# Patient Record
Sex: Male | Born: 1950 | Race: White | Hispanic: No | Marital: Married | Smoking: Current every day smoker
Health system: Southern US, Community
[De-identification: ages and names within clinical notes are randomized; demographics above are authoritative.]

## PROBLEM LIST (undated history)

## (undated) DIAGNOSIS — K449 Diaphragmatic hernia without obstruction or gangrene: Secondary | ICD-10-CM

## (undated) DIAGNOSIS — C4490 Unspecified malignant neoplasm of skin, unspecified: Secondary | ICD-10-CM

## (undated) DIAGNOSIS — K219 Gastro-esophageal reflux disease without esophagitis: Secondary | ICD-10-CM

## (undated) DIAGNOSIS — R911 Solitary pulmonary nodule: Secondary | ICD-10-CM

## (undated) DIAGNOSIS — E785 Hyperlipidemia, unspecified: Secondary | ICD-10-CM

## (undated) DIAGNOSIS — I714 Abdominal aortic aneurysm, without rupture, unspecified: Secondary | ICD-10-CM

## (undated) DIAGNOSIS — I10 Essential (primary) hypertension: Secondary | ICD-10-CM

## (undated) DIAGNOSIS — N182 Chronic kidney disease, stage 2 (mild): Secondary | ICD-10-CM

## (undated) DIAGNOSIS — K2270 Barrett's esophagus without dysplasia: Secondary | ICD-10-CM

## (undated) DIAGNOSIS — K5790 Diverticulosis of intestine, part unspecified, without perforation or abscess without bleeding: Secondary | ICD-10-CM

## (undated) DIAGNOSIS — IMO0002 Reserved for concepts with insufficient information to code with codable children: Secondary | ICD-10-CM

## (undated) DIAGNOSIS — M436 Torticollis: Secondary | ICD-10-CM

## (undated) DIAGNOSIS — M549 Dorsalgia, unspecified: Secondary | ICD-10-CM

## (undated) DIAGNOSIS — J439 Emphysema, unspecified: Secondary | ICD-10-CM

## (undated) DIAGNOSIS — Z720 Tobacco use: Secondary | ICD-10-CM

## (undated) HISTORY — DX: Barrett's esophagus without dysplasia: K22.70

## (undated) HISTORY — DX: Chronic kidney disease, stage 2 (mild): N18.2

## (undated) HISTORY — DX: Solitary pulmonary nodule: R91.1

## (undated) HISTORY — DX: Diaphragmatic hernia without obstruction or gangrene: K44.9

## (undated) HISTORY — DX: Abdominal aortic aneurysm, without rupture: I71.4

## (undated) HISTORY — DX: Unspecified malignant neoplasm of skin, unspecified: C44.90

## (undated) HISTORY — DX: Diverticulosis of intestine, part unspecified, without perforation or abscess without bleeding: K57.90

## (undated) HISTORY — PX: BACK SURGERY: SHX140

## (undated) HISTORY — DX: Dorsalgia, unspecified: M54.9

## (undated) HISTORY — DX: Abdominal aortic aneurysm, without rupture, unspecified: I71.40

## (undated) HISTORY — PX: APPENDECTOMY: SHX54

## (undated) HISTORY — DX: Tobacco use: Z72.0

## (undated) HISTORY — DX: Essential (primary) hypertension: I10

## (undated) HISTORY — PX: SPINAL FUSION: SHX223

## (undated) HISTORY — DX: Hyperlipidemia, unspecified: E78.5

## (undated) HISTORY — DX: Reserved for concepts with insufficient information to code with codable children: IMO0002

## (undated) HISTORY — DX: Emphysema, unspecified: J43.9

---

## 1998-08-15 ENCOUNTER — Encounter: Payer: Self-pay | Admitting: Emergency Medicine

## 1998-08-15 ENCOUNTER — Emergency Department (HOSPITAL_COMMUNITY): Admission: EM | Admit: 1998-08-15 | Discharge: 1998-08-15 | Payer: Self-pay | Admitting: Emergency Medicine

## 1999-10-04 ENCOUNTER — Emergency Department (HOSPITAL_COMMUNITY): Admission: EM | Admit: 1999-10-04 | Discharge: 1999-10-04 | Payer: Self-pay | Admitting: Emergency Medicine

## 1999-10-05 ENCOUNTER — Emergency Department (HOSPITAL_COMMUNITY): Admission: EM | Admit: 1999-10-05 | Discharge: 1999-10-05 | Payer: Self-pay | Admitting: Emergency Medicine

## 1999-10-10 ENCOUNTER — Emergency Department (HOSPITAL_COMMUNITY): Admission: EM | Admit: 1999-10-10 | Discharge: 1999-10-10 | Payer: Self-pay | Admitting: Emergency Medicine

## 1999-10-10 ENCOUNTER — Encounter: Payer: Self-pay | Admitting: Emergency Medicine

## 1999-12-21 ENCOUNTER — Observation Stay (HOSPITAL_COMMUNITY): Admission: EM | Admit: 1999-12-21 | Discharge: 1999-12-21 | Payer: Self-pay | Admitting: Emergency Medicine

## 1999-12-21 ENCOUNTER — Encounter: Payer: Self-pay | Admitting: *Deleted

## 1999-12-28 ENCOUNTER — Encounter: Admission: RE | Admit: 1999-12-28 | Discharge: 1999-12-28 | Payer: Self-pay | Admitting: Family Medicine

## 2000-01-03 ENCOUNTER — Encounter: Admission: RE | Admit: 2000-01-03 | Discharge: 2000-01-03 | Payer: Self-pay | Admitting: Family Medicine

## 2000-02-19 ENCOUNTER — Ambulatory Visit (HOSPITAL_COMMUNITY): Admission: RE | Admit: 2000-02-19 | Discharge: 2000-02-19 | Payer: Self-pay | Admitting: Sports Medicine

## 2000-03-15 ENCOUNTER — Encounter: Admission: RE | Admit: 2000-03-15 | Discharge: 2000-03-15 | Payer: Self-pay | Admitting: Family Medicine

## 2000-03-25 ENCOUNTER — Ambulatory Visit (HOSPITAL_COMMUNITY): Admission: RE | Admit: 2000-03-25 | Discharge: 2000-03-25 | Payer: Self-pay | Admitting: *Deleted

## 2000-04-18 ENCOUNTER — Encounter: Admission: RE | Admit: 2000-04-18 | Discharge: 2000-04-18 | Payer: Self-pay | Admitting: Family Medicine

## 2000-05-10 ENCOUNTER — Emergency Department (HOSPITAL_COMMUNITY): Admission: EM | Admit: 2000-05-10 | Discharge: 2000-05-10 | Payer: Self-pay | Admitting: Podiatry

## 2000-05-10 ENCOUNTER — Encounter: Payer: Self-pay | Admitting: *Deleted

## 2000-05-13 ENCOUNTER — Encounter: Admission: RE | Admit: 2000-05-13 | Discharge: 2000-05-13 | Payer: Self-pay | Admitting: Sports Medicine

## 2000-05-13 ENCOUNTER — Encounter: Admission: RE | Admit: 2000-05-13 | Discharge: 2000-05-13 | Payer: Self-pay | Admitting: Family Medicine

## 2000-05-13 ENCOUNTER — Encounter: Payer: Self-pay | Admitting: Sports Medicine

## 2001-03-04 ENCOUNTER — Encounter: Admission: RE | Admit: 2001-03-04 | Discharge: 2001-03-04 | Payer: Self-pay | Admitting: *Deleted

## 2001-06-04 ENCOUNTER — Encounter: Payer: Self-pay | Admitting: Neurosurgery

## 2001-06-04 ENCOUNTER — Encounter: Admission: RE | Admit: 2001-06-04 | Discharge: 2001-06-04 | Payer: Self-pay | Admitting: Neurosurgery

## 2001-06-18 ENCOUNTER — Encounter: Admission: RE | Admit: 2001-06-18 | Discharge: 2001-06-18 | Payer: Self-pay | Admitting: Neurosurgery

## 2001-06-18 ENCOUNTER — Encounter: Payer: Self-pay | Admitting: Neurosurgery

## 2001-07-07 ENCOUNTER — Encounter: Payer: Self-pay | Admitting: Emergency Medicine

## 2001-07-07 ENCOUNTER — Emergency Department (HOSPITAL_COMMUNITY): Admission: EM | Admit: 2001-07-07 | Discharge: 2001-07-08 | Payer: Self-pay | Admitting: Emergency Medicine

## 2001-09-24 ENCOUNTER — Ambulatory Visit (HOSPITAL_COMMUNITY): Admission: RE | Admit: 2001-09-24 | Discharge: 2001-09-24 | Payer: Self-pay | Admitting: Internal Medicine

## 2001-12-30 ENCOUNTER — Encounter: Payer: Self-pay | Admitting: Emergency Medicine

## 2001-12-30 ENCOUNTER — Emergency Department (HOSPITAL_COMMUNITY): Admission: EM | Admit: 2001-12-30 | Discharge: 2001-12-30 | Payer: Self-pay | Admitting: Emergency Medicine

## 2002-01-13 ENCOUNTER — Emergency Department (HOSPITAL_COMMUNITY): Admission: EM | Admit: 2002-01-13 | Discharge: 2002-01-13 | Payer: Self-pay | Admitting: Emergency Medicine

## 2002-04-06 ENCOUNTER — Ambulatory Visit (HOSPITAL_COMMUNITY): Admission: RE | Admit: 2002-04-06 | Discharge: 2002-04-06 | Payer: Self-pay | Admitting: Internal Medicine

## 2002-04-30 ENCOUNTER — Emergency Department (HOSPITAL_COMMUNITY): Admission: EM | Admit: 2002-04-30 | Discharge: 2002-04-30 | Payer: Self-pay

## 2002-04-30 ENCOUNTER — Encounter: Payer: Self-pay | Admitting: Emergency Medicine

## 2002-07-07 ENCOUNTER — Emergency Department (HOSPITAL_COMMUNITY): Admission: EM | Admit: 2002-07-07 | Discharge: 2002-07-08 | Payer: Self-pay | Admitting: Emergency Medicine

## 2002-07-08 ENCOUNTER — Encounter: Payer: Self-pay | Admitting: Emergency Medicine

## 2002-07-13 ENCOUNTER — Encounter: Admission: RE | Admit: 2002-07-13 | Discharge: 2002-07-13 | Payer: Self-pay | Admitting: Internal Medicine

## 2002-08-13 ENCOUNTER — Emergency Department (HOSPITAL_COMMUNITY): Admission: EM | Admit: 2002-08-13 | Discharge: 2002-08-13 | Payer: Self-pay | Admitting: Emergency Medicine

## 2002-08-13 ENCOUNTER — Encounter: Payer: Self-pay | Admitting: Emergency Medicine

## 2002-09-14 ENCOUNTER — Inpatient Hospital Stay (HOSPITAL_COMMUNITY): Admission: RE | Admit: 2002-09-14 | Discharge: 2002-09-20 | Payer: Self-pay | Admitting: Neurosurgery

## 2003-02-22 ENCOUNTER — Ambulatory Visit (HOSPITAL_COMMUNITY): Admission: RE | Admit: 2003-02-22 | Discharge: 2003-02-22 | Payer: Self-pay | Admitting: Neurosurgery

## 2003-08-27 ENCOUNTER — Emergency Department (HOSPITAL_COMMUNITY): Admission: EM | Admit: 2003-08-27 | Discharge: 2003-08-28 | Payer: Self-pay | Admitting: Emergency Medicine

## 2003-10-12 ENCOUNTER — Ambulatory Visit (HOSPITAL_COMMUNITY): Admission: RE | Admit: 2003-10-12 | Discharge: 2003-10-12 | Payer: Self-pay | Admitting: Neurosurgery

## 2003-11-10 ENCOUNTER — Encounter: Admission: RE | Admit: 2003-11-10 | Discharge: 2003-11-10 | Payer: Self-pay | Admitting: Neurosurgery

## 2003-12-15 ENCOUNTER — Ambulatory Visit: Payer: Self-pay | Admitting: Gastroenterology

## 2003-12-27 ENCOUNTER — Ambulatory Visit: Payer: Self-pay | Admitting: *Deleted

## 2003-12-27 ENCOUNTER — Ambulatory Visit: Payer: Self-pay | Admitting: Gastroenterology

## 2004-01-04 ENCOUNTER — Ambulatory Visit: Payer: Self-pay

## 2004-02-07 ENCOUNTER — Ambulatory Visit: Payer: Self-pay | Admitting: *Deleted

## 2004-12-13 ENCOUNTER — Ambulatory Visit (HOSPITAL_COMMUNITY): Admission: RE | Admit: 2004-12-13 | Discharge: 2004-12-13 | Payer: Self-pay | Admitting: Neurosurgery

## 2006-11-15 ENCOUNTER — Emergency Department (HOSPITAL_COMMUNITY): Admission: EM | Admit: 2006-11-15 | Discharge: 2006-11-15 | Payer: Self-pay | Admitting: Emergency Medicine

## 2006-12-16 ENCOUNTER — Emergency Department (HOSPITAL_COMMUNITY): Admission: EM | Admit: 2006-12-16 | Discharge: 2006-12-16 | Payer: Self-pay | Admitting: Emergency Medicine

## 2007-10-29 ENCOUNTER — Emergency Department (HOSPITAL_COMMUNITY): Admission: EM | Admit: 2007-10-29 | Discharge: 2007-10-29 | Payer: Self-pay | Admitting: Emergency Medicine

## 2008-09-02 ENCOUNTER — Inpatient Hospital Stay (HOSPITAL_COMMUNITY): Admission: RE | Admit: 2008-09-02 | Discharge: 2008-09-06 | Payer: Self-pay | Admitting: Neurosurgery

## 2009-01-20 ENCOUNTER — Encounter: Admission: RE | Admit: 2009-01-20 | Discharge: 2009-01-20 | Payer: Self-pay | Admitting: Neurosurgery

## 2009-12-12 ENCOUNTER — Emergency Department (HOSPITAL_COMMUNITY): Admission: EM | Admit: 2009-12-12 | Discharge: 2009-12-12 | Payer: Self-pay | Admitting: Emergency Medicine

## 2010-01-19 ENCOUNTER — Emergency Department (HOSPITAL_COMMUNITY)
Admission: EM | Admit: 2010-01-19 | Discharge: 2010-01-20 | Payer: Self-pay | Source: Home / Self Care | Admitting: Emergency Medicine

## 2010-02-11 ENCOUNTER — Encounter: Payer: Self-pay | Admitting: *Deleted

## 2010-04-29 LAB — DIFFERENTIAL
Lymphocytes Relative: 10 % — ABNORMAL LOW (ref 12–46)
Lymphs Abs: 1 10*3/uL (ref 0.7–4.0)
Monocytes Absolute: 0.9 10*3/uL (ref 0.1–1.0)
Monocytes Relative: 8 % (ref 3–12)
Neutro Abs: 8.2 10*3/uL — ABNORMAL HIGH (ref 1.7–7.7)

## 2010-04-29 LAB — CBC
HCT: 32.5 % — ABNORMAL LOW (ref 39.0–52.0)
HCT: 44.2 % (ref 39.0–52.0)
Hemoglobin: 15.3 g/dL (ref 13.0–17.0)
Hemoglobin: 9.4 g/dL — ABNORMAL LOW (ref 13.0–17.0)
MCHC: 34.5 g/dL (ref 30.0–36.0)
MCHC: 35.6 g/dL (ref 30.0–36.0)
MCV: 89.9 fL (ref 78.0–100.0)
MCV: 91.7 fL (ref 78.0–100.0)
Platelets: 196 10*3/uL (ref 150–400)
Platelets: 240 10*3/uL (ref 150–400)
RBC: 2.92 MIL/uL — ABNORMAL LOW (ref 4.22–5.81)
RBC: 4.82 MIL/uL (ref 4.22–5.81)
RDW: 12.8 % (ref 11.5–15.5)
RDW: 13.4 % (ref 11.5–15.5)
WBC: 10.3 10*3/uL (ref 4.0–10.5)
WBC: 9.9 10*3/uL (ref 4.0–10.5)

## 2010-04-29 LAB — TYPE AND SCREEN
ABO/RH(D): A NEG
Antibody Screen: NEGATIVE

## 2010-04-29 LAB — URINALYSIS, ROUTINE W REFLEX MICROSCOPIC
Bilirubin Urine: NEGATIVE
Ketones, ur: 40 mg/dL — AB
Nitrite: NEGATIVE
pH: 6 (ref 5.0–8.0)

## 2010-04-29 LAB — BASIC METABOLIC PANEL
BUN: 7 mg/dL (ref 6–23)
BUN: 9 mg/dL (ref 6–23)
CO2: 29 mEq/L (ref 19–32)
CO2: 30 mEq/L (ref 19–32)
Calcium: 9.4 mg/dL (ref 8.4–10.5)
Chloride: 100 mEq/L (ref 96–112)
Chloride: 105 mEq/L (ref 96–112)
Creatinine, Ser: 1.12 mg/dL (ref 0.4–1.5)
GFR calc Af Amer: >60 mL/min (ref >60)
GFR calc non Af Amer: >60 mL/min (ref >60)
Glucose, Bld: 113 mg/dL — ABNORMAL HIGH (ref 70–99)
Glucose, Bld: 126 mg/dL — ABNORMAL HIGH (ref 70–99)
Potassium: 3.8 mEq/L (ref 3.5–5.1)
Potassium: 4.5 mEq/L (ref 3.5–5.1)
Sodium: 141 mEq/L (ref 135–145)

## 2010-04-29 LAB — ABO/RH: ABO/RH(D): A NEG

## 2010-04-29 LAB — URINE MICROSCOPIC-ADD ON

## 2010-05-12 ENCOUNTER — Other Ambulatory Visit: Payer: Self-pay | Admitting: Anesthesiology

## 2010-05-12 DIAGNOSIS — M545 Low back pain: Secondary | ICD-10-CM

## 2010-05-16 ENCOUNTER — Ambulatory Visit
Admission: RE | Admit: 2010-05-16 | Discharge: 2010-05-16 | Disposition: A | Discharge status: Home / Self Care | Payer: Medicaid Other | Source: Ambulatory Visit | Attending: Anesthesiology | Admitting: Anesthesiology

## 2010-05-16 DIAGNOSIS — M545 Low back pain: Secondary | ICD-10-CM

## 2010-05-16 MED ORDER — GADOBENATE DIMEGLUMINE 529 MG/ML IV SOLN
16.0000 mL | Freq: Once | INTRAVENOUS | Status: AC | PRN
  Administered 2010-05-16: 16 mL via INTRAVENOUS

## 2010-05-25 ENCOUNTER — Encounter (INDEPENDENT_AMBULATORY_CARE_PROVIDER_SITE_OTHER): Payer: Medicaid Other | Admitting: Vascular Surgery

## 2010-05-25 ENCOUNTER — Encounter (INDEPENDENT_AMBULATORY_CARE_PROVIDER_SITE_OTHER): Payer: Medicaid Other

## 2010-05-25 ENCOUNTER — Other Ambulatory Visit: Payer: Self-pay | Admitting: Vascular Surgery

## 2010-05-25 DIAGNOSIS — I714 Abdominal aortic aneurysm, without rupture: Secondary | ICD-10-CM

## 2010-05-26 NOTE — Assessment & Plan Note (Signed)
OFFICE VISIT  BERN, FARE DOB:  [birth year]                                       05/25/2010 TKZSW#:[id number]  CHIEF COMPLAINT:  Abdominal aortic aneurysm.  PRESENT ILLNESS:  The patient is a 60 year old male with a history of chronic back pain.  He apparently underwent lumbar surgery 2 years ago but did not really have any improvement in his symptoms.  His back pain has been unchanged and has been severe enough that he has had some change in his gait as well.  He recently had an MRI scan performed for evaluation of his back pain and this showed an abdominal aortic aneurysm.  He has not had any abdominal pain.  He has no family history of abdominal aortic aneurysm.  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  These problems are currently controlled and followed by Dr. Concepcion Elk.  PAST SURGICAL HISTORY:  Appendectomy, spinal fusion of the neck and lower back.  SOCIAL HISTORY:  He is married.  He has 5 children.  He smokes 1/4 pack of cigarettes per day.  He does not consume alcohol regularly.  FAMILY HISTORY:  Mother had atrial fibrillation.  Father had a stroke.  REVIEW OF SYSTEMS:  He is 5 feet 8 inches, 170 pounds. CARDIAC:  He has shortness of breath at walking less than 2 flights of stairs.  He previously has been evaluated by [facility] for episodes of chest pain.  This was thought to be anxiety in the past. GI:  He has a history of hiatal hernia and constipation. MUSCULOSKELETAL:  He has multiple joint arthritis pain as mentioned above. PSYCHIATRIC:  He has mild anxiety and occasionally has panic attacks. Skin, urinary, hematologic, pulmonary, neurologic, ENT, vascular review of systems were otherwise all negative.  MEDICATIONS: 1. Albuterol p.r.n. 2. Aspirin 81 mg once a day. 3. Cozaar 50 mg once a day. 4. Fluoxetine 40 mg once a day. 5. Lidoderm patch  q.12 hours. 6. Neurontin 400 mg 4 times a day. 7. Nexium 40 mg once a day. 8. Omega Fish Oil twice a day. 9. Valium 5 mg b.i.d. p.r.n. anxiety. 10.MS Contin 30 mg extended release tab 1 tablet 3 times a day.  ALLERGIES:  Lotensin and Prevacid.  The allergic reaction was shortness of breath.  PHYSICAL EXAM:  Vital signs:  Blood pressure 130/87 in the left arm, heart rate 62 and regular, oxygen saturations 99% on room air, respirations 12.  HEENT:  Unremarkable.  Neck:  Has 2+ carotid pulses without bruit.  Chest:  Clear to auscultation.  Cardiac:  Regular rate and rhythm without murmur.  Abdomen:  Soft, nontender, nondistended.  No obvious palpable pulsatile mass.  His abdomen is fairly tense.  He has a well-healed right paramedian scar.  Extremities:  He has 2+ femoral, 3+ popliteal, 2+ dorsalis pedis and posterior tibial pulses bilaterally. Neurological:  Exam shows symmetric upper extremity and lower extremity motor strength which is 5/5 bilaterally.  Skin:  Has no open ulcers or rashes.  I reviewed his MRI scan which shows a 4.9 x 5.2 cm abdominal aortic aneurysm.  This was not a dedicated scan for looking at the abdominal aorta and only portions of this are included.  In summary, the patient has an asymptomatic 5.3 cm abdominal aortic aneurysm.  I believe that this is  large enough to warrant repair especially in light of his age.  In planning purposes for this we will have him seen by [facility] for cardiac risk stratification. Additionally, we will obtain a CT angiogram of the abdomen and pelvis to further identify the abdominal aortic aneurysm as well as the iliac artery for possible aneurysm.  We will also obtain with that CTA of the abdomen and pelvis runoff views of the lower extremities as his popliteal pulses were quite full and I believe he possibly also has bilateral popliteal aneurysms.  He will return for further followup after his CT angiogram and review by  [facility].  We will then consider at that point whether or not to repair his abdominal aortic aneurysm.  Pathophysiology of abdominal aortic aneurysms was discussed at length today with the patient and his wife.    Janetta Hora. Kaizen Ibsen, MD Electronically Signed  CEF/MEDQ  D:  05/25/2010  T:  05/26/2010  Job:  [id number]  cc:   Loraine Leriche L. Vear Clock, M.D. Fleet Contras, M.D. [place] Card Wynn Banker, MD

## 2010-05-30 NOTE — Procedures (Unsigned)
DUPLEX ULTRASOUND OF ABDOMINAL AORTA  INDICATION:  Abdominal aortic aneurysm.  HISTORY: Diabetes:  No. Cardiac: Hypertension:  Yes. Smoking:  Yes. Connective Tissue Disorder: Family History:  Unknown. Previous Surgery:  No.  DUPLEX EXAM:         AP (cm)                   TRANSVERSE (cm) Proximal             2.29 cm                   2.28 cm Mid                  4.74 cm                   5.73 cm Distal               3.08 cm                   3.63 cm Right Iliac          1.20 cm                   1.13 cm Left Iliac           1.11 cm                   1.20 cm  PREVIOUS:  Date: MRI done 04/2010  AP:  4.9  TRANSVERSE:  5.2  IMPRESSION: 1. Abdominal aortic aneurysm present without intramural thrombus,     measuring 4.74 cm X 5.73 cm. 2. Noted slightly larger in diameter than on recently performed MRI     approximately 2 weeks ago.  ___________________________________________ Janetta Hora. Fields, MD  SH/MEDQ  D:  05/25/2010  T:  05/25/2010  Job:  [id number]

## 2010-05-31 ENCOUNTER — Encounter: Payer: Self-pay | Admitting: Cardiovascular Disease

## 2010-05-31 ENCOUNTER — Encounter: Payer: Self-pay | Admitting: *Deleted

## 2010-06-01 ENCOUNTER — Ambulatory Visit (INDEPENDENT_AMBULATORY_CARE_PROVIDER_SITE_OTHER): Payer: Medicaid Other | Admitting: Cardiovascular Disease

## 2010-06-01 ENCOUNTER — Encounter: Payer: Self-pay | Admitting: Cardiovascular Disease

## 2010-06-01 VITALS — BP 122/84 | HR 51 | Resp 18 | Ht 67.0 in | Wt 164.0 lb

## 2010-06-01 DIAGNOSIS — F17210 Nicotine dependence, cigarettes, uncomplicated: Secondary | ICD-10-CM | POA: Insufficient documentation

## 2010-06-01 DIAGNOSIS — Z01810 Encounter for preprocedural cardiovascular examination: Secondary | ICD-10-CM | POA: Insufficient documentation

## 2010-06-01 DIAGNOSIS — I10 Essential (primary) hypertension: Secondary | ICD-10-CM | POA: Insufficient documentation

## 2010-06-01 DIAGNOSIS — F17200 Nicotine dependence, unspecified, uncomplicated: Secondary | ICD-10-CM

## 2010-06-01 DIAGNOSIS — I714 Abdominal aortic aneurysm, without rupture: Secondary | ICD-10-CM

## 2010-06-01 NOTE — Assessment & Plan Note (Signed)
Asymptomatic, normal ECG and normal cardiac exam.  Clear for surgery

## 2010-06-01 NOTE — Progress Notes (Signed)
60 yo referred by Dr Darrick Penna for preop clearance.  Has a 5.3 cm AAA.  Smokes less than a ppd.  Has longstanding HTN well Rx.  Has exertional angina with no SSCP, palpiations , syncope or edema.  Chronic back pain.  AAA initially seen on MRI done for back.  No history of CAD, CHF or valve disease.  No previous problems with anesthesia during cervical spine surgery.  Limited activity due to back pain but no SSCP.  He is not sure if surgery will be open or stent graft.  Counseled for less than 10 minutes on smoking cessation and risk of recurrent vascular disease and cancer.  With normal ECG, no symptoms and normal cardiac exam there is no indication for diagnostic testing to clear for surgery.  Will send copy of letter to Dr Darrick Penna indicating he is ok to proceed with either open or stent graft surgery  ROS: Denies fever, malais, weight loss, blurry vision, decreased visual acuity, cough, sputum, SOB, hemoptysis, pleuritic pain, palpitaitons, heartburn, abdominal pain, melena, lower extremity edema, claudication, or rash.   General: Affect appropriate Healthy:  appears stated age HEENT: normal Neck supple with no adenopathy JVP normal no bruits no thyromegaly Lungs clear with no wheezing and good diaphragmatic motion Heart:  S1/S2 no murmur,rub, gallop or click PMI normal Abdomen: benighn, BS positve, no tenderness, no AAA no bruit.  No HSM or HJR Distal pulses intact with no bruits No edema Neuro non-focal Skin warm and dry No muscular weakness  Medications Current Outpatient Prescriptions  Medication Sig Dispense Refill  . albuterol (PROVENTIL) (2.5 MG/3ML) 0.083% nebulizer solution Take 2.5 mg by nebulization every 6 (six) hours as needed.        Marland Kitchen aspirin 81 MG tablet Take 81 mg by mouth daily.        . diazepam (VALIUM) 5 MG tablet Take 5 mg by mouth every 6 (six) hours as needed.        Marland Kitchen esomeprazole (NEXIUM) 40 MG capsule Take 40 mg by mouth daily before breakfast.        .  gabapentin (NEURONTIN) 400 MG tablet Take 400 mg by mouth 4 (four) times daily.        . Lidocaine (LIDODERM EX) Apply topically. Patch every 12 hrs       . losartan (COZAAR) 50 MG tablet Take 50 mg by mouth daily.        Marland Kitchen morphine (MSIR) 30 MG tablet Take 30 mg by mouth 3 (three) times daily.        Marland Kitchen DISCONTD: FLUoxetine (PROZAC) 40 MG capsule Take 40 mg by mouth daily.          Allergies Lotensin and Protonix  Family History: Family History  Problem Relation Age of Onset  . Atrial fibrillation Mother   . Stroke Father     Social History: History   Social History  . Marital Status: Legally Separated    Spouse Name: N/A    Number of Children: N/A  . Years of Education: N/A   Occupational History  . Not on file.   Social History Main Topics  . Smoking status: Current Everyday Smoker  . Smokeless tobacco: Not on file  . Alcohol Use: Not on file  . Drug Use: Not on file  . Sexually Active: Not on file   Other Topics Concern  . Not on file   Social History Narrative  . No narrative on file    Electrocardiogram:  Sinus brady 51  normal ECG  Assessment and Plan

## 2010-06-01 NOTE — Assessment & Plan Note (Signed)
Counseled for less than 10 minutes.  Would be a reasonable candidate for Chantix post op

## 2010-06-01 NOTE — Assessment & Plan Note (Signed)
Well controlled.  Continue current medications and low sodium Dash type diet.    

## 2010-06-06 NOTE — Discharge Summary (Signed)
[name], [name]                 ACCOUNT NO.:  [id number]   MEDICAL RECORD NO.:  [id number]          PATIENT TYPE:  INP   LOCATION:  3025                         FACILITY:  [facility]   PHYSICIAN:  Cristi Loron, M.D.DATE OF BIRTH:  [birth year]   DATE OF ADMISSION:  09/02/2008  DATE OF DISCHARGE:  09/06/2008                               DISCHARGE SUMMARY   BRIEF HISTORY:  The patient is a 60 year old white male who suffered  from back and leg pain.  He has failed medical management and was worked  up with a lumbar MRI and lumbar diskogram, which demonstrated the  patient had disk degeneration with concordant pain and stenosis at L4-5  and L5-S1.  I discussed the various treatment option with the patient  including surgery.  He has weighed the risks, benefits, and alternatives  of surgery, and decided to proceed with lumbar decompression,  instrumentation, and fusion.   For further details of this admission. please refer to the typed history  and physical.   HOSPITAL COURSE:  I admitted the patient to [facility] on  September 02, 2008.  On day of admission, I performed L4-5 and L5-S1  decompression, instrumentation and fusion.  The surgery went well (for  full details of this operation, please refer to the typed operative  note).   POSTOPERATIVE COURSE:  The patient's postoperative course was remarkable  only for fevers, the patient to 103.  The patient was worked up with  chest x-ray, which demonstrated only atelectasis.  Urinalysis which was  okay and his white count was normal at 10.3.  We observed the patient.  PT and OT saw the patient.  By September 06, 2008, the patient was afebrile  with a T-max of 100.9.  His vital signs were stable and he was  requesting to discharge home.   The patient was discharged home on September 06, 2008.   DISCHARGE INSTRUCTIONS:  The patient was given written discharge  instructions, and instructed to take his temperature and call if his  temperature gets above 101.5 or any other signs infection (his wife is a  Engineer, civil (consulting) and is comfortable to taking him home).   DISCHARGE PRESCRIPTIONS:  1. Percocet 10/325 #100 one p.o. q.4 h. p.r.n. for pain.  2. Valium 5 mg #50 one p.o. q.8 h. p.r.n. for muscle spasms.   FINAL DIAGNOSIS:  L4-5 and L5-S1 disk degeneration, lumbago, lumbar  radiculopathy.   PROCEDURE PERFORMED:  Bilateral L4 and L5 laminotomies and  foraminotomies to decompress the bilateral L4, L5, and S1 nerve roots  (the work required to do this.  Decompression was in excess.  The work  required to the posterior lumbar interbody fusion because of the  patient's severe facet arthropathy, stenosis etc.); L4-5 and L5-S1  posterior lumbar interbody fusion with local morselized autograft bone  and Actifuse bone graft extender; insertion  of L4-5 and L5-S1 interbody prosthesis (Capstone PEEK interbody  prosthesis); L4-L5 and L5-S1 posterior segmental instrumentation with  Legacy titanium pedicle screws and rods; L4-5 and L5-S1 posterolateral  arthrodesis with local morselized autograft bone and Vitoss bone  graft  extenders; atelectasis.      Cristi Loron, M.D.  Electronically Signed     JDJ/MEDQ  D:  09/06/2008  T:  09/06/2008  Job:  [id number]

## 2010-06-06 NOTE — Op Note (Signed)
NAMETREMONT, GAVITT                 ACCOUNT NO.:  [id number]   MEDICAL RECORD NO.:  [id number]          PATIENT TYPE:  INP   LOCATION:  3025                         FACILITY:  [facility]   PHYSICIAN:  Cristi Loron, M.D.DATE OF BIRTH:  [birth year]   DATE OF PROCEDURE:  09/02/2008  DATE OF DISCHARGE:                               OPERATIVE REPORT   BRIEF HISTORY:  The patient is a 60 year old white male who has suffered  from back and leg pain.  He failed medical management and was worked up  with a lumbar MRI and lumbar diskogram, which demonstrated that the  patient had disk degeneration with concordant pain and stenosis at L4-5  and L5-S1.  I discussed various treatment options with the patient  including surgery.  He has weighed the risks, benefits, and alternative  surgery, decided to proceed with a lumbar decompression instrumentation  and fusion.   PREOPERATIVE DIAGNOSES:  L4-5 and L5-S1 disk degeneration, lumbago,  lumbar radiculopathy.   POSTOPERATIVE DIAGNOSES:  L4-5 and L5-S1 disk degeneration, lumbago,  lumbar radiculopathy.   PROCEDURES:  Bilateral L4 and L5 laminotomies and foraminotomies to  decompress the bilateral L4, 5, and S1 nerve roots, (the work required  to do this decompression was in addition to the work required for the  posterior lumbar interbody fusion because of the patient's severe facet  arthropathy as well as his foraminal stenosis, etc.); L4-5 and L5-S1  posterior lumbar interbody fusion with local morselized autograft bone  and Actifuse bone graft extender; insertion of L4-5 and L5-S1 interbody  prosthesis (Capstone PEEK interbody prosthesis); L4-S1 posterior  segmental instrumentation with Legacy titanium pedicle screws and rods;  L4-5 and L5-S1 posterolateral arthrodesis with local morselized  autograft bone and Vitoss bone graft extender.   SURGEON:  Cristi Loron, MD   ASSISTANT:  Hewitt Shorts, MD   ANESTHESIA:  General  endotracheal.   ESTIMATED BLOOD LOSS:  300 mL.   SPECIMENS:  None.   DRAINS:  None.   COMPLICATIONS:  None.   DESCRIPTION OF PROCEDURE:  The patient was brought to the operating room  by the anesthesia team.  General endotracheal anesthesia was induced.  The patient was turned to the prone position on the Wilson frame.  His  lumbosacral region was then shaved with clippers and prepared with  Betadine scrub and Betadine solution.  Sterile drapes were applied.  I  then injected the area to be incised with Marcaine with epinephrine  solution.  I used a scalpel to make a linear midline incision over the  L4-5 and L5-S1 interspaces.  I used electrocautery to perform a  bilateral subperiosteal dissection exposing spinous process and lamina  of the L3, 4, 5 in the upper sacrum.  We obtained intraoperative  radiograph to confirm our location.  We then inserted the Versa-Trac  retractor for exposure.   We began the decompression by performing bilateral L4 and L5  laminotomies with a high-speed drill.  We widened these laminotomies  with Kerrison punch, removing the L4-5 and L5-S1 ligamentum flavum.  We  removed the medial aspect of the  L4-5 and L5-S1 facets.  We performed  wide foraminotomies about the bilateral L4, L5, and S1 nerve roots  completing the decompression.   We now turned our attention to the posterior lumbar interbody fusion.  We incised the L4-5 and L5-S1 intervertebral disks bilaterally and  performed a partial intervertebral diskectomy with the pituitary  forceps.  We then prepared the vertebral endplates for the fusion by  using curettes clearing soft tissues.  We used a trial spacers and  determined to use a 12 x 26 mm Capstone PEEK interbody prosthesis at L4-  5 and 10 x 26 bilaterally at L5-S1.  We prefilled these prostheses with  combination of local morselized autograft bone.  We obtained  decompression as well as Actifuse bone graft extenders.  We then  inserted  these prostheses into the L4-5 and L5-S1 interspaces, of course  after retracting the neural structures out of harm's way.  There was a  good snug fit of the prosthesis.  We filled the remainder of the disk  space with local morselized autograft bone and Actifuse bone graft  extender completing the posterior lumbar interbody fusion.   We now turned our attention to instrumentation.  Under fluoroscopic  guidance, we cannulated the bilateral L4, L5, and S1 pedicles with the  bone probe.  We tapped the pedicles with a 6.5-mm tap, we then removed  the tap, and then probed inside the tapped pedicle to rule out cortical  breeches.  We then inserted 7.5 x 55 mm pedicles bilaterally at L4-L5  and 7.5 x 50 bilaterally at S1.  We got good bony purchase.  We then  palpated along the medial aspect of the pedicles and noted there was no  cortical breeches.  We then connected the unilateral pedicle screws with  lordotic rod, we compressed to construct, and then secured the rod in  place with the caps, which we tightened appropriately.  This completed  the instrumentation.   We now turned our attention to the posterolateral arthrodesis.  We used  a high-speed drill to decorticate the remainder of the L4 and L5  transverse processes as well as remainder of L4-L5 and L5-S1 facets at  the upper sacrum, etc.  We then laid a combination of local morselized  autograft bone and Vitoss bone graft extenders over these decorticated  posterolateral structures completing the posterolateral arthrodesis.  We  then obtained hemostasis using bipolar cautery.  We irrigated the wound  out with bacitracin solution.  We then inspected thecal sac and the  bilateral L4, 5, S1 nerve roots for any compression.  We then removed  the retractors and then reapproximated the patient's thoracolumbar  fascia with interrupted #1 Vicryl suture, subcutaneous tissue with  interrupted 2-0 Vicryl suture, and the skin with Steri-Strips  and  Benzoin.  The wound was then coated with bacitracin ointment and sterile  dressing applied.  The drapes were removed.  The patient was  subsequently returned to supine position where he was extubated by the  anesthesia team and transported to the post anesthesia care unit in  stable condition.  All sponge, instrument, and needle counts were  correct at the end of this case.      Cristi Loron, M.D.  Electronically Signed     JDJ/MEDQ  D:  09/02/2008  T:  09/03/2008  Job:  [id number]

## 2010-06-08 ENCOUNTER — Ambulatory Visit (INDEPENDENT_AMBULATORY_CARE_PROVIDER_SITE_OTHER): Payer: Medicaid Other | Admitting: Vascular Surgery

## 2010-06-08 ENCOUNTER — Ambulatory Visit
Admission: RE | Admit: 2010-06-08 | Discharge: 2010-06-08 | Disposition: A | Discharge status: Home / Self Care | Payer: Medicaid Other | Source: Ambulatory Visit | Attending: Vascular Surgery | Admitting: Vascular Surgery

## 2010-06-08 ENCOUNTER — Other Ambulatory Visit (INDEPENDENT_AMBULATORY_CARE_PROVIDER_SITE_OTHER): Payer: Medicaid Other

## 2010-06-08 DIAGNOSIS — I714 Abdominal aortic aneurysm, without rupture: Secondary | ICD-10-CM

## 2010-06-08 DIAGNOSIS — I739 Peripheral vascular disease, unspecified: Secondary | ICD-10-CM

## 2010-06-08 MED ORDER — IOHEXOL 350 MG/ML SOLN
150.0000 mL | Freq: Once | INTRAVENOUS | Status: AC | PRN
  Administered 2010-06-08: 150 mL via INTRAVENOUS

## 2010-06-09 NOTE — Discharge Summary (Signed)
   [name], [name]                           ACCOUNT NO.:  [id number]   MEDICAL RECORD NO.:  [id number]                   PATIENT TYPE:  INP   LOCATION:  3014                                 FACILITY:  [facility]   PHYSICIAN:  Payton Doughty, M.D.                   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  2. Cervical myelopathy.  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  4. __________ evagination.  5. Cervical spondylosis.   PROCEDURES:  1. Occiput to C1, C2-3, C4-5, C4, and C5 posterior cervical fusion.  2. Posterior segmental instrumentation off occiput to C5 reverse hex screws     and rods.   COMPLICATIONS:  None.   DISCHARGE STATUS:  Alive and well.   HISTORY OF PRESENT ILLNESS:  This is a 60 year old gentleman whose history  and physical is recounted in the chart.  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  He was  admitted for fusion.   PHYSICAL EXAMINATION:  His general exam was intact.  The neurologic exam was  remarkable for cervical myelopathy.   HOSPITAL COURSE:  He was admitted after ascertainment of normal laboratory  values and underwent a posterior cervical fusion from the occiput to C5.  Postoperatively he has done relatively well.  He spent a couple of nights in  the ICU and has been up and about with rehabilitation.  He had one episode  of desaturation which was not associated with leg pain or chest pain.  He  had some crackles in his chest and it resolved spontaneously with the use of  incentive spirometer.  On exam, his upper extremity strength is good.  His  incision is healing well.  He is going to be discharged.   FOLLOWUP:  Followup will be in the [facility] office  in about two weeks with Dr. Lovell Barnes.   DISCHARGE MEDICATIONS:  He is  being discharged on Percocet and Valium for  pain.                                                Payton Doughty, M.D.   MWR/MEDQ  D:  09/20/2002  T:  09/21/2002  Job:  [id number]

## 2010-06-09 NOTE — Op Note (Signed)
NAME:  [name], [name]                           ACCOUNT NO.:  [id number]   MEDICAL RECORD NO.:  [id number]                   PATIENT TYPE:  INP   LOCATION:  3106                                 FACILITY:  [facility]   PHYSICIAN:  Cristi Loron, M.D.            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  2. Cervical myelopathy.  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  4. Basilar invagination.  5. Cervical spondylosis and stenosis.   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  2. Cervical myelopathy.  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  4. Basilar invagination.  5. Cervical spondylosis and stenosis.   PROCEDURES:  1. Occipital-cervical fusion, occiput to C1.  2. C2-3, C3-4, C4-5 posterior cervical fusion.  3. Harvesting of iliac crest autograft bone.  4. Posterior segmental instrumentation, occiput to C5, with Vertex screws     and rods.   SURGEON:  Cristi Loron, M.D.   ASSISTANT:  Danae Orleans. Venetia Maxon, M.D.   ANESTHESIA:  General endotracheal.   ESTIMATED BLOOD LOSS:  300 mL.   SPECIMENS:  None.   DRAINS:  None.   COMPLICATIONS:  None.   BRIEF HISTORY:  The patient is a 60 year old white male who has suffered  from neck pain as well as bilateral arm pain, numbness, and tingling.  He  was worked up with cervical x-rays, which demonstrated instability at C1-2.  He subsequently was worked up with a cervical MRI, which demonstrated a C2-3  Klippel-Feil deformity with basilar invagination and compression of the  spinal cord at the cervical-medullary junction.  I recommended that the  patient undergo an occipital-cervical fusion.  The patient weighed the  risks, benefits, and alternatives of surgery and decided to proceed with the  operation.   DESCRIPTION OF PROCEDURE:  The patient was brought to the operating room by  the anesthesia  team.  General endotracheal anesthesia was induced.  The  Mayfield three-point head rest was then applied to the patient's calvarium.  He was carefully turned to the prone position with relatively neutral  alignment.  His head was secured in the three-point head rest.  An  intraoperative radiograph was obtained and demonstrated good alignment.  The  patient's suboccipital region was then shaved and that region into the  posterior cervical region, upper thorax was then prepared with Betadine  scrub and Betadine solution.  Sterile drapes were applied.  I then injected  the area to be incised with Marcaine with epinephrine solution and used a  scalpel to make a linear midline incision from the occiput to the  midcervical spine.  I used electrocautery to dissect down to the cervical  fascia and divided the fascia bilaterally, performing a bilateral  subperiosteal dissection, stripping the paraspinous musculature from the  occiput and the spinous processes and  laminae down to C5.  I inserted the  cerebellar retractor for exposure.  Of note, the C2 and C3 looked abnormal.  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  Then we exposed the lateral aspect of the facet down to C5.  We  then inserted lateral mass screws bilaterally at C4 and C5 using the awl and  the drill and tapped in the standard cephalad and lateral trajectory.  The  screws were 14 mm.  Because of the dysmorphic features at C2 and C3 and the  fact that the vertebral artery on the CT scan appeared to be in a somewhat  precarious position, I did not attempt to place any screws in the lateral  masses at C2 or C3.  We then carefully passed sublaminar wires beneath the  arch of C1.  We placed two single Songer cable wires.  We then exposed the  superior nuchal line and we then bent the rod appropriately for an occipital  cervical construct and we then secured the rod into place by placing  three  cortical screws into the occiput at the superior nuchal line on each side.  The rod was contoured appropriately and then secured in place by placing  locking caps.  We then placed a cross-connector at approximately the arch of  C1 and then we secured the Songer cable to the cross-connector bilaterally  and sequentially tightened it and then crimped the connector and cut the  excess wire.  At this point we had a good construct down from the occiput  down to C5.   We then turned our attention to the fusion.  The patient's hip had  previously been prepped with Duraprep and then we injected the area to be  incised and then used a scalpel to make an incision over the iliac crest  posteriorly.  We exposed it with electrocautery and used a sterile bar  retractor for exposure and then used the gouges and curettes to obtain  adequate amounts of cancellous bone graft.  We then obtained hemostasis  using Gelfoam and then reapproximated the patient's fascia at the hip graft  site with interrupted 0 Vicryl suture, the subcutaneous tissue with  interrupted 2-0 Vicryl suture, and the skin with Steri-Strips and Benzoin.  We then used the high-speed drill to decorticate the suboccipital region as  well as the arch of C1, the lateral masses/facet at C2, C3, C4, and C5.  We  then laid the cancellous autograft bone over these decorticated surfaces and  then placed a BMP-soaked collagen sponge over the posterolateral structures,  completing the posterior lateral arthrodesis from the occiput down to C5.  We then achieved stringent hemostasis using bipolar electrocautery.  We then  removed the retractors and then reapproximated the patient's cervical fascia  with interrupted 0 Vicryl suture, the subcutaneous tissue with interrupted 2-  0 Vicryl suture, and the skin with a running 3-0 nylon suture.  The wound was then coated with bacitracin ointment, as was the iliac crest donor site,  and sterile  dressings were applied, the drapes were removed, and the patient  was carefully returned to the supine position, where the Mayfield three-  point head rest was removed from his calvarium and he was subsequently  extubated and transported to the postanesthesia care unit in stable  condition.  All sponge, needle, and instrument counts were correct at the  end of this case.  Cristi Loron, M.D.    JDJ/MEDQ  D:  09/14/2002  T:  09/15/2002  Job:  [id number]

## 2010-06-09 NOTE — Assessment & Plan Note (Signed)
OFFICE VISIT  [name], [name] DOB:  Oct 19, 1950                                       06/08/2010 ZOXWR#:[id number]  The patient returns for followup today.  We have been evaluating him for possible treatment of a 5 cm abdominal aortic aneurysm.  He returns today for further followup.  He had a CT angiogram of the abdomen and pelvis performed today.  I reviewed these images today which shows a 5.3 cm abdominal aortic aneurysm with a 3 cm neck, bilateral single renal arteries.  The neck is 19 mm in diameter.  There is some tortuosity of the left iliac system.  There is no iliac aneurysm.  REVIEW OF SYSTEMS:  Today the patient denies any abdominal pain.  He has chronic musculoskeletal pain.  He has had no changes and no new back pain.  PHYSICAL EXAM:  Vital signs:  Blood pressure is 112/74 in the left arm, heart rate 76 and regular, oxygen saturation 98% on room air.  Chest: Clear to auscultation.  Cardiac:  Regular rate and rhythm without murmur.  He has 2+ femoral pulses bilaterally.  Abdomen:  Soft, nontender, nondistended.  He had a carotid duplex exam today which shows no significant carotid stenosis.  In review of his films I believe he would be a candidate for placement of endovascular stent graft.  I will speak with the [employer] about placing an excluder device in him.  We tentatively have him scheduled for 06/14/2010 pending approval from the [employer].  Risks, benefits, possible complications and procedure details were explained to the patient and his wife today.  They understand the necessity for imaging followup postoperatively as well as small risk of infection, bleeding and other possible complications.  They understand and agree to proceed.    Janetta Hora. Fields, MD Electronically Signed  CEF/MEDQ  D:  06/09/2010  T:  06/09/2010  Job:  [id number]  cc:   Fleet Contras, M.D. Kathrin Penner. Vear Clock, M.D. Noralyn Pick. Eden Emms, MD, [facility] Wynn Banker, MD

## 2010-06-15 ENCOUNTER — Other Ambulatory Visit: Payer: Self-pay | Admitting: Vascular Surgery

## 2010-06-15 ENCOUNTER — Ambulatory Visit (HOSPITAL_COMMUNITY)
Admission: RE | Admit: 2010-06-15 | Discharge: 2010-06-15 | Disposition: A | Discharge status: Home / Self Care | Payer: Medicaid Other | Source: Ambulatory Visit | Attending: Vascular Surgery | Admitting: Vascular Surgery

## 2010-06-15 ENCOUNTER — Encounter (HOSPITAL_COMMUNITY)
Admission: RE | Admit: 2010-06-15 | Discharge: 2010-06-15 | Disposition: A | Discharge status: Home / Self Care | Payer: Medicaid Other | Source: Ambulatory Visit | Attending: Vascular Surgery | Admitting: Vascular Surgery

## 2010-06-15 DIAGNOSIS — Z01811 Encounter for preprocedural respiratory examination: Secondary | ICD-10-CM

## 2010-06-15 DIAGNOSIS — Z01812 Encounter for preprocedural laboratory examination: Secondary | ICD-10-CM | POA: Insufficient documentation

## 2010-06-15 DIAGNOSIS — Z01818 Encounter for other preprocedural examination: Secondary | ICD-10-CM | POA: Insufficient documentation

## 2010-06-15 LAB — COMPREHENSIVE METABOLIC PANEL
AST: 17 U/L (ref 0–37)
BUN: 9 mg/dL (ref 6–23)
CO2: 25 mEq/L (ref 19–32)
Calcium: 9.5 mg/dL (ref 8.4–10.5)
Creatinine, Ser: 0.96 mg/dL (ref 0.4–1.5)
GFR calc Af Amer: >60 mL/min (ref >60)
GFR calc non Af Amer: >60 mL/min (ref >60)
Total Bilirubin: 0.4 mg/dL (ref 0.3–1.2)

## 2010-06-15 LAB — URINALYSIS, ROUTINE W REFLEX MICROSCOPIC
Bilirubin Urine: NEGATIVE
Glucose, UA: NEGATIVE mg/dL
Ketones, ur: NEGATIVE mg/dL
Specific Gravity, Urine: 1.011 (ref 1.005–1.030)
pH: 5.5 (ref 5.0–8.0)

## 2010-06-15 LAB — CBC
Hemoglobin: 13.9 g/dL (ref 13.0–17.0)
MCH: 30.2 pg (ref 26.0–34.0)
MCV: 83 fL (ref 78.0–100.0)
RBC: 4.6 MIL/uL (ref 4.22–5.81)

## 2010-06-15 LAB — TYPE AND SCREEN: Antibody Screen: NEGATIVE

## 2010-06-15 LAB — BLOOD GAS, ARTERIAL
Acid-Base Excess: 1.5 mmol/L (ref 0.0–2.0)
Bicarbonate: 25.5 mEq/L — ABNORMAL HIGH (ref 20.0–24.0)
TCO2: 26.7 mmol/L (ref 0–100)
pCO2 arterial: 39.3 mmHg (ref 35.0–45.0)
pO2, Arterial: 74.8 mmHg — ABNORMAL LOW (ref 80.0–100.0)

## 2010-06-15 LAB — PROTIME-INR: Prothrombin Time: 13.8 seconds (ref 11.6–15.2)

## 2010-06-15 LAB — APTT: aPTT: 26 seconds (ref 24–37)

## 2010-06-15 LAB — SURGICAL PCR SCREEN: MRSA, PCR: NEGATIVE

## 2010-06-17 NOTE — Procedures (Unsigned)
CAROTID DUPLEX EXAM  INDICATION:  Preop.  HISTORY: Diabetes:  No. Cardiac:  No. Hypertension:  Yes. Smoking:  Yes. Previous Surgery:  No. CV History:  Patient is currently asymptomatic. Amaurosis Fugax No, Paresthesias No, Hemiparesis No.                                      RIGHT             LEFT Brachial systolic pressure:         128               126 Brachial Doppler waveforms:         WNL               WNL Vertebral direction of flow:        Antegrade         Antegrade DUPLEX VELOCITIES (cm/sec) CCA peak systolic                   86                90 ECA peak systolic                   77                79 ICA peak systolic                   75                65 ICA end diastolic                   33                25 PLAQUE MORPHOLOGY:                  Heterogenous PLAQUE AMOUNT:                      Minimal PLAQUE LOCATION:                    Bulb  IMPRESSION:  Bilaterally no hemodynamically significant stenosis. Bilateral bulbs are prominent in size, the right measuring 1.12 cm while the left measures 1.33 cm.  ___________________________________________ Janetta Hora. Fields, MD  OD/MEDQ  D:  06/08/2010  T:  06/08/2010  Job:  [id number]

## 2010-06-20 ENCOUNTER — Inpatient Hospital Stay (HOSPITAL_COMMUNITY): Payer: Medicaid Other

## 2010-06-20 ENCOUNTER — Inpatient Hospital Stay (HOSPITAL_COMMUNITY)
Admission: RE | Admit: 2010-06-20 | Discharge: 2010-06-22 | DRG: 238 | Disposition: A | Discharge status: Home / Self Care | Payer: Medicaid Other | Source: Ambulatory Visit | Attending: Vascular Surgery | Admitting: Vascular Surgery

## 2010-06-20 DIAGNOSIS — I714 Abdominal aortic aneurysm, without rupture, unspecified: Principal | ICD-10-CM | POA: Diagnosis present

## 2010-06-20 DIAGNOSIS — J4489 Other specified chronic obstructive pulmonary disease: Secondary | ICD-10-CM | POA: Diagnosis present

## 2010-06-20 DIAGNOSIS — Z01812 Encounter for preprocedural laboratory examination: Secondary | ICD-10-CM

## 2010-06-20 DIAGNOSIS — J449 Chronic obstructive pulmonary disease, unspecified: Secondary | ICD-10-CM | POA: Diagnosis present

## 2010-06-20 DIAGNOSIS — Z981 Arthrodesis status: Secondary | ICD-10-CM

## 2010-06-20 DIAGNOSIS — E785 Hyperlipidemia, unspecified: Secondary | ICD-10-CM | POA: Diagnosis present

## 2010-06-20 DIAGNOSIS — K219 Gastro-esophageal reflux disease without esophagitis: Secondary | ICD-10-CM | POA: Diagnosis present

## 2010-06-20 DIAGNOSIS — Z79899 Other long term (current) drug therapy: Secondary | ICD-10-CM

## 2010-06-20 DIAGNOSIS — F17200 Nicotine dependence, unspecified, uncomplicated: Secondary | ICD-10-CM | POA: Diagnosis present

## 2010-06-20 DIAGNOSIS — I10 Essential (primary) hypertension: Secondary | ICD-10-CM | POA: Diagnosis present

## 2010-06-20 DIAGNOSIS — Z7982 Long term (current) use of aspirin: Secondary | ICD-10-CM

## 2010-06-20 HISTORY — PX: OTHER SURGICAL HISTORY: SHX169

## 2010-06-20 LAB — CBC
HCT: 35.7 % — ABNORMAL LOW (ref 39.0–52.0)
MCHC: 35.6 g/dL (ref 30.0–36.0)
MCV: 82.6 fL (ref 78.0–100.0)
Platelets: 191 10*3/uL (ref 150–400)
RDW: 12.4 % (ref 11.5–15.5)
WBC: 8.6 10*3/uL (ref 4.0–10.5)

## 2010-06-20 LAB — BASIC METABOLIC PANEL
BUN: 11 mg/dL (ref 6–23)
Chloride: 106 mEq/L (ref 96–112)
Creatinine, Ser: 0.92 mg/dL (ref 0.4–1.5)
Glucose, Bld: 94 mg/dL (ref 70–99)
Potassium: 4.1 mEq/L (ref 3.5–5.1)

## 2010-06-20 LAB — APTT: aPTT: 30 seconds (ref 24–37)

## 2010-06-20 LAB — MAGNESIUM: Magnesium: 1.8 mg/dL (ref 1.5–2.5)

## 2010-06-21 LAB — CBC
HCT: 34.3 % — ABNORMAL LOW (ref 39.0–52.0)
Hemoglobin: 12.1 g/dL — ABNORMAL LOW (ref 13.0–17.0)
MCH: 29.6 pg (ref 26.0–34.0)
MCHC: 35.3 g/dL (ref 30.0–36.0)
MCV: 83.9 fL (ref 78.0–100.0)
RDW: 12.6 % (ref 11.5–15.5)

## 2010-06-21 LAB — BASIC METABOLIC PANEL
BUN: 9 mg/dL (ref 6–23)
CO2: 28 mEq/L (ref 19–32)
Calcium: 8.1 mg/dL — ABNORMAL LOW (ref 8.4–10.5)
Creatinine, Ser: 0.99 mg/dL (ref 0.4–1.5)
GFR calc non Af Amer: >60 mL/min (ref >60)
Glucose, Bld: 118 mg/dL — ABNORMAL HIGH (ref 70–99)

## 2010-06-21 NOTE — Op Note (Signed)
NAMETIRRELL, BUCHBERGER                 ACCOUNT NO.:  [id number]  MEDICAL RECORD NO.:  [id number]           PATIENT TYPE:  I  LOCATION:  3302                         FACILITY:  [facility]  PHYSICIAN:  Di Kindle. Edilia Bo, M.D.DATE OF BIRTH:  Feb 14, 1950  DATE OF PROCEDURE:  06/20/2010 DATE OF DISCHARGE:                              OPERATIVE REPORT   PREOPERATIVE DIAGNOSIS:  A 5.4-cm infrarenal abdominal aortic aneurysm.  POSTOPERATIVE DIAGNOSIS:  A 5.4-cm infrarenal abdominal aortic aneurysm.  PROCEDURE:  Ultrasound-guided access bilateral common femoral arteries with percutaneous endovascular aneurysm repair with two components.  SURGEON:  Di Kindle. Edilia Bo, MD  CO-SURGEONArlys John L. Imogene Burn, MD  ANESTHESIA:  General.  INDICATIONS:  This is a 60 year old gentleman who was found to have an infrarenal abdominal aortic aneurysm.  This had enlarged to 5.4 cm.  An elective repair was recommended by Dr. Darrick Penna.  Dr. Darrick Penna had been ill and the patient already has lines placed and he was agreeable to having me proceed with surgery.  TECHNIQUE:  Ultrasound-guided access to the left common femoral artery and introduction of the left limb of the graft are dictated separately by Dr. Imogene Burn.  The patient had been taken to the operating room, received a general anesthetic, and the abdomen and both groins were prepped and draped in the usual sterile fashion.  Under ultrasound guidance, the right common femoral artery was cannulated and guidewire introduced into the infrarenal aorta under fluoroscopic control.  The track was dilated with an 8-French dilator and then the first Perclose device was introduced over the wire and then advanced after the wire was removed and rotated 30 degrees medially.  The foot was then deployed, the catheter retracted, and the suture deployed and then retrieved.  This was then clamped off medially.  The catheter was then retracted, guidewire reintroduced and  then exchanged for a new Perclose device which was advanced over the wire and then rotated 30 degrees lateral.  Again, the sutures were deployed and retained and then retracted.  An 8-French sheath was then introduced on the right side.  On the right side, the Bentson wire was exchanged for an Amplatz wire using the Omni flush catheter.  The position distally was confirmed to be sure that this does not advance too far.  The 18-French sheath was then introduced over the wire and positioned to the proximal L2 vertebral body.  The patient was then heparinized with 7000 units of IV heparin.  On the left side, Dr. Imogene Burn placed the pigtail catheter, and after careful positioning and under magnification the aortogram was obtained to mark the level of the renal arteries.  The 23- x 14- x 16-cm Gore Excluder device was then advanced through the right femoral sheath to just below the renal artery and then the sheath was retracted.  Next, the proximal aspect of the device was deployed extending down below the contralateral gate.  It appeared that the graft was slightly low and for this reason we then used a C3 device and I was able to retract the proximal part of the graft and allow it to be advanced  approximately 2 mm and then it was redeployed and was in excellent position.  Next, as dictated by Dr. Imogene Burn the contralateral gate was cannulated and then this position was confirmed with the catheter within the graft and then the Amplatz wire was placed through the contralateral gate and the left limb of the graft which was at 12-mm x 12-cm limb was advanced overlapping 3 cm into the contralateral gate and then deployed.  Prior to doing this, we did shoot a retrograde iliac arteriogram at a RAO projection to allow identification of level of hypogastric artery and the graft was clearly above the level of the hypogastric artery.  On the left side prior to deploying the ipsilateral limb completely,  a retrograde iliac arteriogram was obtained in a LAO projection again to demonstrate the level of the hypogastric artery and then the left ipsilateral limb was completely deployed and was in excellent position. We then ballooned the proximal graft by aortic bifurcation and distal graft.  Completion arteriogram was obtained.  There was a slight blush on delayed film suggesting possibly a type 2 endoleak from a small branch.  There appeared to be a good seal proximally and distally. First, the attention was turned to the left sheath.  The sheath was removed and pressure held for hemostasis over the wire.  The medial Perclose device was secured and then the lateral Perclose device secured.  There appeared to be reasonable hemostasis.  The wire was then removed, and again the sutures were tightened until there was good hemostasis and then cut and pressure held for hemostasis.  On the right side, likewise the catheter was removed and pressure held for hemostasis.  The medial suture was tied and secured and then the lateral suture tied and secured.  There appeared to be reasonable hemostasis and the wire was then removed and then again the sutures were tied and secured again.  There appeared to be good hemostasis.  The sutures were cut.  Pressure was held for hemostasis.  No immediate complications were noted.  There were palpable pedal pulses postoperatively.  All needle and sponge counts were correct.     Di Kindle. Edilia Bo, M.D.     CSD/MEDQ  D:  06/20/2010  T:  06/21/2010  Job:  [id number]  cc:   Janetta Hora. Darrick Penna, MD Fleet Contras, MD Kathrin Penner. Vear Clock, M.D. Noralyn Pick. Eden Emms, MD, [facility]  Electronically Signed by Waverly Ferrari M.D. on 06/21/2010 09:54:44 AM

## 2010-06-22 NOTE — Discharge Summary (Addendum)
  NAMETRE, SANKER                 ACCOUNT NO.:  [id number]  MEDICAL RECORD NO.:  [id number]  LOCATION:                                 FACILITY:  PHYSICIAN:  Di Kindle. Edilia Bo, M.D.DATE OF BIRTH:  1950/05/02  DATE OF ADMISSION:  06/20/2010 DATE OF DISCHARGE:  06/21/2010                              DISCHARGE SUMMARY   CHIEF COMPLAINT:  Abdominal aortic aneurysm.  Emma is a 60 year old gentleman with a known infrarenal abdominal aortic aneurysm of now 5 cm.  He had a CT angio which showed a 5.3-cm abdominal aortic aneurysm with 3 cm neck in bilateral single renal arteries.  The neck was 19-mm in diameter with some tortuosity of the left iliac system, but there is no iliac aneurysm.  He was admitted for endovascular pair of abdominal aortic aneurysm.  He denied any symptoms of abdominal pain.  PAST MEDICAL HISTORY: 1. Hypertension, chronic back pain due to lumbar disk degeneration. 2. Hyperlipidemia. 3. Spinal fusion of the neck in the lower back.  He also smokes quarter pack of cigarettes per day.  HOSPITAL COURSE:  The patient was taken to the operating room by Dr. Edilia Bo for percutaneous endovascular repair, abdominal aortic aneurysm with a Gore excluder graft at the right limb of 23 x 14 x 16.  The left was 12 x 12.  Postoperatively, the patient did well.  Vital signs were stable.  He had a low-grade temperature of 99-100 without cough or symptoms.  He had no abdominal pain.  Abdomen was soft and nontender. He had normoactive bowel sounds.  Groin sites were soft.  He had palpable pedal pulses.  His hemoglobin and hematocrit were 12 and 34 and his white count 11.3.  Low-grade temp is probably mostly atelectasis, is using his incentive spirometer.  He will go home today to tolerate his diet and ambulate.  DISCHARGE MEDICATIONS: 1. Albuterol inhaler 1 puff every 4 hours as needed. 2. Aspirin 81 mg daily. 3. Butrans patch 20 mg weekly on Sundays. 4. Cozaar 50 mg  daily. 5. Diazepam 1 mg 3 times a day as needed. 6. Neurontin 400 mg 4 times a day, 1 tablet at bedtime. 7. Nexium 40 mg daily. 8. Oxycodone 1 tablet 3 times a day as needed for pain.  He was given     a prescription for 20 tablets.  FINAL DIAGNOSIS:  Infrarenal abdominal aortic aneurysm status post percutaneous endovascular repair.  His other medical conditions were stable and controlled with his medications while in-house.  DISPOSITION:  He was discharged to home and then follow up with Dr. Edilia Bo in 3-4 weeks with a CTA of the abdomen and pelvis to check the graft.     Della Goo, PA-C   ______________________________ Di Kindle. Edilia Bo, M.D.    RR/MEDQ  D:  06/21/2010  T:  06/21/2010  Job:  [id number]  Electronically Signed by Della Goo PA on 07/03/2010 03:45:11 PM Electronically Signed by Waverly Ferrari M.D. on 07/04/2010 06:58:05 PM

## 2010-06-25 NOTE — Op Note (Signed)
[name], [name]                 ACCOUNT NO.:  [id number]  MEDICAL RECORD NO.:  [id number]           PATIENT TYPE:  I  LOCATION:  3302                         FACILITY:  [facility]  PHYSICIAN:  [name], MD       DATE OF BIRTH:  1950/05/27  DATE OF PROCEDURE:  06/20/2010 DATE OF DISCHARGE:                              OPERATIVE REPORT   PROCEDURES:  Endovascular aortic repair, bilateral common femral artery cannulation under ultrasound guidance, repair of both arteriotomies with ProGlide x 3, aortogram, and placement of iliac extension on the left.  PREOPERATIVE DIAGNOSIS:  Abdominal aortic aneurysm.  POSTOPERATIVE DIAGNOSIS:  Abdominal aortic aneurysm.  SURGEON:  Di Kindle. Edilia Bo, MD  CO-SURGEON:  [name], MD  ANESTHESIA:  General.  FINDINGS: small type 2 endoleak, otherwise exclusion of the aortic aneurysm.  SPECIMENS:  None.  ESTIMATED BLOOD LOSS:  Fluids and urine output can be found in Dr. Adele Dan dictation.  INDICATIONS:  Indications can be found in Dr. Adele Dan dictation.  DESCRIPTION OF PROCEDURE:  The bulk of this procedure will be dictated by Dr. Edilia Bo.  I will be dictating my portion of this case.  After obtaining full informed written consent from the patient, he was brought back to the operating room, and placed supine upon the operating table.  Prior to induction, he had received IV antibiotics.  After obtaining adequate anesthesia, he was then prepped and draped in standard fashion for an endovascular aortic aneurysm repair.  First, Dr. Edilia Bo obtained access in the right groin and placed two ProGlides in a Preclose technique. The 8-French sheath was placed on this side and then the Bentson wire was exchanged for an Amplatz wire placed into the descending thoracic aorta.  Then, we turned our attention to the left groin.  Under ultrasound guidance, I cannulated the left common femoral artery under ultrasound guidance, and passed the  Bentson wire up into the aorta.  The arteriotomy was dilated with an 8-French dilator.  At this point, two ProGlides were placed in the Preclose technique with one rotated about 30 degrees medial and then other rotated 30 degrees laterally.  The sutures were then stayed and then a long 8-French sheath was placed over the wire on this side, then the dilator was remove.  Using a Kumpe catheter over the wire,  I was able to navigate up into the suprarenal aorta and advanced the catheter to this level.  The wire was exchanged out for an Amplatz wire.  The catheter was then exchanged out for a pigtail catheter, which was loaded to the suprarenal aortic position.  I then pulled out the wire and then allowing the pigtail to reform.  The pigtail catheter was connected to the power injector circuit after a declotting and deairing maneuver.  At this point, Dr. Edilia Bo  exchanged out on the right side, the sheath for the 18-French sheath which was placed under a fluoroscopic guidance up to the level of the renal arteries and then the main body was placed on this side, details could be found at Dr. Adele Dan note.  At this point, a  power injector aortogram was completed, this demonstrated the left renal artery was the most inferior and based on this position, the top part of the main body was deployed, details could be found in Dr. Adele Dan note.  At this point, it was felt that the device was a little low, so it was repositioned after reconstraining the top portion of this endograft and advanced slightly, so it deployed exactly as desired at the level just below the left renal artery, so there was absolutely no more neck in this patient's aorta.  At this point, then we disconnected the power injector circuit and I placed a Glidewire through the pigtail catheter, pulled down the wire and the pigtail down below the level of the graft and then exchanged out the catheter at this point for a Kumpe  catheter.  Using this combination of Glidewire and Kumpe catheter, I was able to cannulate the contralateral gate and sent the wire up through the endograft and advanced the catheter also to the level of the suprarenal aorta.  The catheter was exchanged out for a pigtail catheter.  This was placed in the endograft and rotated to demonstrate position within the graft.  At this point, the wire was exchanged out for the Amplatz wire, and advanced into the descending thoracic aorta.  I pulled down the pigtail to the flow divider and then I at this point did an injection after pulling back the left sheath to the appropriate location.  This retrograde injection was completed and demonstrated the level of the left internal iliac artery.  Based on the measurements using the pigtail catheter, we felt a 12 mm x 12 cm iliac limb was going to be needed, so at this point, the left sheath was exchanged out for 12-French sheath, which was placed under fluoroscopic guidance up to the level flow divider.  The dilator was removed.  The left iliac limb was then placed under fluoroscopic guidance up to the level of the flow divider.  I then pulled back the sheath to the appropriate level and then confirmed the position of the left iliac extension limb under fluoroscopic guidance and deployed it, so it mated with the flow divider with appropriate overlap.  At this point, I pulled out the stent delivery device without any problems.  At this point, Dr. Edilia Bo did a retrograde ejection on the right side to demonstrate that the right iliac limb would avoid covering the right internal iliac artery. So, at this point, the rest of the main body was fully deployed as was the right iliac limb.  The endograft was released from the delivery device and then the whole delivery device was removed at this point without any difficulties.  At this point, we then ballooned the graft and its junction point and at the top of the  graft while on the first injection, there appeared to be a relatively rapidly filling Endoleak. Thus, the top portion of this graft was re-angioplastied with a Q50 balloon.  On repeat injection, the endoleak that was felt to be a type 2 with no evidence of a type 1 or type 3 endoleak.  So, it was felt that with reversal of heparinization, this would be self-limiting and would resolve.  So, at this point, we turned our attention to repairing each arteriotomy on the left side.  We pulled out the 12-French sheath and then cinched down each set of the ProGlide sutures sequentially twice and then pulled out the wire and then this allowed [place]  to sequentially further tighten up the sutures.  Eventually by the third sets of cinching, there was no more active bleeding from the left artery.  Then all four sutures were placed under tension using a hemostat.  Then, at this point, Dr. Edilia Bo repeated this process on the right side in a similar fashion.  There was no more active bleeding and was placed under tension and then I looked at the left groin, there was no bleeding from the deep tissue.  There was a small bleed on the skin edge, which we controlled  with pressure over a few minutes.  It resolved completely, and I transected all four sutures with the ProGlide knot cutter.  At this point, I then placed a U-stitch of 4-0 Vicryl in this left groin.  There was no more active bleeding.  In the similar fashion, the right groin incision was repaired with U-stitch.  Each groin was no longer bleeding and Dermabond was applied to each incision to further reinforce the skin incision.  The patient was allowed to awaken without any difficulties.  Both feet were pink and had palpable posterior tibial pulses.  Complications none.  Condition was stable.     [name], MD     BLC/MEDQ  D:  06/20/2010  T:  06/21/2010  Job:  [id number]  Electronically Signed by Leonides Sake MD on 06/25/2010 01:38:44 PM

## 2010-06-26 ENCOUNTER — Encounter: Payer: Self-pay | Admitting: Vascular Surgery

## 2010-06-26 ENCOUNTER — Other Ambulatory Visit: Payer: Self-pay | Admitting: Vascular Surgery

## 2010-06-26 DIAGNOSIS — I714 Abdominal aortic aneurysm, without rupture: Secondary | ICD-10-CM

## 2010-07-19 NOTE — Progress Notes (Signed)
[facility] [place]  Postop EVAR  History of Present Illness  [name] is a 60 y.o. male who presents for routine follow up s/p perc. EVAR (Date: 06/20/10).  The patient has had no back or abdominal pain.  He had his repeat CTA yesterday.  Physical Examination  Filed Vitals:   07/21/10 1115  BP: 133/87  Pulse: 71  Temp: 98.3 F (36.8 C)    Vascular: Vessel Right Left  Radial Palpable Palpable  [place] Palpable Palpable  Brachial Palpable Palpable  Carotid Palpable, without bruit Palpable, without bruit  Aorta Non-palpable N/A  Femoral Palpable Palpable  Popliteal Non-palpable Non-palpable  PT Palpable Palpable  DP Palpable Palpable   Gastrointestinal: soft, NTND, -G/R, - HSM, - masses, - CVAT B, no midline pulsatile mass, Bilateral groin punctures without hematoma  Non-Invasive Vascular Imaging  CTA ABD/PELVIS (Date: 07/20/10)  AAA sac size: 5.2 cm x 5.4 cm  No endoleak detected   Medical Decision Making  [name] is a 60 y.o. male who presents s/p EVAR.  Pt is asymptomatic with unchanged sac size, not surprising he's only one month postop.  I discussed with the patient the importance of surveillance of the endograft.  The next endograft duplex will be scheduled for 3 months.  The patient will follow up with [place] in 3 months with this study.  The patient has given up cigarette smoking and is using an electronic cigarette now.  Thank you for allowing [place] to participate in this patient's care.  Leonides Sake, MD [facility] [place] Office: [phone] Pager: [phone]

## 2010-07-20 ENCOUNTER — Ambulatory Visit
Admission: RE | Admit: 2010-07-20 | Discharge: 2010-07-20 | Disposition: A | Discharge status: Home / Self Care | Payer: Medicaid Other | Source: Ambulatory Visit | Attending: Vascular Surgery | Admitting: Vascular Surgery

## 2010-07-20 ENCOUNTER — Ambulatory Visit: Payer: Medicaid Other | Admitting: Vascular Surgery

## 2010-07-20 DIAGNOSIS — I714 Abdominal aortic aneurysm, without rupture: Secondary | ICD-10-CM

## 2010-07-20 MED ORDER — IOHEXOL 300 MG/ML  SOLN: 100.0000 mL | Freq: Once | INTRAMUSCULAR | Status: AC | PRN

## 2010-07-20 MED ORDER — IOHEXOL 300 MG/ML  SOLN
100.0000 mL | Freq: Once | INTRAMUSCULAR | Status: AC | PRN
  Administered 2010-07-20: 100 mL via INTRAVENOUS

## 2010-07-21 ENCOUNTER — Ambulatory Visit (INDEPENDENT_AMBULATORY_CARE_PROVIDER_SITE_OTHER): Payer: Medicaid Other | Admitting: Vascular Surgery

## 2010-07-21 ENCOUNTER — Encounter: Payer: Self-pay | Admitting: Vascular Surgery

## 2010-07-21 VITALS — BP 133/87 | HR 71 | Temp 98.3°F

## 2010-07-21 DIAGNOSIS — I714 Abdominal aortic aneurysm, without rupture: Secondary | ICD-10-CM

## 2010-07-21 NOTE — Progress Notes (Signed)
Postop EVAR on 06-20-10, doing well

## 2010-08-02 ENCOUNTER — Ambulatory Visit: Payer: Medicaid Other | Admitting: Vascular Surgery

## 2010-10-19 ENCOUNTER — Encounter: Payer: Self-pay | Admitting: Vascular Surgery

## 2010-10-20 ENCOUNTER — Ambulatory Visit (INDEPENDENT_AMBULATORY_CARE_PROVIDER_SITE_OTHER): Payer: Medicaid Other | Admitting: Vascular Surgery

## 2010-10-20 ENCOUNTER — Encounter: Payer: Self-pay | Admitting: Vascular Surgery

## 2010-10-20 VITALS — BP 153/97 | HR 60 | Resp 16 | Ht 68.0 in | Wt 170.6 lb

## 2010-10-20 DIAGNOSIS — Z48812 Encounter for surgical aftercare following surgery on the circulatory system: Secondary | ICD-10-CM

## 2010-10-20 DIAGNOSIS — I714 Abdominal aortic aneurysm, without rupture, unspecified: Secondary | ICD-10-CM | POA: Insufficient documentation

## 2010-10-20 NOTE — Progress Notes (Signed)
[facility]   Established EVAR  History of Present Illness  RAMONDO DIETZE is a 60 y.o. male who presents for routine follow up s/p EVAR (Date: 06/20/10).  Most recent EVAR duplex (Date: 10/20/10) demonstrates: no endoleak and decreased sac size. There is no recent CT.  The patient has had back or abdominal pain.  Past Medical History, Past Surgical History, Social History, Family History, Medications, Allergies, and Review of Systems are unchanged from previous evaluation on 17 MAY 12.  Physical Examination  Filed Vitals:   10/20/10 0934  BP: 153/97  Pulse: 60  Resp: 16  Height: 5\' 8"  (1.727 m)  Weight: 170 lb 9.6 oz (77.384 kg)    General: A&O x 3, WDWN  Pulmonary: Sym exp, good air movt, CTAB, no rales, rhonchi, & wheezing  Cardiac: RRR, Nl S1, S2, no Murmurs, rubs or gallops  Vascular: Vessel Right Left  Radial Palpable Palpable  Brachial Palpable Palpable  Carotid Palpable, without bruit Palpable, without bruit  Aorta Non-palpable N/A  Femoral Palpable Palpable  Popliteal Non-palpable Non-palpable  PT Palpable Palpable  DP Palpable Palpable   Gastrointestinal: soft, NTND, -G/R, - HSM, - masses, - CVAT B, no pulsatile mass  Musculoskeletal: M/S 5/5 throughout , Extremities without ischemic changes   Neurologic: Pain and light touch intact in extremities , Motor exam as listed above  Non-Invasive Vascular Imaging  EVAR Duplex (Date: 10/20/10)  AAA sac size: 3.8 cm x 4.0 cam (decreased)  No endoleak detected  Medical Decision Making  DANNIE HATTABAUGH is a 60 y.o. male who presents s/p EVAR.  Pt is asymptomatic with decreasing sac size.  I discussed with the patient the importance of surveillance of the endograft.  The next endograft duplex will be scheduled for 6 months.  The next CT will be scheduled for 12 months. (6 months after endograft duplex)  The patient will follow up with [place] in 6 months with these studies.  Thank you  for allowing [place] to participate in this patient's care.  Leonides Sake, MD [facility] [place] Office: [phone] Pager: [phone]

## 2010-10-20 NOTE — Progress Notes (Signed)
EVAR AAA scan performed 10/20/2010

## 2010-10-24 NOTE — Procedures (Unsigned)
VASCULAR LAB EXAM  INDICATION:  Follow up AAA endograft placed 06/20/2010.  HISTORY: Diabetes:  No. Cardiac:  No. Hypertension:  Yes. Smoking:  Currently.  EXAM:  AAA sac size:  3.8 cm  AP,  4.0 cm TRV. Previous sac on 07/20/2010:  5.2 cm AP,  5.4 cm TRV.  IMPRESSION: 1. The aorta and endograft appear patent. 2. No significant change in the size of the aneurysmal sac surrounding     the endograft. 3. No evidence of endoleak was detected.  ___________________________________________ William Hertz, MD  SH/MEDQ  D:  10/20/2010  T:  10/20/2010  Job:  [id number]

## 2010-10-31 LAB — I-STAT 8, (EC8 V) (CONVERTED LAB)
Glucose, Bld: 132 — ABNORMAL HIGH
Potassium: 3.5
TCO2: 29
pCO2, Ven: 47
pH, Ven: 7.37 — ABNORMAL HIGH

## 2010-10-31 LAB — POCT CARDIAC MARKERS
CKMB, poc: <1 — ABNORMAL LOW
CKMB, poc: <1 — ABNORMAL LOW
Myoglobin, poc: 97.5
Operator id: 198171
Operator id: 198171
Troponin i, poc: <0.05

## 2010-10-31 LAB — CBC
Hemoglobin: 14.9
MCHC: 34.6
Platelets: 284
RDW: 13

## 2010-10-31 LAB — DIFFERENTIAL
Basophils Absolute: 0.1
Basophils Relative: 1
Lymphocytes Relative: 42
Monocytes Absolute: 0.6
Neutro Abs: 3.6
Neutrophils Relative %: 45

## 2010-10-31 LAB — POCT I-STAT CREATININE: Operator id: 198171

## 2010-11-01 LAB — I-STAT 8, (EC8 V) (CONVERTED LAB)
Chloride: 105
HCT: 46
Hemoglobin: 15.6
Operator id: 284251
Potassium: 3.8
Sodium: 141
TCO2: 29
pH, Ven: 7.344 — ABNORMAL HIGH

## 2010-11-01 LAB — CBC
Platelets: 301
RBC: 4.74
WBC: 11.7 — ABNORMAL HIGH

## 2010-11-01 LAB — DIFFERENTIAL
Eosinophils Absolute: 0.6
Lymphocytes Relative: 40
Lymphs Abs: 4.6 — ABNORMAL HIGH
Monocytes Relative: 6
Neutro Abs: 5.7
Neutrophils Relative %: 49

## 2010-11-01 LAB — POCT I-STAT CREATININE
Creatinine, Ser: 1.2
Operator id: 284251

## 2010-11-01 LAB — AMYLASE: Amylase: 123

## 2011-01-31 ENCOUNTER — Encounter: Payer: Self-pay | Admitting: Gastroenterology

## 2011-04-24 ENCOUNTER — Ambulatory Visit
Admission: RE | Admit: 2011-04-24 | Discharge: 2011-04-24 | Disposition: A | Discharge status: Home / Self Care | Payer: Medicaid Other | Source: Ambulatory Visit | Attending: Anesthesiology | Admitting: Anesthesiology

## 2011-04-24 ENCOUNTER — Other Ambulatory Visit: Payer: Self-pay | Admitting: Anesthesiology

## 2011-04-24 DIAGNOSIS — R52 Pain, unspecified: Secondary | ICD-10-CM

## 2011-04-26 ENCOUNTER — Encounter: Payer: Self-pay | Admitting: Neurosurgery

## 2011-04-27 ENCOUNTER — Other Ambulatory Visit (INDEPENDENT_AMBULATORY_CARE_PROVIDER_SITE_OTHER): Payer: Medicaid Other | Admitting: *Deleted

## 2011-04-27 ENCOUNTER — Ambulatory Visit (INDEPENDENT_AMBULATORY_CARE_PROVIDER_SITE_OTHER): Payer: Medicaid Other | Admitting: Neurosurgery

## 2011-04-27 ENCOUNTER — Encounter: Payer: Self-pay | Admitting: Neurosurgery

## 2011-04-27 VITALS — BP 144/92 | HR 56 | Resp 16 | Ht 68.0 in | Wt 170.3 lb

## 2011-04-27 DIAGNOSIS — I714 Abdominal aortic aneurysm, without rupture: Secondary | ICD-10-CM

## 2011-04-27 DIAGNOSIS — Z48812 Encounter for surgical aftercare following surgery on the circulatory system: Secondary | ICD-10-CM

## 2011-04-27 NOTE — Progress Notes (Signed)
Addended by: Sharee Pimple on: 04/27/2011 10:55 AM   Modules accepted: Orders

## 2011-04-27 NOTE — Progress Notes (Signed)
Subjective:     Patient ID: [name], male   DOB: Oct 30, 1950, 61 y.o.   MRN: [id number]  HPI: This is a 61 year old male patient of Dr. Nicky Pugh followed now almost 11 months status post EVAR, the patient reports no back or abdominal pain, he is here today for his six-month AAA duplex area   Review of Systems: 12 point review of systems is notable for a history of surgical metal implants according to the patient which causes chronic pain he also has low back pain otherwise notable for the above.     Objective:   Physical Exam: Patient is currently not smoking. He has 2+ radial pulses 2+ carotid pulses no popliteal pulses palpable lower extremity pulses DP and PT are 2+ bilaterally  Past Medical History  Diagnosis Date  . Back pain   . HTN (hypertension)   . Degenerative disk disease   . Hyperlipidemia   . Hypercholesterolemia   . AAA (abdominal aortic aneurysm)    Past Surgical History  Procedure Date  . Appendectomy   . Spinal fusion     of neck and lower back  . Evar 06/20/10    for AAA repair     Social History History   Social History  . Marital Status: Legally Separated    Spouse Name: N/A    Number of Children: N/A  . Years of Education: N/A   Occupational History  . Not on file.   Social History Main Topics  . Smoking status: Smoker, Current Status Unknown -- 0.2 packs/day for 25 years  . Smokeless tobacco: Not on file   Comment: Using electronic cigarettes to add in quiting   . Alcohol Use: No  . Drug Use: No  . Sexually Active: Not on file   Other Topics Concern  . Not on file   Social History Narrative  . No narrative on file    Family History Family History  Problem Relation Age of Onset  . Atrial fibrillation Mother   . Heart disease Mother   . Stroke Father   . Heart disease Sister   . Diabetes Sister   . Heart disease Sister     Allergies  Allergen Reactions  . Lotensin   . Protonix     Current Outpatient Prescriptions    Medication Sig Dispense Refill  . albuterol (PROVENTIL) (2.5 MG/3ML) 0.083% nebulizer solution Take 2.5 mg by nebulization every 6 (six) hours as needed.        Marland Kitchen aspirin 81 MG tablet Take 81 mg by mouth daily.        . diazepam (VALIUM) 5 MG tablet Take 5 mg by mouth every 6 (six) hours as needed.        Marland Kitchen esomeprazole (NEXIUM) 40 MG capsule Take 40 mg by mouth daily before breakfast.        . fish oil-omega-3 fatty acids 1000 MG capsule Take 2 g by mouth daily.        Marland Kitchen gabapentin (NEURONTIN) 400 MG tablet Take 400 mg by mouth 4 (four) times daily.       . Lidocaine (LIDODERM EX) Apply topically. Patch every 12 hrs       . oxycodone (OXY-IR) 5 MG capsule Take 5 mg by mouth every 6 (six) hours as needed. Take 2 tablets every 6 hrs as needed for pain      . valsartan (DIOVAN) 80 MG tablet Take 80 mg by mouth daily.  Filed Vitals:   04/27/11 0954  Height: 5\' 8"  (1.727 m)  Weight: 170 lb 4.8 oz (77.248 kg)    Body mass index is 25.89 kg/(m^2).    Assessment:    61y/o pt of Dr. Imogene Burn 11 months s/p AAA Endograft, no complaints, doing well.    Plan:     Per Dr. Imogene Burn the pt will f/u in 6 months after his next CT of abdomen/pelvis, questions were encouraged and answered.  Lauree Chandler ANP     Clinic MD: Imogene Burn

## 2011-05-01 NOTE — Procedures (Unsigned)
VASCULAR LAB EXAM  INDICATION:  Followup abdominal aortic aneurysm endograft placed 06/20/2010.  HISTORY: Diabetes:  no Cardiac:  no Hypertension:  yes  EXAM:  Previous AAA sac size 10/20/2010:  3.8 cm AP, 4.0 cm transverse.  Today, 04/27/2011, 3.85 cm AP, 4 cm transverse.  IMPRESSION:  No change since previous exam of 10/20/2010.  ___________________________________________ [name], MD  SS/MEDQ  D:  04/27/2011  T:  04/27/2011  Job:  [id number]

## 2011-08-10 ENCOUNTER — Encounter (HOSPITAL_COMMUNITY): Payer: Self-pay | Admitting: Emergency Medicine

## 2011-08-10 ENCOUNTER — Emergency Department (HOSPITAL_COMMUNITY)
Admission: EM | Admit: 2011-08-10 | Discharge: 2011-08-10 | Disposition: A | Discharge status: Home / Self Care | Payer: Medicaid Other | Source: Home / Self Care | Attending: Emergency Medicine | Admitting: Emergency Medicine

## 2011-08-10 DIAGNOSIS — L0292 Furuncle, unspecified: Secondary | ICD-10-CM

## 2011-08-10 DIAGNOSIS — L247 Irritant contact dermatitis due to plants, except food: Secondary | ICD-10-CM

## 2011-08-10 DIAGNOSIS — L255 Unspecified contact dermatitis due to plants, except food: Secondary | ICD-10-CM

## 2011-08-10 MED ORDER — DOXYCYCLINE HYCLATE 100 MG PO CAPS: 100.0000 mg | ORAL_CAPSULE | Freq: Two times a day (BID) | ORAL | Status: DC

## 2011-08-10 MED ORDER — CLOBETASOL PROPIONATE 0.05 % EX CREA: TOPICAL_CREAM | Freq: Two times a day (BID) | CUTANEOUS | Status: DC

## 2011-08-10 NOTE — ED Notes (Signed)
Pt stated that the rash occurred over a month ago.

## 2011-08-10 NOTE — ED Provider Notes (Signed)
History     CSN: [id number]  Arrival date & time 08/10/11  1703   First MD Initiated Contact with Patient 08/10/11 1710      Chief Complaint  Patient presents with  . Poison Oak    (Consider location/radiation/quality/duration/timing/severity/associated sxs/prior treatment) HPI  Past Medical History  Diagnosis Date  . Back pain   . HTN (hypertension)   . Degenerative disk disease   . Hyperlipidemia   . Hypercholesterolemia   . AAA (abdominal aortic aneurysm)     Past Surgical History  Procedure Date  . Appendectomy   . Spinal fusion     of neck and lower back  . Evar 06/20/10    for AAA repair   . Back surgery     Family History  Problem Relation Age of Onset  . Atrial fibrillation Mother   . Heart disease Mother   . Stroke Father   . Heart disease Sister   . Diabetes Sister   . Heart disease Sister     History  Substance Use Topics  . Smoking status: Smoker, Current Status Unknown -- 1.0 packs/day for 25 years    Types: Cigarettes  . Smokeless tobacco: Not on file   Comment: Using electronic cigarettes to add in quiting   . Alcohol Use: No      Review of Systems  Allergies  Benazepril hcl and Pantoprazole sodium  Home Medications   Current Outpatient Rx  Name Route Sig Dispense Refill  . ALBUTEROL SULFATE (2.5 MG/3ML) 0.083% IN NEBU Nebulization Take 2.5 mg by nebulization every 6 (six) hours as needed.      . ASPIRIN 81 MG PO TABS Oral Take 81 mg by mouth daily.      Marland Kitchen CLOBETASOL PROPIONATE 0.05 % EX CREA Topical Apply topically 2 (two) times daily. Apply twice a day for 2 weeks 30 g 0  . DIAZEPAM 5 MG PO TABS Oral Take 5 mg by mouth every 6 (six) hours as needed.      Marland Kitchen ESOMEPRAZOLE MAGNESIUM 40 MG PO CPDR Oral Take 40 mg by mouth daily before breakfast.      . OMEGA-3 FATTY ACIDS 1000 MG PO CAPS Oral Take 2 g by mouth daily.      Marland Kitchen GABAPENTIN 400 MG PO TABS Oral Take 400 mg by mouth 4 (four) times daily.     Marland Kitchen LIDODERM EX Apply externally  Apply topically. Patch every 12 hrs     . OXYCODONE HCL 5 MG PO CAPS Oral Take 5 mg by mouth every 6 (six) hours as needed. Take 2 tablets every 6 hrs as needed for pain    . VALSARTAN 80 MG PO TABS Oral Take 80 mg by mouth daily.        BP 146/89  Pulse 62  Temp 98.3 F (36.8 C) (Oral)  Resp 12  SpO2 98%  Physical Exam  ED Course  Procedures (including critical care time)  Labs Reviewed - No data to display No results found.   1. Contact dermatitis and eczema due to plant       MDM  Patient presents with a brief amount of scaly pruritic rash on both of his forearms and hands. Have advised patient to use both a clobetasol cream and a oral antibiotics cycle for 10 days. Patient has a contact dermatitis with secondary cellulitis and multiple furuncular lesions        Jimmie Molly, MD 08/10/11 1825

## 2011-08-10 NOTE — ED Notes (Signed)
Pt stated that he thinks he has poison oak on bilateral arms that started on right upper chest. Pt stated that his left elbow is swollen. C/o itching, left elbow pain and yellow discharge from multiple open areas on bilateral arms from scratching and a odor from one spot on arm.

## 2011-08-16 ENCOUNTER — Emergency Department (HOSPITAL_COMMUNITY)
Admission: EM | Admit: 2011-08-16 | Discharge: 2011-08-16 | Disposition: A | Discharge status: Home / Self Care | Payer: Medicaid Other | Attending: Emergency Medicine | Admitting: Emergency Medicine

## 2011-08-16 ENCOUNTER — Encounter (HOSPITAL_COMMUNITY): Payer: Self-pay | Admitting: *Deleted

## 2011-08-16 DIAGNOSIS — Z9089 Acquired absence of other organs: Secondary | ICD-10-CM | POA: Insufficient documentation

## 2011-08-16 DIAGNOSIS — M7020 Olecranon bursitis, unspecified elbow: Secondary | ICD-10-CM | POA: Insufficient documentation

## 2011-08-16 DIAGNOSIS — Z79899 Other long term (current) drug therapy: Secondary | ICD-10-CM | POA: Insufficient documentation

## 2011-08-16 DIAGNOSIS — F17200 Nicotine dependence, unspecified, uncomplicated: Secondary | ICD-10-CM | POA: Insufficient documentation

## 2011-08-16 DIAGNOSIS — I10 Essential (primary) hypertension: Secondary | ICD-10-CM | POA: Insufficient documentation

## 2011-08-16 DIAGNOSIS — E785 Hyperlipidemia, unspecified: Secondary | ICD-10-CM | POA: Insufficient documentation

## 2011-08-16 NOTE — ED Provider Notes (Signed)
History     CSN: [id number]  Arrival date & time 08/16/11  2012   None     Chief Complaint  Patient presents with  . Joint Swelling    (Consider location/radiation/quality/duration/timing/severity/associated sxs/prior treatment) HPI Comments: Patient noticed on Saturday, he had swelling of his left elbow.  Denies any trauma denies decrease in range of motion.  He does take quite a bit of medicine for chronic pain, so was not comfortable with this.  He does not notice any numbness or tingling in his hand or wrist  The history is provided by the patient.    Past Medical History  Diagnosis Date  . Back pain   . HTN (hypertension)   . Degenerative disk disease   . Hyperlipidemia   . Hypercholesterolemia   . AAA (abdominal aortic aneurysm)     Past Surgical History  Procedure Date  . Appendectomy   . Spinal fusion     of neck and lower back  . Evar 06/20/10    for AAA repair   . Back surgery     Family History  Problem Relation Age of Onset  . Atrial fibrillation Mother   . Heart disease Mother   . Stroke Father   . Heart disease Sister   . Diabetes Sister   . Heart disease Sister     History  Substance Use Topics  . Smoking status: Smoker, Current Status Unknown -- 1.0 packs/day for 25 years    Types: Cigarettes  . Smokeless tobacco: Not on file   Comment: Using electronic cigarettes to add in quiting   . Alcohol Use: No      Review of Systems  Constitutional: Negative for fever and chills.  Musculoskeletal: Positive for joint swelling.  Neurological: Negative for weakness and numbness.    Allergies  Benazepril hcl and Pantoprazole sodium  Home Medications   Current Outpatient Rx  Name Route Sig Dispense Refill  . ALBUTEROL SULFATE (2.5 MG/3ML) 0.083% IN NEBU Nebulization Take 2.5 mg by nebulization every 6 (six) hours as needed. Shortness of breath    . ASPIRIN 81 MG PO TABS Oral Take 81 mg by mouth daily.     Marland Kitchen BENAZEPRIL HCL 40 MG PO TABS  Oral Take 40 mg by mouth daily.    Marland Kitchen CLOBETASOL PROPIONATE 0.05 % EX CREA Topical Apply 1 application topically 2 (two) times daily. Apply twice a day for 2 weeks    . DIAZEPAM 5 MG PO TABS Oral Take 5 mg by mouth every 6 (six) hours as needed. For anxiety    . DOXYCYCLINE HYCLATE 100 MG PO CAPS Oral Take 100 mg by mouth 2 (two) times daily.    Marland Kitchen ESOMEPRAZOLE MAGNESIUM 40 MG PO CPDR Oral Take 40 mg by mouth daily before breakfast.     . OMEGA-3 FATTY ACIDS 1000 MG PO CAPS Oral Take 2 g by mouth daily.     Marland Kitchen GABAPENTIN 400 MG PO TABS Oral Take 400 mg by mouth 4 (four) times daily.     . OXYCODONE HCL 5 MG PO CAPS Oral Take 5-10 mg by mouth every 6 (six) hours as needed. Take 2 tablets every 6 hrs as needed for pain      BP 135/91  Pulse 74  Temp 98.6 F (37 C) (Oral)  Resp 16  SpO2 98%  Physical Exam  Constitutional: He appears well-developed and well-nourished.  HENT:  Head: Normocephalic.  Eyes: Pupils are equal, round, and reactive to light.  Neck:  Normal range of motion.  Cardiovascular: Normal rate.   Pulmonary/Chest: Effort normal.  Musculoskeletal: Normal range of motion. He exhibits no edema and no tenderness.       Arms:   ED Course  Procedures (including critical care time)  Labs Reviewed - No data to display No results found.   1. Olecranon bursitis       MDM  This patient has an atraumatci Olecranon Bursitis.  He started taking Neurontin, and oxycodone for pain, however, followup with his primary care physician for monitoring        Arman Filter, NP 08/16/11 2321  Arman Filter, NP 08/16/11 2321

## 2011-08-16 NOTE — ED Notes (Signed)
Pt temp low due to drinking a cool soda.

## 2011-08-16 NOTE — ED Notes (Signed)
Provider at bedside

## 2011-08-16 NOTE — ED Notes (Signed)
Patient here with c/o left elbow swelling since Saturday.  Elbow is swollen and intact.

## 2011-08-17 NOTE — ED Provider Notes (Signed)
Medical screening examination/treatment/procedure(s) were performed by non-physician practitioner and as supervising physician I was immediately available for consultation/collaboration.  Youlanda Tomassetti R. Sequan Auxier, MD 08/17/11 0723 

## 2011-10-10 ENCOUNTER — Ambulatory Visit
Admission: RE | Admit: 2011-10-10 | Discharge: 2011-10-10 | Disposition: A | Discharge status: Home / Self Care | Payer: Medicaid Other | Source: Ambulatory Visit | Attending: Physician Assistant | Admitting: Physician Assistant

## 2011-10-10 ENCOUNTER — Other Ambulatory Visit: Payer: Self-pay | Admitting: Physician Assistant

## 2011-10-10 DIAGNOSIS — R1030 Lower abdominal pain, unspecified: Secondary | ICD-10-CM

## 2011-10-25 ENCOUNTER — Encounter: Payer: Self-pay | Admitting: Vascular Surgery

## 2011-10-26 ENCOUNTER — Ambulatory Visit (INDEPENDENT_AMBULATORY_CARE_PROVIDER_SITE_OTHER): Payer: Medicaid Other | Admitting: Vascular Surgery

## 2011-10-26 ENCOUNTER — Encounter: Payer: Self-pay | Admitting: Vascular Surgery

## 2011-10-26 ENCOUNTER — Ambulatory Visit
Admission: RE | Admit: 2011-10-26 | Discharge: 2011-10-26 | Disposition: A | Discharge status: Home / Self Care | Payer: Medicaid Other | Source: Ambulatory Visit | Attending: Vascular Surgery | Admitting: Vascular Surgery

## 2011-10-26 VITALS — BP 136/85 | HR 70 | Resp 16 | Ht 70.0 in | Wt 171.4 lb

## 2011-10-26 DIAGNOSIS — I714 Abdominal aortic aneurysm, without rupture: Secondary | ICD-10-CM

## 2011-10-26 DIAGNOSIS — Z48812 Encounter for surgical aftercare following surgery on the circulatory system: Secondary | ICD-10-CM

## 2011-10-26 MED ORDER — IOHEXOL 350 MG/ML SOLN
100.0000 mL | Freq: Once | INTRAVENOUS | Status: AC | PRN
  Administered 2011-10-26: 100 mL via INTRAVENOUS

## 2011-10-26 NOTE — Progress Notes (Signed)
[facility] [place]  Established EVAR  History of Present Illness  [name] is a 61 y.o. (1950-03-18) male who presents for routine follow up s/p EVAR (Date: 06/21/10).  Most recent EVAR duplex (Date: 04/27/11) demonstrates: no endoleak and stable sac size. Most recent CTA (Date: 10/26/11) demonstrates: no obvious endoleak and minimal sac size.  The patient has not had back or abdominal pain.  Past Medical History, Past Surgical History, Social History, Family History, Medications, Allergies, and Review of Systems are unchanged from previous evaluation on 04/27/11.  Physical Examination  Filed Vitals:   10/26/11 1046  BP: 136/85  Pulse: 70  Resp: 16  Height: 5\' 10"  (1.778 m)  Weight: 171 lb 6.4 oz (77.747 kg)  SpO2: 100%   Body mass index is 24.59 kg/(m^2).  General: A&O x 3, WDWN  Eyes: PERRLA, EOMI  Pulmonary: Sym exp, good air movt, CTAB, no rales, rhonchi, & wheezing  Cardiac: RRR, Nl S1, S2, no Murmurs, rubs or gallops  Vascular: palpable femorals without pseudoaneurysm  Gastrointestinal: soft, NTND, -G/R, - HSM, - masses, - CVAT B, no pulsatile midline mass  Musculoskeletal: M/S 5/5 throughout , Extremities without ischemic changes   Neurologic:  Pain and light touch intact in extremities , Motor exam as listed above  Non-Invasive Vascular Imaging  CTA Abd/Pelvis (Date: 10/26/11)  Based on my evaluation, this patient's EVAR is in stable infrarenal position without proximal or distal Type I endoleak, no obvious Type II endoleak and no evidence of Type III endoleak, the sac is fairly minimal with good shrinkage from preop  Medical Decision Making  [name] is a 61 y.o. male who presents s/p EVAR.  Pt is asymptomatic with decreased sac size.  I discussed with the patient the importance of surveillance of the endograft.  The next endograft duplex will be scheduled for 6 months.  The next CT will be scheduled for 12 months.  The  patient will follow up with [place] in 6 months with these studies.  If the studies are stable after the next CT, he can proceed with annual duplex surveillance.  Thank you for allowing [place] to participate in this patient's care.  Leonides Sake, MD [facility] [place] Office: [phone] Pager: [phone]  10/26/2011, 11:14 AM

## 2011-10-26 NOTE — Addendum Note (Signed)
Addended by: Sharee Pimple on: 10/26/2011 01:53 PM   Modules accepted: Orders

## 2012-01-23 HISTORY — PX: ABDOMINAL AORTIC ENDOVASCULAR STENT GRAFT: SHX5707

## 2012-03-12 ENCOUNTER — Other Ambulatory Visit: Payer: Self-pay | Admitting: Physician Assistant

## 2012-03-12 DIAGNOSIS — M545 Low back pain: Secondary | ICD-10-CM

## 2012-03-14 ENCOUNTER — Emergency Department (HOSPITAL_COMMUNITY)
Admission: EM | Admit: 2012-03-14 | Discharge: 2012-03-15 | Disposition: A | Discharge status: Home / Self Care | Payer: Medicaid Other | Attending: Emergency Medicine | Admitting: Emergency Medicine

## 2012-03-14 ENCOUNTER — Emergency Department (HOSPITAL_COMMUNITY): Payer: Medicaid Other

## 2012-03-14 ENCOUNTER — Encounter (HOSPITAL_COMMUNITY): Payer: Self-pay | Admitting: Emergency Medicine

## 2012-03-14 DIAGNOSIS — Z8739 Personal history of other diseases of the musculoskeletal system and connective tissue: Secondary | ICD-10-CM | POA: Insufficient documentation

## 2012-03-14 DIAGNOSIS — Z8639 Personal history of other endocrine, nutritional and metabolic disease: Secondary | ICD-10-CM | POA: Insufficient documentation

## 2012-03-14 DIAGNOSIS — Z862 Personal history of diseases of the blood and blood-forming organs and certain disorders involving the immune mechanism: Secondary | ICD-10-CM | POA: Insufficient documentation

## 2012-03-14 DIAGNOSIS — F17200 Nicotine dependence, unspecified, uncomplicated: Secondary | ICD-10-CM | POA: Insufficient documentation

## 2012-03-14 DIAGNOSIS — Z79899 Other long term (current) drug therapy: Secondary | ICD-10-CM | POA: Insufficient documentation

## 2012-03-14 DIAGNOSIS — Z8679 Personal history of other diseases of the circulatory system: Secondary | ICD-10-CM | POA: Insufficient documentation

## 2012-03-14 DIAGNOSIS — R079 Chest pain, unspecified: Secondary | ICD-10-CM | POA: Insufficient documentation

## 2012-03-14 DIAGNOSIS — I10 Essential (primary) hypertension: Secondary | ICD-10-CM | POA: Insufficient documentation

## 2012-03-14 LAB — CBC WITH DIFFERENTIAL/PLATELET
Basophils Absolute: 0 10*3/uL (ref 0.0–0.1)
Eosinophils Absolute: 0.6 10*3/uL (ref 0.0–0.7)
Eosinophils Relative: 6 % — ABNORMAL HIGH (ref 0–5)
Lymphocytes Relative: 36 % (ref 12–46)
MCV: 87 fL (ref 78.0–100.0)
Neutrophils Relative %: 50 % (ref 43–77)
Platelets: 273 10*3/uL (ref 150–400)
RDW: 12.7 % (ref 11.5–15.5)
WBC: 9.7 10*3/uL (ref 4.0–10.5)

## 2012-03-14 LAB — COMPREHENSIVE METABOLIC PANEL
ALT: 17 U/L (ref 0–53)
AST: 20 U/L (ref 0–37)
Calcium: 9 mg/dL (ref 8.4–10.5)
Potassium: 4.2 mEq/L (ref 3.5–5.1)
Sodium: 142 mEq/L (ref 135–145)
Total Protein: 7.8 g/dL (ref 6.0–8.3)

## 2012-03-14 NOTE — ED Notes (Signed)
Pt took one of his 20 mg oxycodone pills.  Spoke with Onalee Hua, NP who stated this was ok for pt to take.  I witnessed pt take the medication.

## 2012-03-14 NOTE — ED Notes (Signed)
Pt is in xray, will come back to B14.

## 2012-03-14 NOTE — ED Provider Notes (Signed)
History     CSN: [id number]  Arrival date & time 03/14/12  1950   First MD Initiated Contact with Patient 03/14/12 2025      Chief Complaint  Patient presents with  . Chest Pain    (Consider location/radiation/quality/duration/timing/severity/associated sxs/prior treatment) Patient is a 62 y.o. male presenting with chest pain. The history is provided by the patient. No language interpreter was used.  Chest Pain Pain location:  L chest Pain quality: shooting and throbbing   Pain radiates to:  Does not radiate Pain radiates to the back: no   Pain severity:  Mild Onset quality:  Sudden Duration:  2 hours Progression:  Waxing and waning Chronicity:  New Context: at rest   Relieved by:  Nothing Worsened by:  Nothing tried Associated symptoms: no abdominal pain, no nausea, no shortness of breath and no syncope     Past Medical History  Diagnosis Date  . Back pain   . HTN (hypertension)   . Degenerative disk disease   . Hyperlipidemia   . Hypercholesterolemia   . AAA (abdominal aortic aneurysm)     Past Surgical History  Procedure Laterality Date  . Appendectomy    . Spinal fusion      of neck and lower back  . Evar  06/20/10    for AAA repair   . Back surgery      Family History  Problem Relation Age of Onset  . Atrial fibrillation Mother   . Heart disease Mother   . Stroke Father   . Heart disease Sister   . Diabetes Sister   . Heart disease Sister     History  Substance Use Topics  . Smoking status: Smoker, Current Status Unknown -- 1.00 packs/day for 25 years    Types: Cigarettes  . Smokeless tobacco: Not on file     Comment: Using electronic cigarettes to add in quiting   . Alcohol Use: No      Review of Systems  Respiratory: Negative for shortness of breath.   Cardiovascular: Positive for chest pain. Negative for syncope.  Gastrointestinal: Negative for nausea and abdominal pain.  All other systems reviewed and are negative.    Allergies   Benazepril hcl and Pantoprazole sodium  Home Medications   Current Outpatient Rx  Name  Route  Sig  Dispense  Refill  . albuterol (PROVENTIL) (2.5 MG/3ML) 0.083% nebulizer solution   Nebulization   Take 2.5 mg by nebulization every 6 (six) hours as needed. Shortness of breath         . aspirin 81 MG tablet   Oral   Take 81 mg by mouth daily.          Alphonsus Sias Pollen 550 MG CAPS   Oral   Take 1 capsule by mouth daily.         . diazepam (VALIUM) 5 MG tablet   Oral   Take 5 mg by mouth every 6 (six) hours as needed. For anxiety         . esomeprazole (NEXIUM) 40 MG capsule   Oral   Take 40 mg by mouth daily before breakfast.          . fish oil-omega-3 fatty acids 1000 MG capsule   Oral   Take 1 g by mouth daily.          Marland Kitchen gabapentin (NEURONTIN) 400 MG tablet   Oral   Take 400 mg by mouth 2 (two) times daily.          Marland Kitchen  losartan (COZAAR) 100 MG tablet   Oral   Take 100 mg by mouth daily.         Marland Kitchen losartan-hydrochlorothiazide (HYZAAR) 100-12.5 MG per tablet   Oral   Take 0.5 tablets by mouth daily.         Marland Kitchen oxycodone (OXY-IR) 5 MG capsule   Oral   Take 20 mg by mouth every 6 (six) hours as needed for pain.            BP 115/78  Pulse 62  Temp(Src) 98.2 F (36.8 C) (Oral)  Resp 15  SpO2 95%  Physical Exam  Nursing note and vitals reviewed. Constitutional: He is oriented to person, place, and time. He appears well-developed and well-nourished.  HENT:  Head: Normocephalic and atraumatic.  Eyes: Pupils are equal, round, and reactive to light.  Neck: Normal range of motion.  Cardiovascular: Normal rate and regular rhythm.   Pulmonary/Chest: Effort normal and breath sounds normal.  Abdominal: Soft. Bowel sounds are normal.  Musculoskeletal: Normal range of motion.  Lymphadenopathy:    He has no cervical adenopathy.  Neurological: He is alert and oriented to person, place, and time.  Skin: Skin is warm and dry.  Psychiatric: He has a  normal mood and affect. His behavior is normal. Judgment and thought content normal.    ED Course  Procedures (including critical care time)  Labs Reviewed  CBC WITH DIFFERENTIAL - Abnormal; Notable for the following:    MCHC 36.3 (*)    Eosinophils Relative 6 (*)    All other components within normal limits  COMPREHENSIVE METABOLIC PANEL - Abnormal; Notable for the following:    Glucose, Bld 121 (*)    Total Bilirubin 0.2 (*)    GFR calc non Af Amer 64 (*)    GFR calc Af Amer 74 (*)    All other components within normal limits  POCT I-STAT TROPONIN I   Dg Chest 2 View  03/14/2012  *RADIOLOGY REPORT*  Clinical Data: Chest pain.  CHEST - 2 VIEW  Comparison: Chest x-ray 06/20/2010.  Findings: Lung volumes are normal.  No consolidative airspace disease.  No pleural effusions.  No pneumothorax.  No pulmonary nodule or mass noted.  Pulmonary vasculature and the cardiomediastinal silhouette are within normal limits. Atherosclerosis in the thoracic aorta.  IMPRESSION: 1. No radiographic evidence of acute cardiopulmonary disease. 2.  Atherosclerosis.   Original Report Authenticated By: Trudie Reed, M.D.      No diagnosis found.   Date: 03/14/2012  Rate:62  Rhythm: normal sinus rhythm  QRS Axis: left  Intervals: normal  ST/T Wave abnormalities: normal  Conduction Disutrbances:none  Narrative Interpretation: NSR  Old EKG Reviewed: unchanged  Labs and ECG reviewed, discussed with Dr. Ignacia Palma, results shared with patient.  MDM          Jimmye Norman, NP 03/15/12 [phone]

## 2012-03-14 NOTE — ED Notes (Signed)
Patient complaining of left sided chest pain that began an hour ago; patient reports palpitations and shortness of breath.  Denies nausea, vomiting, and diarrhea.  Patient also complaining of back pain that started several days ago.  History of HTN, hyperlipidemia, and AAA.

## 2012-03-15 ENCOUNTER — Other Ambulatory Visit: Payer: Medicaid Other

## 2012-03-15 NOTE — ED Provider Notes (Signed)
Medical screening examination/treatment/procedure(s) were performed by non-physician practitioner and as supervising physician I was immediately available for consultation/collaboration.   Carleene Cooper III, MD 03/15/12 1257

## 2012-03-22 ENCOUNTER — Other Ambulatory Visit: Payer: Medicaid Other

## 2012-03-26 ENCOUNTER — Ambulatory Visit
Admission: RE | Admit: 2012-03-26 | Discharge: 2012-03-26 | Disposition: A | Discharge status: Home / Self Care | Payer: Medicaid Other | Source: Ambulatory Visit | Attending: Physician Assistant | Admitting: Physician Assistant

## 2012-03-26 DIAGNOSIS — M545 Low back pain: Secondary | ICD-10-CM

## 2012-04-25 ENCOUNTER — Ambulatory Visit: Payer: Medicaid Other | Admitting: Neurosurgery

## 2012-04-30 ENCOUNTER — Other Ambulatory Visit (HOSPITAL_COMMUNITY): Payer: Self-pay | Admitting: Internal Medicine

## 2012-04-30 DIAGNOSIS — N508 Other specified disorders of male genital organs: Secondary | ICD-10-CM

## 2012-05-02 ENCOUNTER — Ambulatory Visit (HOSPITAL_COMMUNITY)
Admission: RE | Admit: 2012-05-02 | Discharge: 2012-05-02 | Disposition: A | Discharge status: Home / Self Care | Payer: Medicaid Other | Source: Ambulatory Visit | Attending: Internal Medicine | Admitting: Internal Medicine

## 2012-05-02 ENCOUNTER — Other Ambulatory Visit (HOSPITAL_COMMUNITY): Payer: Self-pay | Admitting: Internal Medicine

## 2012-05-02 DIAGNOSIS — N508 Other specified disorders of male genital organs: Secondary | ICD-10-CM

## 2012-05-02 DIAGNOSIS — N433 Hydrocele, unspecified: Secondary | ICD-10-CM | POA: Insufficient documentation

## 2012-05-29 ENCOUNTER — Encounter: Payer: Self-pay | Admitting: Vascular Surgery

## 2012-05-30 ENCOUNTER — Encounter: Payer: Self-pay | Admitting: Vascular Surgery

## 2012-05-30 ENCOUNTER — Encounter (INDEPENDENT_AMBULATORY_CARE_PROVIDER_SITE_OTHER): Payer: Medicaid Other | Admitting: *Deleted

## 2012-05-30 ENCOUNTER — Ambulatory Visit (INDEPENDENT_AMBULATORY_CARE_PROVIDER_SITE_OTHER): Payer: Medicaid Other | Admitting: Vascular Surgery

## 2012-05-30 VITALS — BP 117/77 | HR 65 | Ht 70.0 in | Wt 178.5 lb

## 2012-05-30 DIAGNOSIS — I714 Abdominal aortic aneurysm, without rupture: Secondary | ICD-10-CM

## 2012-05-30 DIAGNOSIS — Z48812 Encounter for surgical aftercare following surgery on the circulatory system: Secondary | ICD-10-CM

## 2012-05-30 NOTE — Progress Notes (Signed)
[facility] [place]  Established EVAR  History of Present Illness  [name] is a 62 y.o. (1950-08-11) male who presents for routine follow up s/p EVAR (Date: 06/21/10).  Most recent EVAR duplex (Date: 05/07/11) demonstrates: no endoleak and decreasing sac size. Most recent CT (Date: 04/27/11) demonstrates: no endoleak and decreasing sac size.  The patient has had chronic back pain and intermittent LLQ abdominal pain.  The pt has some evidence of diverticulosis on CT without an episodes of diverticulitis.  Past Medical History, Past Surgical History, Social History, Family History, Medications, Allergies, and Review of Systems are unchanged from previous evaluation on 04/27/11.  Physical Examination  Filed Vitals:   05/30/12 0929  BP: 117/77  Pulse: 65  Height: 5\' 10"  (1.778 m)  Weight: 178 lb 8 oz (80.967 kg)  SpO2: 100%   Body mass index is 25.61 kg/(m^2).  General: A&O x 3, WDWN  Eyes: PERRLA, EOMI  Pulmonary: Sym exp, good air movt, CTAB, no rales, rhonchi, & wheezing  Cardiac: RRR, Nl S1, S2, no Murmurs, rubs or gallops  Vascular: Vessel Right Left  Radial Palpable Palpable  Ulnar Palpable Palpable  Brachial Palpable Palpable  Carotid Palpable, without bruit Palpable, without bruit  Aorta Not palpable N/A  Femoral Palpable Palpable  Popliteal Not palpable Not palpable  PT Palpable Palpable  DP Palpable Palpable   Gastrointestinal: soft, NTND, -G/R, - HSM, - masses, - CVAT B  Musculoskeletal: M/S 5/5 throughout , Extremities without ischemic changes   Neurologic: Pain and light touch intact in extremities , Motor exam as listed above  Non-Invasive Vascular Imaging  EVAR Duplex (Date: 05/30/12)  AAA sac size: 3.71 cm x 3.82 cm (3.85 cm x 4.0 cm previous 04/27/11)  Unable to visualize endoleak or iliac limbs due to bowel gas  Medical Decision Making  [name] is a 62 y.o. male who presents s/p EVAR.  Pt is asymptomatic with decreasing  sac size.  The patient's AAA repair remains intact at this point.    I discussed with the patient the importance of surveillance of the endograft.  The next CT will be scheduled for 6 months.  This CTA will be the last CT for surveillance if the sac continues to be stable in size.    We will continue with EVAR duplex for surveillance after that CTA.  Thank you for allowing [place] to participate in this patient's care.  Leonides Sake, MD [facility] [place] Office: [phone] Pager: [phone]  05/30/2012, 9:50 AM

## 2012-05-30 NOTE — Addendum Note (Signed)
Addended by: Adria Dill L on: 05/30/2012 02:00 PM   Modules accepted: Orders

## 2012-09-05 ENCOUNTER — Observation Stay (HOSPITAL_COMMUNITY)
Admission: EM | Admit: 2012-09-05 | Discharge: 2012-09-07 | Disposition: A | Discharge status: Home / Self Care | Payer: Medicaid Other | Attending: Internal Medicine | Admitting: Internal Medicine

## 2012-09-05 ENCOUNTER — Encounter (HOSPITAL_COMMUNITY): Payer: Self-pay | Admitting: *Deleted

## 2012-09-05 ENCOUNTER — Emergency Department (HOSPITAL_COMMUNITY): Payer: Medicaid Other

## 2012-09-05 DIAGNOSIS — I10 Essential (primary) hypertension: Secondary | ICD-10-CM | POA: Diagnosis present

## 2012-09-05 DIAGNOSIS — F17210 Nicotine dependence, cigarettes, uncomplicated: Secondary | ICD-10-CM | POA: Diagnosis present

## 2012-09-05 DIAGNOSIS — E785 Hyperlipidemia, unspecified: Secondary | ICD-10-CM | POA: Insufficient documentation

## 2012-09-05 DIAGNOSIS — Z01810 Encounter for preprocedural cardiovascular examination: Secondary | ICD-10-CM

## 2012-09-05 DIAGNOSIS — I714 Abdominal aortic aneurysm, without rupture, unspecified: Secondary | ICD-10-CM | POA: Diagnosis present

## 2012-09-05 DIAGNOSIS — R079 Chest pain, unspecified: Principal | ICD-10-CM | POA: Diagnosis present

## 2012-09-05 DIAGNOSIS — I700 Atherosclerosis of aorta: Secondary | ICD-10-CM | POA: Insufficient documentation

## 2012-09-05 DIAGNOSIS — F17200 Nicotine dependence, unspecified, uncomplicated: Secondary | ICD-10-CM | POA: Insufficient documentation

## 2012-09-05 LAB — BASIC METABOLIC PANEL
BUN: 13 mg/dL (ref 6–23)
Calcium: 8.9 mg/dL (ref 8.4–10.5)
GFR calc Af Amer: 66 mL/min — ABNORMAL LOW (ref >90)
GFR calc non Af Amer: 57 mL/min — ABNORMAL LOW (ref >90)
Glucose, Bld: 112 mg/dL — ABNORMAL HIGH (ref 70–99)
Potassium: 3.7 mEq/L (ref 3.5–5.1)

## 2012-09-05 LAB — CBC
HCT: 39.5 % (ref 39.0–52.0)
Hemoglobin: 14 g/dL (ref 13.0–17.0)
MCH: 30.3 pg (ref 26.0–34.0)
MCHC: 35.4 g/dL (ref 30.0–36.0)
RDW: 13.1 % (ref 11.5–15.5)

## 2012-09-05 MED ORDER — ASPIRIN 81 MG PO CHEW
324.0000 mg | CHEWABLE_TABLET | Freq: Once | ORAL | Status: AC
  Administered 2012-09-05: 324 mg via ORAL
  Filled 2012-09-05: qty 4

## 2012-09-05 MED ORDER — MORPHINE SULFATE 4 MG/ML IJ SOLN
4.0000 mg | Freq: Once | INTRAMUSCULAR | Status: AC
  Administered 2012-09-06: 4 mg via INTRAVENOUS
  Filled 2012-09-05: qty 1

## 2012-09-05 NOTE — ED Provider Notes (Signed)
CSN: [id number]     Arrival date & time 09/05/12  2128 History     First MD Initiated Contact with Patient 09/05/12 2227     Chief Complaint  Patient presents with  . Chest Pain   (Consider location/radiation/quality/duration/timing/severity/associated sxs/prior Treatment) HPI History provided by pt.   Pt has had a constant, dull, non-pleuritic pain at left lower ant ribs for the past 4 days.  Onset diffuse, aching pain in LUE that radiated to L neck/jaw/chest the same day.  Arm pain returned today but was not as severe.  Has not had fever, cough, SOB, abd pain, nausea, diaphoresis.  Denies recent trauma to neck/chest/LUE.  Smokes 1PPD and has h/o HTN, high cholesterol, AAA and chronic neck/back pain.   Past Medical History  Diagnosis Date  . Back pain   . HTN (hypertension)   . Degenerative disk disease   . Hyperlipidemia   . Hypercholesterolemia   . AAA (abdominal aortic aneurysm)    Past Surgical History  Procedure Laterality Date  . Appendectomy    . Spinal fusion      of neck and lower back  . Evar  06/20/10    for AAA repair   . Back surgery     Family History  Problem Relation Age of Onset  . Atrial fibrillation Mother   . Heart disease Mother   . Stroke Father   . Heart disease Sister   . Diabetes Sister   . Heart disease Sister    History  Substance Use Topics  . Smoking status: Current Every Day Smoker -- 1.00 packs/day for 25 years    Types: Cigarettes  . Smokeless tobacco: Not on file  . Alcohol Use: No    Review of Systems  All other systems reviewed and are negative.    Allergies  Benazepril hcl and Pantoprazole sodium  Home Medications   Current Outpatient Rx  Name  Route  Sig  Dispense  Refill  . albuterol (PROVENTIL) (2.5 MG/3ML) 0.083% nebulizer solution   Nebulization   Take 2.5 mg by nebulization every 6 (six) hours as needed. Shortness of breath         . aspirin 81 MG tablet   Oral   Take 81 mg by mouth daily.          .  diazepam (VALIUM) 5 MG tablet   Oral   Take 5 mg by mouth every 6 (six) hours as needed. For anxiety         . esomeprazole (NEXIUM) 40 MG capsule   Oral   Take 40 mg by mouth daily before breakfast.          . losartan-hydrochlorothiazide (HYZAAR) 100-12.5 MG per tablet   Oral   Take 0.5 tablets by mouth daily.         Marland Kitchen oxycodone (OXY-IR) 5 MG capsule   Oral   Take 20 mg by mouth every 4 (four) hours as needed for pain.           BP 124/70  Pulse 57  Temp(Src) 98.4 F (36.9 C) (Oral)  Resp 16  Wt 175 lb (79.379 kg)  BMI 25.11 kg/m2  SpO2 97% Physical Exam  Nursing note and vitals reviewed. Constitutional: He is oriented to person, place, and time. He appears well-developed and well-nourished. No distress.  HENT:  Head: Normocephalic and atraumatic.  Eyes:  Normal appearance  Neck: Normal range of motion.  Cardiovascular: Normal rate, regular rhythm and intact distal pulses.  Pulmonary/Chest: Effort normal and breath sounds normal. No respiratory distress. He exhibits no tenderness.  No pleuritic pain reported  Abdominal: Soft. Bowel sounds are normal. He exhibits no distension. There is no tenderness. There is no guarding.  Musculoskeletal: Normal range of motion.  No peripheral edema or calf tenderness.  No tenderness of LUE and no pain w/ passive ROM.  Cervical spine non-tender.  Mild tenderness lumbar spine.  Neurological: He is alert and oriented to person, place, and time.  Skin: Skin is warm and dry. No rash noted.  Psychiatric: He has a normal mood and affect. His behavior is normal.    ED Course   Procedures (including critical care time)  Labs Reviewed  CBC - Abnormal; Notable for the following:    WBC 10.7 (*)    All other components within normal limits  BASIC METABOLIC PANEL - Abnormal; Notable for the following:    Glucose, Bld 112 (*)    GFR calc non Af Amer 57 (*)    GFR calc Af Amer 66 (*)    All other components within normal limits   POCT I-STAT TROPONIN I  POCT I-STAT TROPONIN I   Dg Chest 2 View  09/05/2012   *RADIOLOGY REPORT*  Clinical Data: Chest pain  CHEST - 2 VIEW  Comparison: Prior chest x-ray 03/14/2012  Findings: The lungs are well-aerated and free from pulmonary edema, focal airspace consolidation or pulmonary nodule.  Cardiac and mediastinal contours are within normal limits.  Trace atherosclerotic vascular calcifications noted along the transverse aorta.  No pneumothorax, or pleural effusion. No acute osseous findings.  IMPRESSION:  No acute cardiopulmonary disease.  Aortic atherosclerosis.   Original Report Authenticated By: Malachy Moan, M.D.   1. Chest pain   2. HTN (hypertension)     MDM  62yo M w/ HTN, high cholesterol, h/o AAA repair and chronic/neck pain, presents w/ intermittent, non-traumatic LUE pain and constant pain left lower ant chest x 4d.  No RF for or exam findings concerning for PE.  VS w/in nml range, no respiratory distress, pain not reproducible, no LE edema/tenderness.  EKG non-ischemic, CXR negative, troponin neg and labs otherwise unremarkable.  Discussed case w/ Dr. Manus Gunning and he agrees that characteristics of pain are concerning and patient has enough risk factors for ACS that warrant admission.   Pt received aspirin and IV morphine.  He is currently CP free. [facility] consulted for admission.    Otilio Miu, PA-C 09/07/12 [phone]

## 2012-09-05 NOTE — ED Notes (Signed)
Pt in c/o chest pain x2-3 days, states pain has been intermittent, radiates into left arm and into back, also at times into jaw, pt admits to some shortness of breath, states pain has worsened today

## 2012-09-06 ENCOUNTER — Encounter (HOSPITAL_COMMUNITY): Payer: Self-pay | Admitting: Internal Medicine

## 2012-09-06 DIAGNOSIS — R079 Chest pain, unspecified: Secondary | ICD-10-CM | POA: Diagnosis present

## 2012-09-06 DIAGNOSIS — I10 Essential (primary) hypertension: Secondary | ICD-10-CM

## 2012-09-06 LAB — POCT I-STAT TROPONIN I: Troponin i, poc: 0 ng/mL (ref 0.00–0.08)

## 2012-09-06 LAB — COMPREHENSIVE METABOLIC PANEL
ALT: 11 U/L (ref 0–53)
AST: 15 U/L (ref 0–37)
CO2: 26 mEq/L (ref 19–32)
Calcium: 8.8 mg/dL (ref 8.4–10.5)
Chloride: 107 mEq/L (ref 96–112)
Creatinine, Ser: 1.27 mg/dL (ref 0.50–1.35)
GFR calc Af Amer: 70 mL/min — ABNORMAL LOW (ref >90)
GFR calc non Af Amer: 60 mL/min — ABNORMAL LOW (ref >90)
Glucose, Bld: 95 mg/dL (ref 70–99)
Sodium: 142 mEq/L (ref 135–145)
Total Bilirubin: 0.3 mg/dL (ref 0.3–1.2)

## 2012-09-06 LAB — CBC WITH DIFFERENTIAL/PLATELET
Basophils Absolute: 0.1 10*3/uL (ref 0.0–0.1)
Basophils Relative: 1 % (ref 0–1)
Eosinophils Absolute: 1.5 10*3/uL — ABNORMAL HIGH (ref 0.0–0.7)
Hemoglobin: 13.9 g/dL (ref 13.0–17.0)
MCH: 30.8 pg (ref 26.0–34.0)
MCHC: 36.3 g/dL — ABNORMAL HIGH (ref 30.0–36.0)
Neutro Abs: 3.8 10*3/uL (ref 1.7–7.7)
Neutrophils Relative %: 36 % — ABNORMAL LOW (ref 43–77)
Platelets: 206 10*3/uL (ref 150–400)

## 2012-09-06 LAB — LIPID PANEL
Cholesterol: 168 mg/dL (ref 0–200)
Triglycerides: 172 mg/dL — ABNORMAL HIGH (ref <150)

## 2012-09-06 LAB — TROPONIN I: Troponin I: <0.3 ng/mL (ref <0.30)

## 2012-09-06 LAB — TSH: TSH: 2.199 u[IU]/mL (ref 0.350–4.500)

## 2012-09-06 MED ORDER — ACETAMINOPHEN 650 MG RE SUPP: 650.0000 mg | Freq: Four times a day (QID) | RECTAL | Status: DC | PRN

## 2012-09-06 MED ORDER — ASPIRIN EC 325 MG PO TBEC
325.0000 mg | DELAYED_RELEASE_TABLET | Freq: Every day | ORAL | Status: DC
  Administered 2012-09-06 – 2012-09-07 (×2): 325 mg via ORAL
  Filled 2012-09-06 (×2): qty 1

## 2012-09-06 MED ORDER — ONDANSETRON HCL 4 MG/2ML IJ SOLN: 4.0000 mg | Freq: Four times a day (QID) | INTRAMUSCULAR | Status: DC | PRN

## 2012-09-06 MED ORDER — NITROGLYCERIN 0.4 MG SL SUBL
0.4000 mg | SUBLINGUAL_TABLET | SUBLINGUAL | Status: DC | PRN
  Administered 2012-09-06 (×2): 0.4 mg via SUBLINGUAL

## 2012-09-06 MED ORDER — SODIUM CHLORIDE 0.9 % IJ SOLN
3.0000 mL | Freq: Two times a day (BID) | INTRAMUSCULAR | Status: DC
  Administered 2012-09-06: 3 mL via INTRAVENOUS

## 2012-09-06 MED ORDER — ACETAMINOPHEN 325 MG PO TABS: 650.0000 mg | ORAL_TABLET | Freq: Four times a day (QID) | ORAL | Status: DC | PRN

## 2012-09-06 MED ORDER — ONDANSETRON HCL 4 MG PO TABS: 4.0000 mg | ORAL_TABLET | Freq: Four times a day (QID) | ORAL | Status: DC | PRN

## 2012-09-06 MED ORDER — LOSARTAN POTASSIUM 50 MG PO TABS
100.0000 mg | ORAL_TABLET | Freq: Every day | ORAL | Status: DC
  Administered 2012-09-06 – 2012-09-07 (×2): 100 mg via ORAL
  Filled 2012-09-06 (×2): qty 2

## 2012-09-06 MED ORDER — OXYCODONE HCL 5 MG PO TABS
20.0000 mg | ORAL_TABLET | ORAL | Status: DC | PRN
  Administered 2012-09-06 – 2012-09-07 (×8): 20 mg via ORAL
  Filled 2012-09-06 (×8): qty 4

## 2012-09-06 MED ORDER — REGADENOSON 0.4 MG/5ML IV SOLN
0.4000 mg | Freq: Once | INTRAVENOUS | Status: DC
  Filled 2012-09-06: qty 5

## 2012-09-06 MED ORDER — MORPHINE SULFATE 2 MG/ML IJ SOLN: 1.0000 mg | INTRAMUSCULAR | Status: DC | PRN

## 2012-09-06 MED ORDER — ALBUTEROL SULFATE (5 MG/ML) 0.5% IN NEBU: 2.5000 mg | INHALATION_SOLUTION | RESPIRATORY_TRACT | Status: DC | PRN

## 2012-09-06 MED ORDER — DIAZEPAM 5 MG PO TABS
5.0000 mg | ORAL_TABLET | Freq: Four times a day (QID) | ORAL | Status: DC | PRN
  Administered 2012-09-07: 5 mg via ORAL
  Filled 2012-09-06: qty 1

## 2012-09-06 MED ORDER — ENOXAPARIN SODIUM 40 MG/0.4ML ~~LOC~~ SOLN
40.0000 mg | SUBCUTANEOUS | Status: DC
  Administered 2012-09-06 – 2012-09-07 (×2): 40 mg via SUBCUTANEOUS
  Filled 2012-09-06 (×2): qty 0.4

## 2012-09-06 MED ORDER — HYDROCHLOROTHIAZIDE 12.5 MG PO CAPS
12.5000 mg | ORAL_CAPSULE | Freq: Every day | ORAL | Status: DC
  Administered 2012-09-06 – 2012-09-07 (×2): 12.5 mg via ORAL
  Filled 2012-09-06 (×2): qty 1

## 2012-09-06 MED ORDER — LOSARTAN POTASSIUM-HCTZ 100-12.5 MG PO TABS: 0.5000 | ORAL_TABLET | Freq: Every day | ORAL | Status: DC

## 2012-09-06 MED ORDER — OXYCODONE HCL 5 MG PO CAPS: 20.0000 mg | ORAL_CAPSULE | ORAL | Status: DC | PRN

## 2012-09-06 NOTE — Progress Notes (Addendum)
[facility] PROGRESS NOTE  [name] WUJ:[id number] DOB: 1950/03/07 DOA: 09/05/2012 PCP: [name], PA-C  Assessment/Plan: Principal Problem:   Chest pain Active Problems:   Smoking   AAA (abdominal aortic aneurysm)   HTN (hypertension)    1. Chest pain: Patient presented with recurrent left-sided chest pain, since 09/03/12. CXR was devoid of active disease, 12-Lead EKG showed no acute ischemic changes, and cardiac enzymes are unelevated x 2. Following Morphine  administration in the ED, he has had no recurrence. Patient has multiple CV risk factors, so have consulted cardiology for stratification. On low dose ASA.  2. Abdominal aortic aneurysm status post repair: Stable/Asymptomatic. 3. Hypertension: Controlled on pre-admission antihypertensives. 4. Tobacco abuse: Counseled.  5. Dyslipidemia: Check Lipid profiel/TSH.    Code Status: Full Code.  Family Communication:  Disposition Plan: To be determined.    Brief narrative: 62 y.o. male with history of tobacco abuse, Dyslipidemia, DJD/Chronic back pain, s/p spinal fusion, hypertension and abdominal aortic aneurysm status post repair, presenting with complaints of chest pain, since 09/05/12. The chest pain started off in the left upper quadrant of the abdomen, then went into the left side of the chest, and radiated to the left hand. Pain is present at rest and has no relation to exertion. Denies any associated shortness of breath, productive cough, fever or chills. Pain lasts only for less than a minute each time, but recurred multiple times. In the ER EKG cardiac markers and chest x-ray were unremarkable. Patient's chest pain improved with Morphine. Patient denies nausea, vomiting, abdominal pain, fever or chills. Admitted for further evaluation and management.    Consultants:  N/A.   Procedures:  CXR.   Antibiotics:  N/A.   HPI/Subjective: Asymptomatic.  Objective: Vital signs in last 24 hours: Temp:   [98 F (36.7 C)-98.9 F (37.2 C)] 98 F (36.7 C) (08/16 0355) Pulse Rate:  [47-66] 51 (08/16 0355) Resp:  [14-20] 18 (08/16 0355) BP: (104-146)/(58-83) 130/79 mmHg (08/16 0355) SpO2:  [97 %-100 %] 98 % (08/16 0355) Weight:  [79.379 kg (175 lb)-81.874 kg (180 lb 8 oz)] 81.874 kg (180 lb 8 oz) (08/16 0355) Weight change:     Intake/Output from previous day:       Physical Exam: General: Comfortable, alert, communicative, fully oriented, not short of breath at rest.  HEENT:  No clinical pallor, no jaundice, no conjunctival injection or discharge. Hydration is fair.  NECK:  Supple, JVP not seen, no carotid bruits, no palpable lymphadenopathy, no palpable goiter. CHEST:  Clinically clear to auscultation, no wheezes, no crackles. HEART:  Sounds 1 and 2 heard, normal, regular, no murmurs. ABDOMEN:  Full, soft, non-tender, no palpable organomegaly, no palpable masses, normal bowel sounds. GENITALIA:  Not examined. LOWER EXTREMITIES:  No pitting edema, palpable peripheral pulses. MUSCULOSKELETAL SYSTEM:  Unremarkable. CENTRAL NERVOUS SYSTEM:  No focal neurologic deficit on gross examination.  Lab Results:  Recent Labs  09/05/12 2139  WBC 10.7*  HGB 14.0  HCT 39.5  PLT 216    Recent Labs  09/05/12 2139  NA 142  K 3.7  CL 106  CO2 27  GLUCOSE 112*  BUN 13  CREATININE 1.33  CALCIUM 8.9   No results found for this or any previous visit (from the past 240 hour(s)).   Studies/Results: Dg Chest 2 View  09/05/2012   *RADIOLOGY REPORT*  Clinical Data: Chest pain  CHEST - 2 VIEW  Comparison: Prior chest x-ray 03/14/2012  Findings: The lungs are well-aerated and free from pulmonary  edema, focal airspace consolidation or pulmonary nodule.  Cardiac and mediastinal contours are within normal limits.  Trace atherosclerotic vascular calcifications noted along the transverse aorta.  No pneumothorax, or pleural effusion. No acute osseous findings.  IMPRESSION:  No acute cardiopulmonary  disease.  Aortic atherosclerosis.   Original Report Authenticated By: Malachy Moan, M.D.    Medications: Scheduled Meds: . aspirin EC  325 mg Oral Daily  . enoxaparin (LOVENOX) injection  40 mg Subcutaneous Q24H  . hydrochlorothiazide  12.5 mg Oral Daily  . losartan  100 mg Oral Daily  . sodium chloride  3 mL Intravenous Q12H   Continuous Infusions:  PRN Meds:.acetaminophen, acetaminophen, albuterol, diazepam, morphine injection, nitroGLYCERIN, ondansetron (ZOFRAN) IV, ondansetron, oxyCODONE    LOS: 1 day   [name],[name]  [facility] Pager [phone]. If 8PM-8AM, please contact night-coverage at [email or web address], password [facility] 09/06/2012, 7:47 AM  LOS: 1 day

## 2012-09-06 NOTE — Progress Notes (Signed)
Pt is on Nexium. Kindly reorder for pt if  appropriate. Thanks .

## 2012-09-06 NOTE — Progress Notes (Signed)
Pt had c/o of on/off Left arm pain. EKG done . Ntg SL 1 tab given . Will continue to monitor.

## 2012-09-06 NOTE — H&P (Signed)
[facility] History and Physical  [name] ZOX:[id number] DOB: May 27, 1950 DOA: 09/05/2012  Referring physician: ER physician. PCP: William Nurse, PA-C   Chief Complaint: Chest pain.  HPI: [name] is a 62 y.o. male with history of hypertension and abdominal aortic aneurysm status post repair presents with complaints of chest pain. Patient has been having chest pain since yesterday. The chest pain started off in the left upper quadrant of the abdomen initially. The pain is left-sided and radiating to the left hand. Pain is present the headrest and has no relation to exertion. Denies any associated shortness of breath productive cough fever chills. Pain lasts only for less than a minute each time but recurs multiple times. In the ER EKG cardiac markers and chest x-ray were unremarkable. Patient's chest pain improved with morphine. At this time patient is chest pain-free and has been admitted for further management. Patient denies any nausea vomiting abdominal pain fever chills.  Review of Systems: As presented in the history of presenting illness, rest negative.  Past Medical History  Diagnosis Date  . Back pain   . HTN (hypertension)   . Degenerative disk disease   . Hyperlipidemia   . Hypercholesterolemia   . AAA (abdominal aortic aneurysm)    Past Surgical History  Procedure Laterality Date  . Appendectomy    . Spinal fusion      of neck and lower back  . Evar  06/20/10    for AAA repair   . Back surgery     Social History:  reports that he has been smoking Cigarettes.  He has a 25 pack-year smoking history. He does not have any smokeless tobacco history on file. He reports that he does not drink alcohol or use illicit drugs. Home. where does patient live-- Can do ADLs. Can patient participate in ADLs?  Allergies  Allergen Reactions  . Benazepril Hcl Other (See Comments)    Can't breath  . Pantoprazole Sodium Other (See Comments)    Can't breath     Family History  Problem Relation Age of Onset  . Atrial fibrillation Mother   . Heart disease Mother   . Stroke Father   . Heart disease Sister   . Diabetes Sister   . Heart disease Sister       Prior to Admission medications   Medication Sig Start Date End Date Taking? Authorizing Provider  albuterol (PROVENTIL) (2.5 MG/3ML) 0.083% nebulizer solution Take 2.5 mg by nebulization every 6 (six) hours as needed. Shortness of breath   Yes Historical Provider, MD  aspirin 81 MG tablet Take 81 mg by mouth daily.    Yes Historical Provider, MD  diazepam (VALIUM) 5 MG tablet Take 5 mg by mouth every 6 (six) hours as needed. For anxiety   Yes Historical Provider, MD  esomeprazole (NEXIUM) 40 MG capsule Take 40 mg by mouth daily before breakfast.    Yes Historical Provider, MD  losartan-hydrochlorothiazide (HYZAAR) 100-12.5 MG per tablet Take 0.5 tablets by mouth daily.   Yes Historical Provider, MD  oxycodone (OXY-IR) 5 MG capsule Take 20 mg by mouth every 4 (four) hours as needed for pain.    Yes Historical Provider, MD   Physical Exam: Filed Vitals:   09/06/12 0000 09/06/12 0031 09/06/12 0100 09/06/12 0200  BP: 135/83 123/75 115/63 104/58  Pulse: 66 53 49 47  Temp:      TempSrc:      Resp: 16 15 16 16   Weight:  SpO2: 100% 97% 97% 97%     General:  Well-developed well-nourished.  Eyes: Anicteric no pallor.  ENT: No discharge from ears eyes nose mouth.  Neck: No mass felt.  Cardiovascular: S1-S2 heard.  Respiratory: No rhonchi or crepitations.  Abdomen: Soft nontender bowel sounds present.  Skin: No rash.  Musculoskeletal: No edema.  Psychiatric: Appears normal.  Neurologic: Alert awake oriented to time place and person. Moves all extremities.  Labs on Admission:  Basic Metabolic Panel:  Recent Labs Lab 09/05/12 2139  NA 142  K 3.7  CL 106  CO2 27  GLUCOSE 112*  BUN 13  CREATININE 1.33  CALCIUM 8.9   Liver Function Tests: No results found for  this basename: AST, ALT, ALKPHOS, BILITOT, PROT, ALBUMIN,  in the last 168 hours No results found for this basename: LIPASE, AMYLASE,  in the last 168 hours No results found for this basename: AMMONIA,  in the last 168 hours CBC:  Recent Labs Lab 09/05/12 2139  WBC 10.7*  HGB 14.0  HCT 39.5  MCV 85.5  PLT 216   Cardiac Enzymes: No results found for this basename: CKTOTAL, CKMB, CKMBINDEX, TROPONINI,  in the last 168 hours  BNP (last 3 results) No results found for this basename: PROBNP,  in the last 8760 hours CBG: No results found for this basename: GLUCAP,  in the last 168 hours  Radiological Exams on Admission: Dg Chest 2 View  09/05/2012   *RADIOLOGY REPORT*  Clinical Data: Chest pain  CHEST - 2 VIEW  Comparison: Prior chest x-ray 03/14/2012  Findings: The lungs are well-aerated and free from pulmonary edema, focal airspace consolidation or pulmonary nodule.  Cardiac and mediastinal contours are within normal limits.  Trace atherosclerotic vascular calcifications noted along the transverse aorta.  No pneumothorax, or pleural effusion. No acute osseous findings.  IMPRESSION:  No acute cardiopulmonary disease.  Aortic atherosclerosis.   Original Report Authenticated By: [name], M.D.    EKG: Independently reviewed. Normal sinus rhythm with sinus bradycardia.  Assessment/Plan Principal Problem:   Chest pain Active Problems:   Smoking   AAA (abdominal aortic aneurysm)   HTN (hypertension)   1. Chest pain - given the risk factors including tobacco abuse and hypertension at this time we will rule out ACS. Cycle cardiac markers. Aspirin. Nitroglycerin sublingual when necessary. 2. Abdominal aortic aneurysm status post repair - denies any abdominal pain. 3. Hypertension - continue present medications. 4. Tobacco abuse - strongly advised to quit smoking.    Code Status: Full code.  Family Communication: Patient's wife at the bedside.  Disposition Plan: Admit for  observation.    William N. [facility] Pager [phone].  If 7PM-7AM, please contact night-coverage [email or web address] Password [email or web address] 09/06/2012, 2:30 AM

## 2012-09-06 NOTE — ED Notes (Signed)
Phlebotomy notified of POC lab to be drawn.

## 2012-09-06 NOTE — Consult Note (Signed)
Primary cardiologist: Dr. [name]  Clinical Summary [name] is a 62 y.o.male history of hypertension, hyperlipidemia, AAA status post EVAR in May 2012, no previously documented CAD or myocardial infarction. He was seen by Dr. Eden Emms prior to AAA intervention, no stress testing undertaken at that time as patient was clinically stable. He tells me that he did have a treadmill test perhaps 7 or 8 years ago.  He is now admitted with recent intermittent chest pain symptoms, left-sided, sometimes brief although also more prolonged with exertion occasionally. Has had some left arm discomfort, also up into his left-sided neck region.  ECG shows no acute ST segment changes, initial cardiac markers are normal.   Allergies  Allergen Reactions  . Benazepril Hcl Other (See Comments)    Can't breath  . Pantoprazole Sodium Other (See Comments)    Can't breath    Medications Scheduled Medications: . aspirin EC  325 mg Oral Daily  . enoxaparin (LOVENOX) injection  40 mg Subcutaneous Q24H  . hydrochlorothiazide  12.5 mg Oral Daily  . losartan  100 mg Oral Daily  . sodium chloride  3 mL Intravenous Q12H     PRN Medications:  acetaminophen, acetaminophen, albuterol, diazepam, morphine injection, nitroGLYCERIN, ondansetron (ZOFRAN) IV, ondansetron, oxyCODONE  Past Medical History  Diagnosis Date  . Back pain   . Essential hypertension, benign   . Degenerative disk disease   . Hyperlipidemia   . Hypercholesterolemia   . AAA (abdominal aortic aneurysm)     Past Surgical History  Procedure Laterality Date  . Appendectomy    . Spinal fusion      of neck and lower back  . Evar  06/20/10    for AAA repair   . Back surgery      Family History  Problem Relation Age of Onset  . Atrial fibrillation Mother   . Heart disease Mother   . Stroke Father   . Heart disease Sister   . Diabetes Sister   . Heart disease Sister     Social History Mr. Sigal reports that he has been  smoking Cigarettes.  He has a 25 pack-year smoking history. He does not have any smokeless tobacco history on file. Mr. Espin reports that he does not drink alcohol.  Review of Systems No cough, fevers or chills, palpitations, dizziness or syncope. Stable appetite. Otherwise negative.  Physical Examination Blood pressure 130/79, pulse 51, temperature 98 F (36.7 C), temperature source Oral, resp. rate 18, height 5\' 8"  (1.727 m), weight 180 lb 8 oz (81.874 kg), SpO2 98.00%.  Intake/Output Summary (Last 24 hours) at 09/06/12 1123 Last data filed at 09/06/12 0838  Gross per 24 hour  Intake    120 ml  Output      0 ml  Net    120 ml   Appears comfortable at rest, seated in bedside chair. HEENT: Conjunctiva and lids normal, oropharynx clear. Bearded. Neck: Supple, no elevated JVP or carotid bruits, no thyromegaly. Lungs: Clear to auscultation, nonlabored breathing at rest. Cardiac: Regular rate and rhythm, no S3 or significant systolic murmur, no pericardial rub. Abdomen: Soft, nontender, bowel sounds present, no guarding or rebound. Extremities: No pitting edema, distal pulses 2+. Skin: Warm and dry. Musculoskeletal: No kyphosis. Neuropsychiatric: Alert and oriented x3, affect grossly appropriate.   Lab Results Basic Metabolic Panel:  Recent Labs Lab 09/05/12 2139 09/06/12 0919  NA 142 142  K 3.7 3.4*  CL 106 107  CO2 27 26  GLUCOSE 112* 95  BUN 13 14  CREATININE 1.33 1.27  CALCIUM 8.9 8.8   Liver Function Tests:  Recent Labs Lab 09/06/12 0919  AST 15  ALT 11  ALKPHOS 39  BILITOT 0.3  PROT 6.2  ALBUMIN 3.3*   CBC:  Recent Labs Lab 09/05/12 2139 09/06/12 0919  WBC 10.7* 10.6*  NEUTROABS  --  3.8  HGB 14.0 13.9  HCT 39.5 38.3*  MCV 85.5 84.9  PLT 216 206   Cardiac Enzymes:  Recent Labs Lab 09/06/12 0914  TROPONINI <0.30    ECG Normal sinus rhythm with leftward axis.  Imaging CHEST - 2 VIEW  Comparison: Prior chest x-ray  03/14/2012  Findings: The lungs are well-aerated and free from pulmonary edema, focal airspace consolidation or pulmonary nodule. Cardiac and mediastinal contours are within normal limits. Trace atherosclerotic vascular calcifications noted along the transverse aorta. No pneumothorax, or pleural effusion. No acute osseous findings.  IMPRESSION:  No acute cardiopulmonary disease.  Aortic atherosclerosis.  Impression  1. Chest pain syndrome of recent onset, rule out accelerating angina, although ECG shows no dynamic ST segment changes initial cardiac markers are normal.  2. History of AAA status post EVAR in May 2012.  3. Hypertension.  4. Hyperlipidemia, LDL 107.  5. Tobacco abuse.  Recommendations  Will schedule for exercise Myoview presuming remaining cardiac markers remain normal and the patient maintains clinical stability.  Jonelle Sidle, M.D., F.A.C.C.

## 2012-09-07 ENCOUNTER — Other Ambulatory Visit (HOSPITAL_COMMUNITY): Payer: Medicaid Other

## 2012-09-07 ENCOUNTER — Observation Stay (HOSPITAL_COMMUNITY): Payer: Medicaid Other

## 2012-09-07 DIAGNOSIS — I714 Abdominal aortic aneurysm, without rupture: Secondary | ICD-10-CM

## 2012-09-07 DIAGNOSIS — F17200 Nicotine dependence, unspecified, uncomplicated: Secondary | ICD-10-CM

## 2012-09-07 DIAGNOSIS — R079 Chest pain, unspecified: Secondary | ICD-10-CM

## 2012-09-07 LAB — BASIC METABOLIC PANEL
BUN: 11 mg/dL (ref 6–23)
CO2: 25 mEq/L (ref 19–32)
Chloride: 106 mEq/L (ref 96–112)
Creatinine, Ser: 1.25 mg/dL (ref 0.50–1.35)
GFR calc Af Amer: 71 mL/min — ABNORMAL LOW (ref >90)
Glucose, Bld: 102 mg/dL — ABNORMAL HIGH (ref 70–99)
Potassium: 3.7 mEq/L (ref 3.5–5.1)

## 2012-09-07 MED ORDER — TECHNETIUM TC 99M SESTAMIBI - CARDIOLITE
10.0000 | Freq: Once | INTRAVENOUS | Status: AC | PRN
  Administered 2012-09-07: 10 via INTRAVENOUS

## 2012-09-07 MED ORDER — TECHNETIUM TC 99M SESTAMIBI - CARDIOLITE
30.0000 | Freq: Once | INTRAVENOUS | Status: AC | PRN
  Administered 2012-09-07: 10:00:00 30 via INTRAVENOUS

## 2012-09-07 NOTE — Discharge Summary (Signed)
Physician Discharge Summary  [name]:[id number] DOB: 10-24-1950 DOA: 09/05/2012  PCP: [name], PA-C  Admit date: 09/05/2012 Discharge date: 09/07/2012  Time spent: 40 minutes  Recommendations for Outpatient Follow-up:  1. Follow up with primary MD. 2. Follow up with Dr [name], primary cardiologist.   Discharge Diagnoses:  Principal Problem:   Chest pain Active Problems:   Smoking   AAA (abdominal aortic aneurysm)   HTN (hypertension)   Discharge Condition: Satisfactory.  Diet recommendation: Heart-Healthy.   Filed Weights   09/05/12 2136 09/06/12 0355  Weight: 79.379 kg (175 lb) 81.874 kg (180 lb 8 oz)    History of present illness:  62 y.o. male with history of tobacco abuse, Dyslipidemia, DJD/Chronic back pain, s/p spinal fusion, hypertension and abdominal aortic aneurysm status post repair, presenting with complaints of chest pain, since 09/05/12. The chest pain started off in the left upper quadrant of the abdomen, then went into the left side of the chest, and radiated to the left hand. Pain is present at rest and has no relation to exertion. Denies any associated shortness of breath, productive cough, fever or chills. Pain lasts only for less than a minute each time, but recurred multiple times. In the ER EKG cardiac markers and chest x-ray were unremarkable. Patient's chest pain improved with Morphine. Patient denies nausea, vomiting, abdominal pain, fever or chills. Admitted for further evaluation and management.    Hospital Course:  1. Chest pain: Patient presented with recurrent left-sided chest pain, since 09/03/12. CXR was devoid of active disease, 12-Lead EKG showed no acute ischemic changes, and cardiac enzymes remained unelevated. Following Morphine administration in the ED, he has had no recurrence, during the rest of his hospitalization. Placed on low dose ASA. Dr [name] provided cardiology consultation, and patient underwent stress  myoview on 09/07/12, which showed no evidence for stress induced ischemia, calculated ejection fraction of 70%, with normal wall motion. Decreased uptake along the inferolateral wall is a fixed defect and may be artifactual. Patient was cleared by cardiology for discharge, and has been reassured accordingly.  2. Abdominal aortic aneurysm status post repair: Stable/Asymptomatic.  3. Hypertension: Controlled on pre-admission antihypertensives.  4. Tobacco abuse: Counseled.  5. Dyslipidemia: Lipid profile showed TC 168, TC 172, HDL 27, LDL 107. TSH was 2.199.    Procedures:  See Below.  Stress Myoview.  Consultations:  Dr [name], cardiologist.   Discharge Exam: Filed Vitals:   09/07/12 1322  BP: 124/76  Pulse: 58  Temp: 97.7 F (36.5 C)  Resp: 20    General: Comfortable, alert, communicative, fully oriented, not short of breath at rest.  HEENT: No clinical pallor, no jaundice, no conjunctival injection or discharge. Hydration is fair.  NECK: Supple, JVP not seen, no carotid bruits, no palpable lymphadenopathy, no palpable goiter.  CHEST: Clinically clear to auscultation, no wheezes, no crackles.  HEART: Sounds 1 and 2 heard, normal, regular, no murmurs.  ABDOMEN: Full, soft, non-tender, no palpable organomegaly, no palpable masses, normal bowel sounds.  GENITALIA: Not examined.  LOWER EXTREMITIES: No pitting edema, palpable peripheral pulses.  MUSCULOSKELETAL SYSTEM: Unremarkable.  CENTRAL NERVOUS SYSTEM: No focal neurologic deficit on gross examination.  Discharge Instructions      Discharge Orders   Future Appointments Provider Department Dept Phone   12/05/2012 11:00 AM [name] IMAGING AT [facility] [phone]   12/05/2012 12:30 PM [name], MD [facility] [phone]   Future Orders Complete By Expires  Diet - low sodium heart healthy  As directed    Increase activity slowly  As directed         Medication List         albuterol (2.5 MG/3ML) 0.083% nebulizer solution  Commonly known as:  PROVENTIL  Take 2.5 mg by nebulization every 6 (six) hours as needed. Shortness of breath     aspirin 81 MG tablet  Take 81 mg by mouth daily.     esomeprazole 40 MG capsule  Commonly known as:  NEXIUM  Take 40 mg by mouth daily before breakfast.     losartan-hydrochlorothiazide 100-12.5 MG per tablet  Commonly known as:  HYZAAR  Take 0.5 tablets by mouth daily.     oxycodone 5 MG capsule  Commonly known as:  OXY-IR  Take 20 mg by mouth every 4 (four) hours as needed for pain.     VALIUM 5 MG tablet  Generic drug:  diazepam  Take 5 mg by mouth every 6 (six) hours as needed. For anxiety       Allergies  Allergen Reactions  . Benazepril Hcl Other (See Comments)    Can't breath  . Pantoprazole Sodium Other (See Comments)    Can't breath   Follow-up Information   Follow up with SUAREZ,J [name], PA-C.   Specialty:  Internal Medicine   Contact information:   [address] [name] [address] [phone]       Follow up with [name], MD. (Crdiology office will arrange appointment and contact you. )    Specialty:  Cardiology   Contact information:   [address] [place]lyn [place] [address] [phone]        The results of significant diagnostics from this hospitalization (including imaging, microbiology, ancillary and laboratory) are listed below for reference.    Significant Diagnostic Studies: Dg Chest 2 View  09/05/2012   *RADIOLOGY REPORT*  Clinical Data: Chest pain  CHEST - 2 VIEW  Comparison: Prior chest x-ray 03/14/2012  Findings: The lungs are well-aerated and free from pulmonary edema, focal airspace consolidation or pulmonary nodule.  Cardiac and mediastinal contours are within normal limits.  Trace atherosclerotic vascular calcifications noted along the transverse aorta.  No pneumothorax, or pleural  effusion. No acute osseous findings.  IMPRESSION:  No acute cardiopulmonary disease.  Aortic atherosclerosis.   Original Report Authenticated By: Malachy Moan, M.D.   Nm Myocar Multi W/spect W/wall Motion / Ef  09/07/2012   *RADIOLOGY REPORT*  Clinical Data:  Chest pain.  MYOCARDIAL IMAGING WITH SPECT (REST AND EXERCISE) GATED LEFT VENTRICULAR WALL MOTION STUDY LEFT VENTRICULAR EJECTION FRACTION  Technique:  Standard myocardial SPECT imaging was performed after resting intravenous injection of  10 mCi Tc-28m sestamibi. Subsequently, exercise tolerance test was performed by the patient under the supervision of the Cardiology staff.  At peak-stress, 30 mCi Tc-66m sestamibiwas injected intravenously and standard myocardial SPECT imaging was performed.  Quantitative gated imaging was also performed to evaluate left ventricular wall motion, and estimate left ventricular ejection fraction.  Comparison:  None.  Findings: There is decreased perfusion along the inferolateral wall in the mid and basilar segments.  This is present on both the rest and stress images.  There is no suspicious reversible defect.  The wall motion in the left ventricle is normal, including the lateral and inferior walls.  The calculated ejection fraction is 70%.  End- diastolic volume is 86 ml and end-systolic volume is 26 ml.  IMPRESSION: No evidence for stress induced ischemia.  Calculated ejection fraction is 70% with normal wall motion.  Decreased uptake along the inferolateral wall is a fixed defect and may be artifactual.   Original Report Authenticated By: Richarda Overlie, M.D.    Microbiology: No results found for this or any previous visit (from the past 240 hour(s)).   Labs: Basic Metabolic Panel:  Recent Labs Lab 09/05/12 2139 09/06/12 0919 09/07/12 0500  NA 142 142 141  K 3.7 3.4* 3.7  CL 106 107 106  CO2 27 26 25   GLUCOSE 112* 95 102*  BUN 13 14 11   CREATININE 1.33 1.27 1.25  CALCIUM 8.9 8.8 8.7   Liver Function  Tests:  Recent Labs Lab 09/06/12 0919  AST 15  ALT 11  ALKPHOS 39  BILITOT 0.3  PROT 6.2  ALBUMIN 3.3*   No results found for this basename: LIPASE, AMYLASE,  in the last 168 hours No results found for this basename: AMMONIA,  in the last 168 hours CBC:  Recent Labs Lab 09/05/12 2139 09/06/12 0919  WBC 10.7* 10.6*  NEUTROABS  --  3.8  HGB 14.0 13.9  HCT 39.5 38.3*  MCV 85.5 84.9  PLT 216 206   Cardiac Enzymes:  Recent Labs Lab 09/06/12 0914 09/06/12 1552  TROPONINI <0.30 <0.30   BNP: BNP (last 3 results) No results found for this basename: PROBNP,  in the last 8760 hours CBG: No results found for this basename: GLUCAP,  in the last 168 hours     Signed:  Jeziel Hoffmann,[name]  [facility] 09/07/2012, 2:05 PM

## 2012-09-07 NOTE — ED Provider Notes (Signed)
Medical screening examination/treatment/procedure(s) were conducted as a shared visit with non-physician practitioner(s) and myself.  I personally evaluated the patient during the encounter  Intermittent L arm and rib pain x 4 days.  Radiates to neck and jaw.  No SOB or nausea.   EKG nsr. HIstory concerning for angina.  Glynn Octave, MD 09/07/12 [phone]

## 2012-12-04 ENCOUNTER — Encounter: Payer: Self-pay | Admitting: Vascular Surgery

## 2012-12-05 ENCOUNTER — Encounter: Payer: Self-pay | Admitting: Vascular Surgery

## 2012-12-05 ENCOUNTER — Ambulatory Visit (INDEPENDENT_AMBULATORY_CARE_PROVIDER_SITE_OTHER): Payer: Medicaid Other | Admitting: Vascular Surgery

## 2012-12-05 ENCOUNTER — Ambulatory Visit
Admission: RE | Admit: 2012-12-05 | Discharge: 2012-12-05 | Disposition: A | Discharge status: Home / Self Care | Payer: Medicaid Other | Source: Ambulatory Visit | Attending: Vascular Surgery | Admitting: Vascular Surgery

## 2012-12-05 VITALS — BP 115/75 | HR 70 | Ht 68.0 in | Wt 178.0 lb

## 2012-12-05 DIAGNOSIS — I714 Abdominal aortic aneurysm, without rupture: Secondary | ICD-10-CM

## 2012-12-05 DIAGNOSIS — Z48812 Encounter for surgical aftercare following surgery on the circulatory system: Secondary | ICD-10-CM

## 2012-12-05 MED ORDER — IOHEXOL 350 MG/ML SOLN
75.0000 mL | Freq: Once | INTRAVENOUS | Status: AC | PRN
  Administered 2012-12-05: 75 mL via INTRAVENOUS

## 2012-12-05 NOTE — Progress Notes (Signed)
[facility] [place]  Established EVAR  History of Present Illness  [name] is a 62 y.o. ([birth year]) male who presents for routine follow up s/p EVAR (Date: 06/21/10).  Most recent EVAR duplex (Date: 05/30/12) demonstrates: no endoleak and shrinking sac size.  Most recent CTA (Date: 12/05/2012) demonstrates: no endoleak and no sac.  The patient has had chronic back pain now with some radiculopathy  The patient's PMH, PSH, SH, FamHx, Med, and Allergies are unchanged from 05/30/12.  On ROS today: no abdominal pain, persistent back pain due to spinal etiology  Physical Examination  Filed Vitals:   12/05/12 1157  BP: 115/75  Pulse: 70  Height: 5\' 8"  (1.727 m)  Weight: 178 lb (80.74 kg)  SpO2: 98%   Body mass index is 27.07 kg/(m^2).  General: A&O x 3, WDWN  Pulmonary: Sym exp, good air movt, CTAB, no rales, rhonchi, & wheezing  Cardiac: RRR, Nl S1, S2, no Murmurs, rubs or gallops  Vascular: Vessel Right Left  Radial Palpable Palpable  Brachial Palpable Palpable  Carotid Palpable, without bruit Palpable, without bruit  Aorta Not palpable N/A  Femoral Palpable Palpable  Popliteal Not palpable Not palpable  PT Palpable Palpable  DP Palpable Palpable   Gastrointestinal: soft, NTND, -G/R, - HSM, - masses, - CVAT B, no palpable AAA  Musculoskeletal: M/S 5/5 throughout , Extremities without ischemic changes , no palpable popliteal aneurysms  Neurologic: Pain and light touch intact in extremities , Motor exam as listed above  Non-Invasive Vascular Imaging  CTA abd/pelvis (12/05/2012)  Status post stent graft repair of the known abdominal aortic aneurysm.   No endoleak are complicating factors are noted.   The native aneurysm sac is almost non-existent at this time.   No new focal abnormality is seen. The remainder of this study is stable from the prior exam.  Based on my review of this patient's CTA, his endograft is in appropriate immediate  infrarenal position with widely patent renal arteries and SMA and CA.  There is no evidence of limb dysfunction.  The AAA sac is nearly non-existent.  Medical Decision Making  [name] is a 62 y.o. male who presents s/p EVAR.  Pt is asymptomatic with nearly gone AAA sac.  I discussed with the patient the importance of surveillance of the endograft.  The next endograft duplex will be scheduled for 12 months.  The patient will follow up with [place] in 1 year with my NP to review this study.    Given the near resolution of AAA sac in this patient, I doubt any benefit to repeat CTA, despite the late endoleak findings in the OVER trial.  Thank you for allowing [place] to participate in this patient's care.  Leonides Sake, MD [facility] Office: [phone] Pager: [phone]  12/05/2012, 12:15 PM

## 2013-03-09 ENCOUNTER — Telehealth: Payer: Self-pay | Admitting: *Deleted

## 2013-03-09 NOTE — Telephone Encounter (Signed)
Mr. [name] states he has been experiencing "stomach pain and pain in my left and right sides" for "about a month."   Mr. [name] states that the "stomach pain" is a sharp pain that lasts for 2-3 minutes and then goes away.  States the pain is intermittent. Mr. [name] denies light-headedness, loss of consciousness, nausea/ vommitting, or radiation of pain to his back.  Mr. [name] states he thinks he may have pulled "the muscles in my side" in November when pulling on a door.  Recommended that Mr. [name] call his Primary Care physician to have abdominal pain evaluated.  Mr. [name] verbalized understanding and states he will call his PCP in the morning and schedule an appointment.

## 2013-04-16 ENCOUNTER — Encounter (INDEPENDENT_AMBULATORY_CARE_PROVIDER_SITE_OTHER): Payer: Self-pay | Admitting: *Deleted

## 2013-05-07 ENCOUNTER — Ambulatory Visit (INDEPENDENT_AMBULATORY_CARE_PROVIDER_SITE_OTHER): Payer: Medicaid Other | Admitting: Internal Medicine

## 2013-05-07 ENCOUNTER — Encounter (INDEPENDENT_AMBULATORY_CARE_PROVIDER_SITE_OTHER): Payer: Self-pay | Admitting: *Deleted

## 2013-05-07 ENCOUNTER — Other Ambulatory Visit (INDEPENDENT_AMBULATORY_CARE_PROVIDER_SITE_OTHER): Payer: Self-pay | Admitting: *Deleted

## 2013-05-07 ENCOUNTER — Encounter (INDEPENDENT_AMBULATORY_CARE_PROVIDER_SITE_OTHER): Payer: Self-pay | Admitting: Internal Medicine

## 2013-05-07 VITALS — BP 100/58 | HR 60 | Temp 98.1°F | Ht 68.0 in | Wt 189.6 lb

## 2013-05-07 DIAGNOSIS — K219 Gastro-esophageal reflux disease without esophagitis: Secondary | ICD-10-CM

## 2013-05-07 DIAGNOSIS — R1310 Dysphagia, unspecified: Secondary | ICD-10-CM

## 2013-05-07 NOTE — Progress Notes (Signed)
Subjective:     Patient ID: [name], male   DOB: 1950-02-01, 63 y.o.   MRN: [id number]  HPI  Referred to our office for dysphagia and abdominal pain. He has some epigastric tenderness. Symptoms x 4 months.  Dysphagia has been occurring for years. Foods are very slow to go down. He denies having any acid reflux. Appetite is fair. He has early satiety.  There ha been no weight loss.  He usually has a BM at least every two days. No melena or bright red rectal bleeding. Last colonoscopy he thinks was by [place] ( I will try to find these reports) He says he has a hiatal hernia at [facility].   03/10/2013 ALP 45, AST 23, ALT 21, total bili 0.5, H and H 14.9 and 42.0., lipase 22. Review of Systems   Past Medical History  Diagnosis Date  . Back pain   . Essential hypertension, benign   . Degenerative disk disease   . Hyperlipidemia   . AAA (abdominal aortic aneurysm)     Past Surgical History  Procedure Laterality Date  . Appendectomy    . Spinal fusion      of neck and lower back  . Evar  06/20/10    for AAA repair   . Back surgery      Allergies  Allergen Reactions  . Benazepril Hcl Other (See Comments)    Can't breath  . Pantoprazole Sodium Other (See Comments)    Can't breath    Current Outpatient Prescriptions on File Prior to Visit  Medication Sig Dispense Refill  . albuterol (PROVENTIL) (2.5 MG/3ML) 0.083% nebulizer solution Take 2.5 mg by nebulization every 6 (six) hours as needed. Shortness of breath      . aspirin 81 MG tablet Take 81 mg by mouth daily.       . diazepam (VALIUM) 5 MG tablet Take 5 mg by mouth every 6 (six) hours as needed. For anxiety      . losartan-hydrochlorothiazide (HYZAAR) 100-12.5 MG per tablet Take 0.5 tablets by mouth daily.      Marland Kitchen oxycodone (OXY-IR) 5 MG capsule Take 20 mg by mouth every 4 (four) hours as needed for pain.       Marland Kitchen esomeprazole (NEXIUM) 40 MG capsule Take 20 mg by mouth daily before breakfast.        No current  facility-administered medications on file prior to visit.        Objective:   Physical Exam   Filed Vitals:   05/07/13 1110  BP: 100/58  Pulse: 60  Temp: 98.1 F (36.7 C)  Height: 5\' 8"  (1.727 m)  Weight: 189 lb 9.6 oz (86.002 kg)   Alert and oriented. Skin warm and dry. Oral mucosa is moist.   . Sclera anicteric, conjunctivae is pink. Thyroid not enlarged. No cervical lymphadenopathy. Lungs clear. Heart regular rate and rhythm.  Abdomen is soft. Bowel sounds are positive. No hepatomegaly. No abdominal masses felt. No tenderness.  No edema to lower extremities.       Assessment:    Dysphagia to solids. Stricture needs to be ruled out.   GERD: Presently taking Nexium 20mg  daily which is not controlled at this time.     Plan:     EGD/ED.The risks and benefits such as perforation, bleeding, and infection were reviewed with the patient and is agreeable. Take Nexium 30 minutes before breakfast and 30 minutes before supper.

## 2013-05-07 NOTE — Patient Instructions (Signed)
EGD/ED with Dr. Rehman. 

## 2013-05-22 ENCOUNTER — Encounter (HOSPITAL_COMMUNITY): Payer: Self-pay | Admitting: Pharmacy Technician

## 2013-05-28 ENCOUNTER — Encounter (HOSPITAL_COMMUNITY): Payer: Self-pay

## 2013-05-28 ENCOUNTER — Encounter (HOSPITAL_COMMUNITY)
Admission: RE | Admit: 2013-05-28 | Discharge: 2013-05-28 | Disposition: A | Discharge status: Home / Self Care | Payer: Medicaid Other | Source: Ambulatory Visit | Attending: Internal Medicine | Admitting: Internal Medicine

## 2013-05-28 HISTORY — DX: Gastro-esophageal reflux disease without esophagitis: K21.9

## 2013-05-28 HISTORY — DX: Torticollis: M43.6

## 2013-05-28 NOTE — Patient Instructions (Signed)
[name]  05/28/2013   Your procedure is scheduled on:   06/04/2013  Report to [facility] at  81  AM.  Call this number if you have problems the morning of surgery: [phone]   Remember:   Do not eat food or drink liquids after midnight.   Take these medicines the morning of surgery with A SIP OF WATER:  Valium, nexium, neurontin, hyzaar, oxycodone   Do not wear jewelry, make-up or nail polish.  Do not wear lotions, powders, or perfumes.   Do not shave 48 hours prior to surgery. Men may shave face and neck.  Do not bring valuables to the hospital.  [facility] is not responsible for any belongings or valuables.               Contacts, dentures or bridgework may not be worn into surgery.  Leave suitcase in the car. After surgery it may be brought to your room.  For patients admitted to the hospital, discharge time is determined by your treatment team.               Patients discharged the day of surgery will not be allowed to drive home.  Name and phone number of your driver: family  Special Instructions: N/A   Please read over the following fact sheets that you were given: Pain Booklet, Coughing and Deep Breathing, Surgical Site Infection Prevention, Anesthesia Post-op Instructions and Care and Recovery After Surgery Esophageal Dilatation The esophagus is the long, narrow tube which carries food and liquid from the mouth to the stomach. Esophageal dilatation is the technique used to stretch a blocked or narrowed portion of the esophagus. This procedure is used when a part of the esophagus has become so narrow that it becomes difficult, painful or even impossible to swallow. This is generally an uncomplicated form of treatment. When this is not successful, chest surgery may be required. This is a much more extensive form of treatment with a longer recovery time. CAUSES  Some of the more common causes of blockage or strictures of the esophagus are:  Narrowing from  longstanding inflammation (soreness and redness) of the lower esophagus. This comes from the constant exposure of the lower esophagus to the acid which bubbles up from the stomach. Over time this causes scarring and narrowing of the lower esophagus.  Hiatal hernia in which a small part of the stomach bulges (herniates) up through the diaphragm. This can cause a gradual narrowing of the end of the esophagus.  Schatzki's Ring is a narrow ring of benign (non-cancerous) fibrous tissue which constricts the lower esophagus. The reason for this is not known.  Scleroderma is a connective tissue disorder that affects the esophagus and makes swallowing difficult.  Achalasia is an absence of nerves to the lower esophagus and to the esophageal sphincter. This is the circular muscle between the stomach and esophagus that relaxes to allow food into the stomach. After swallowing, it contracts to keep food in the stomach. This absence of nerves may be congenital (present since birth). This can cause irregular spasms of the lower esophageal muscle. This spasm does not open up to allow food and fluid through. The result is a persistent blockage with subsequent slow trickling of the esophageal contents into the stomach.  Strictures may develop from swallowing materials which damage the esophagus. Some examples are strong acids or alkalis such as lye.  Growths such as benign (non-cancerous) and malignant (cancerous) tumors  can block the esophagus.  Heredity (present since birth) causes. DIAGNOSIS  Your caregiver often suspects this problem by taking a medical history. They will also do a physical exam. They can then prove their suspicions using X-rays and endoscopy. Endoscopy is an exam in which a tube like a small flexible telescope is used to look at your esophagus.  TREATMENT There are different stretching (dilating) techniques which can be used. Simple bougie dilatation may be done in the office. This usually  takes only a couple minutes. A numbing (anesthetic) spray of the throat is used. Endoscopy, when done, is done in an endoscopy suite, under mild sedation. When fluoroscopy is used, the procedure is performed in X-ray. Other techniques require a little longer time. Recovery is usually quick. There is no waiting time to begin eating and drinking to test success of the treatment. Following are some of the methods used. Narrowing of the esophagus is treated by making it bigger. Commonly this is a mechanical problem which can be treated with stretching. This can be done in different ways. Your caregiver will discuss these with you. Some of the means used are:  A series of graduated (increasing thickness) flexible dilators can be used. These are weighted tubes passed through the esophagus into the stomach. The tubes used become progressively larger until the desired stretched size is reached. Graduated dilators are a simple and quick way of opening the esophagus. No visualization is required.  Another method is the use of endoscopy to place a flexible wire across the stricture. The endoscope is removed and the wire left in place. A dilator with a hole through it from end to end is guided down the esophagus and across the stricture. One or more of these dilators are passed over the wire. At the end of the exam, the wire is removed. This type of treatment may be performed in the X-ray department under fluoroscopy. An advantage of this procedure is the examiner is visualizing the end opening in the esophagus.  Stretching of the esophagus may be done using balloons. Deflated balloons are placed through the endoscope and across the stricture. This type of balloon dilatation is often done at the time of endoscopy or fluoroscopy. Flexible endoscopy allows the examiner to directly view the stricture. A balloon is inserted in the deflated form into the area of narrowing. It is then inflated with air to a certain pressure  that is pre-set for a given circumference. When inflated, it becomes sausage shaped, stretched, and makes the stricture larger.  Achalasia requires a longer larger balloon-type dilator. This is frequently done under X-ray control. In this situation, the spastic muscle fibers in the lower esophagus are stretched. All of the above procedures make the passage of food and water into the stomach easier. They also make it easier for stomach contents to reflux back into the esophagus. Special medications may be used following the procedure to help prevent further stricturing. Proton-pump inhibitor medications are good at decreasing the amount of acid in the stomach juice. When stomach juice refluxes into the esophagus, the juice is no longer as acidic and is less likely to burn or scar the esophagus. RISKS AND COMPLICATIONS Esophageal dilatation is usually performed effectively and without problems. Some complications that can occur are:  A small amount of bleeding almost always happens where the stretching takes place. If this is too excessive it may require more aggressive treatment.  An uncommon complication is perforation (making a hole) of the esophagus. The esophagus  is thin. It is easy to make a hole in it. If this happens, an operation may be necessary to repair this.  A small, undetected perforation could lead to an infection in the chest. This can be very serious. HOME CARE INSTRUCTIONS   If you received sedation for your procedure, do not drive, make important decisions, or perform any activities requiring your full coordination. Do not drink alcohol, take sedatives, or use any mind altering chemicals unless instructed by your caregiver.  You may use throat lozenges or warm salt water gargles if you have throat discomfort  You can begin eating and drinking normally on return home unless instructed otherwise. Do not purposely try to force large chunks of food down to test the benefits of your  procedure.  Mild discomfort can be eased with sips of ice water.  Medications for discomfort may or may not be needed. SEEK IMMEDIATE MEDICAL CARE IF:   You begin vomiting up blood.  You develop black tarry stools  You develop chills or an unexplained temperature of over 101 F (38.3 C)  You develop chest or abdominal pain.  You develop shortness of breath or feel lightheaded or faint.  Your swallowing is becoming more painful, difficult, or you are unable to swallow. MAKE SURE YOU:   Understand these instructions.  Will watch your condition.  Will get help right away if you are not doing well or get worse. Document Released: 03/01/2005 Document Revised: 04/02/2011 Document Reviewed: 04/18/2005 [facility] Patient Information 2014 [place]. Esophagogastroduodenoscopy Esophagogastroduodenoscopy (EGD) is a procedure to examine the lining of the esophagus, stomach, and first part of the small intestine (duodenum). A long, flexible, lighted tube with a camera attached (endoscope) is inserted down the throat to view these organs. This procedure is done to detect problems or abnormalities, such as inflammation, bleeding, ulcers, or growths, in order to treat them. The procedure lasts about 5 20 minutes. It is usually an outpatient procedure, but it may need to be performed in emergency cases in the hospital. LET YOUR CAREGIVER KNOW ABOUT:   Allergies to food or medicine.  All medicines you are taking, including vitamins, herbs, eyedrops, and over-the-counter medicines and creams.  Use of steroids (by mouth or creams).  Previous problems you or members of your family have had with the use of anesthetics.  Any blood disorders you have.  Previous surgeries you have had.  Other health problems you have.  Possibility of pregnancy, if this applies. RISKS AND COMPLICATIONS  Generally, EGD is a safe procedure. However, as with any procedure, complications can occur. Possible  complications include:  Infection.  Bleeding.  Tearing (perforation) of the esophagus, stomach, or duodenum.  Difficulty breathing or not being able to breath.  Excessive sweating.  Spasms of the larynx.  Slowed heartbeat.  Low blood pressure. BEFORE THE PROCEDURE  Do not eat or drink anything for 6 8 hours before the procedure or as directed by your caregiver.  Ask your caregiver about changing or stopping your regular medicines.  If you wear dentures, be prepared to remove them before the procedure.  Arrange for someone to drive you home after the procedure. PROCEDURE   A vein will be accessed to give medicines and fluids. A medicine to relax you (sedative) and a pain reliever will be given through that access into the vein.  A numbing medicine (local anesthetic) may be sprayed on your throat for comfort and to stop you from gagging or coughing.  A mouth guard may  be placed in your mouth to protect your teeth and to keep you from biting on the endoscope.  You will be asked to lie on your left side.  The endoscope is inserted down your throat and into the esophagus, stomach, and duodenum.  Air is put through the endoscope to allow your caregiver to view the lining of your esophagus clearly.  The esophagus, stomach, and duodenum is then examined. During the exam, your caregiver may:  Remove tissue to be examined under a microscope (biopsy) for inflammation, infection, or other medical problems.  Remove growths.  Remove objects (foreign bodies) that are stuck.  Treat any bleeding with medicines or other devices that stop tissues from bleeding (hot cauters, clipping devices).  Widen (dilate) or stretch narrowed areas of the esophagus and stomach.  The endoscope will then be withdrawn. AFTER THE PROCEDURE  You will be taken to a recovery area to be monitored. You will be able to go home once you are stable and alert.  Do not eat or drink anything until the local  anesthetic and numbing medicines have worn off. You may choke.  It is normal to feel bloated, have pain with swallowing, or have a sore throat for a short time. This will wear off.  Your caregiver should be able to discuss his or her findings with you. It will take longer to discuss the test results if any biopsies were taken. Document Released: 05/11/2004 Document Revised: 12/26/2011 Document Reviewed: 12/12/2011 [facility] Patient Information 2014 [place], [place]. PATIENT INSTRUCTIONS POST-ANESTHESIA  IMMEDIATELY FOLLOWING SURGERY:  Do not drive or operate machinery for the first twenty four hours after surgery.  Do not make any important decisions for twenty four hours after surgery or while taking narcotic pain medications or sedatives.  If you develop intractable nausea and vomiting or a severe headache please notify your doctor immediately.  FOLLOW-UP:  Please make an appointment with your surgeon as instructed. You do not need to follow up with anesthesia unless specifically instructed to do so.  WOUND CARE INSTRUCTIONS (if applicable):  Keep a dry clean dressing on the anesthesia/puncture wound site if there is drainage.  Once the wound has quit draining you may leave it open to air.  Generally you should leave the bandage intact for twenty four hours unless there is drainage.  If the epidural site drains for more than 36-48 hours please call the anesthesia department.  QUESTIONS?:  Please feel free to call your physician or the hospital operator if you have any questions, and they will be happy to assist you.

## 2013-06-10 ENCOUNTER — Encounter (HOSPITAL_COMMUNITY): Payer: Medicaid Other | Admitting: Anesthesiology

## 2013-06-10 ENCOUNTER — Encounter (HOSPITAL_COMMUNITY)
Admission: RE | Disposition: A | Discharge status: Home / Self Care | Payer: Self-pay | Source: Ambulatory Visit | Attending: Internal Medicine

## 2013-06-10 ENCOUNTER — Ambulatory Visit (HOSPITAL_COMMUNITY): Payer: Medicaid Other | Admitting: Anesthesiology

## 2013-06-10 ENCOUNTER — Ambulatory Visit (HOSPITAL_COMMUNITY)
Admission: RE | Admit: 2013-06-10 | Discharge: 2013-06-10 | Disposition: A | Discharge status: Home / Self Care | Payer: Medicaid Other | Source: Ambulatory Visit | Attending: Internal Medicine | Admitting: Internal Medicine

## 2013-06-10 ENCOUNTER — Encounter (HOSPITAL_COMMUNITY): Payer: Self-pay | Admitting: *Deleted

## 2013-06-10 DIAGNOSIS — Z7982 Long term (current) use of aspirin: Secondary | ICD-10-CM | POA: Insufficient documentation

## 2013-06-10 DIAGNOSIS — Q393 Congenital stenosis and stricture of esophagus: Secondary | ICD-10-CM

## 2013-06-10 DIAGNOSIS — I10 Essential (primary) hypertension: Secondary | ICD-10-CM | POA: Insufficient documentation

## 2013-06-10 DIAGNOSIS — F17200 Nicotine dependence, unspecified, uncomplicated: Secondary | ICD-10-CM | POA: Insufficient documentation

## 2013-06-10 DIAGNOSIS — K449 Diaphragmatic hernia without obstruction or gangrene: Secondary | ICD-10-CM | POA: Insufficient documentation

## 2013-06-10 DIAGNOSIS — J449 Chronic obstructive pulmonary disease, unspecified: Secondary | ICD-10-CM | POA: Insufficient documentation

## 2013-06-10 DIAGNOSIS — K228 Other specified diseases of esophagus: Secondary | ICD-10-CM

## 2013-06-10 DIAGNOSIS — Z888 Allergy status to other drugs, medicaments and biological substances status: Secondary | ICD-10-CM | POA: Insufficient documentation

## 2013-06-10 DIAGNOSIS — K219 Gastro-esophageal reflux disease without esophagitis: Secondary | ICD-10-CM

## 2013-06-10 DIAGNOSIS — IMO0002 Reserved for concepts with insufficient information to code with codable children: Secondary | ICD-10-CM | POA: Insufficient documentation

## 2013-06-10 DIAGNOSIS — I739 Peripheral vascular disease, unspecified: Secondary | ICD-10-CM | POA: Insufficient documentation

## 2013-06-10 DIAGNOSIS — I714 Abdominal aortic aneurysm, without rupture, unspecified: Secondary | ICD-10-CM | POA: Insufficient documentation

## 2013-06-10 DIAGNOSIS — J4489 Other specified chronic obstructive pulmonary disease: Secondary | ICD-10-CM | POA: Insufficient documentation

## 2013-06-10 DIAGNOSIS — R1310 Dysphagia, unspecified: Secondary | ICD-10-CM

## 2013-06-10 DIAGNOSIS — E785 Hyperlipidemia, unspecified: Secondary | ICD-10-CM | POA: Insufficient documentation

## 2013-06-10 DIAGNOSIS — K2289 Other specified disease of esophagus: Secondary | ICD-10-CM

## 2013-06-10 DIAGNOSIS — Z79899 Other long term (current) drug therapy: Secondary | ICD-10-CM | POA: Insufficient documentation

## 2013-06-10 DIAGNOSIS — Q391 Atresia of esophagus with tracheo-esophageal fistula: Secondary | ICD-10-CM | POA: Insufficient documentation

## 2013-06-10 HISTORY — PX: BIOPSY: SHX5522

## 2013-06-10 HISTORY — PX: ESOPHAGOGASTRODUODENOSCOPY (EGD) WITH PROPOFOL: SHX5813

## 2013-06-10 HISTORY — PX: MALONEY DILATION: SHX5535

## 2013-06-10 SURGERY — ESOPHAGOGASTRODUODENOSCOPY (EGD) WITH PROPOFOL
Anesthesia: Monitor Anesthesia Care

## 2013-06-10 MED ORDER — FENTANYL CITRATE 0.05 MG/ML IJ SOLN
25.0000 ug | INTRAMUSCULAR | Status: AC
  Administered 2013-06-10: 25 ug via INTRAVENOUS

## 2013-06-10 MED ORDER — STERILE WATER FOR IRRIGATION IR SOLN
Status: DC | PRN
  Administered 2013-06-10: 07:00:00

## 2013-06-10 MED ORDER — ONDANSETRON HCL 4 MG/2ML IJ SOLN
INTRAMUSCULAR | Status: AC
  Filled 2013-06-10: qty 2

## 2013-06-10 MED ORDER — MIDAZOLAM HCL 2 MG/2ML IJ SOLN
INTRAMUSCULAR | Status: AC
  Filled 2013-06-10: qty 2

## 2013-06-10 MED ORDER — LACTATED RINGERS IV SOLN
INTRAVENOUS | Status: DC
  Administered 2013-06-10: 07:00:00 via INTRAVENOUS

## 2013-06-10 MED ORDER — ONDANSETRON HCL 4 MG/2ML IJ SOLN: 4.0000 mg | Freq: Once | INTRAMUSCULAR | Status: DC | PRN

## 2013-06-10 MED ORDER — FENTANYL CITRATE 0.05 MG/ML IJ SOLN: 25.0000 ug | INTRAMUSCULAR | Status: DC | PRN

## 2013-06-10 MED ORDER — ONDANSETRON HCL 4 MG/2ML IJ SOLN
4.0000 mg | Freq: Once | INTRAMUSCULAR | Status: AC
  Administered 2013-06-10: 4 mg via INTRAVENOUS

## 2013-06-10 MED ORDER — PROPOFOL 10 MG/ML IV BOLUS
INTRAVENOUS | Status: AC
  Filled 2013-06-10: qty 20

## 2013-06-10 MED ORDER — FENTANYL CITRATE 0.05 MG/ML IJ SOLN
INTRAMUSCULAR | Status: AC
  Filled 2013-06-10: qty 2

## 2013-06-10 MED ORDER — GLYCOPYRROLATE 0.2 MG/ML IJ SOLN
INTRAMUSCULAR | Status: AC
  Filled 2013-06-10: qty 1

## 2013-06-10 MED ORDER — BUTAMBEN-TETRACAINE-BENZOCAINE 2-2-14 % EX AERO
1.0000 | INHALATION_SPRAY | Freq: Once | CUTANEOUS | Status: AC
  Administered 2013-06-10: 1 via TOPICAL
  Filled 2013-06-10: qty 56

## 2013-06-10 MED ORDER — PROPOFOL INFUSION 10 MG/ML OPTIME
INTRAVENOUS | Status: DC | PRN
  Administered 2013-06-10: 125 ug/kg/min via INTRAVENOUS

## 2013-06-10 MED ORDER — GLYCOPYRROLATE 0.2 MG/ML IJ SOLN
0.2000 mg | Freq: Once | INTRAMUSCULAR | Status: AC
  Administered 2013-06-10: 0.2 mg via INTRAVENOUS

## 2013-06-10 MED ORDER — LIDOCAINE HCL (PF) 1 % IJ SOLN
INTRAMUSCULAR | Status: AC
  Filled 2013-06-10: qty 5

## 2013-06-10 MED ORDER — MIDAZOLAM HCL 2 MG/2ML IJ SOLN
1.0000 mg | INTRAMUSCULAR | Status: DC | PRN
  Administered 2013-06-10: 2 mg via INTRAVENOUS

## 2013-06-10 SURGICAL SUPPLY — 33 items
BALLN CRE LF 10-12 240X5.5 (BALLOONS)
BALLN CRE LF 10-12MM 240X5.5 (BALLOONS)
BALLN DILATOR CRE 12-15 240 (BALLOONS)
BALLN DILATOR CRE 15-18 240 (BALLOONS) IMPLANT
BALLN DILATOR CRE 18-20 240 (BALLOONS) IMPLANT
BALLN DILATOR CRE WIREGUIDE (BALLOONS)
BALLOON CRE LF 10-12 240X5.5 (BALLOONS) IMPLANT
BALLOON DILATOR CRE 12-15 240 (BALLOONS) IMPLANT
BALLOON DILATOR CRE WIREGUIDE (BALLOONS) IMPLANT
BLOCK BITE 60FR ADLT L/F BLUE (MISCELLANEOUS) ×3 IMPLANT
ELECT REM PT RETURN 9FT ADLT (ELECTROSURGICAL)
ELECTRODE REM PT RTRN 9FT ADLT (ELECTROSURGICAL) IMPLANT
FLOOR PAD 36X40 (MISCELLANEOUS) ×3
FORCEP COLD BIOPSY (CUTTING FORCEPS) IMPLANT
FORCEPS BIOP RAD 4 LRG CAP 4 (CUTTING FORCEPS) ×2 IMPLANT
FORMALIN 10 PREFIL 20ML (MISCELLANEOUS) ×2 IMPLANT
KIT CLEAN ENDO COMPLIANCE (KITS) IMPLANT
KIT ROOM TURNOVER APOR (KITS) ×2 IMPLANT
MANIFOLD NEPTUNE II (INSTRUMENTS) ×3 IMPLANT
NDL SCLEROTHERAPY 25GX240 (NEEDLE) IMPLANT
NEEDLE SCLEROTHERAPY 25GX240 (NEEDLE) IMPLANT
PAD FLOOR 36X40 (MISCELLANEOUS) ×1 IMPLANT
PROBE APC STR FIRE (PROBE) IMPLANT
PROBE INJECTION GOLD (MISCELLANEOUS)
PROBE INJECTION GOLD 7FR (MISCELLANEOUS) IMPLANT
SNARE ROTATE MED OVAL 20MM (MISCELLANEOUS) IMPLANT
SNARE SHORT THROW 13M SML OVAL (MISCELLANEOUS) ×1 IMPLANT
SYR 50ML LL SCALE MARK (SYRINGE) IMPLANT
SYR INFLATE BILIARY GAUGE (MISCELLANEOUS) IMPLANT
SYR INFLATION 60ML (SYRINGE) IMPLANT
TUBING ENDO SMARTCAP PENTAX (MISCELLANEOUS) ×3 IMPLANT
TUBING IRRIGATION ENDOGATOR (MISCELLANEOUS) ×3 IMPLANT
WATER STERILE IRR 1000ML POUR (IV SOLUTION) ×2 IMPLANT

## 2013-06-10 NOTE — Anesthesia Preprocedure Evaluation (Addendum)
Anesthesia Evaluation  Patient identified by MRN, date of birth, ID band Patient awake    Reviewed: Allergy & Precautions, H&P , NPO status , Patient's Chart, lab work & pertinent test results  Airway Mallampati: III TM Distance: >3 FB Neck ROM: Limited    Dental  (+) Edentulous Upper, Edentulous Lower   Pulmonary COPDCurrent Smoker,  breath sounds clear to auscultation        Cardiovascular hypertension, Pt. on medications + Peripheral Vascular Disease Rhythm:Regular Rate:Normal     Neuro/Psych    GI/Hepatic GERD- (dysphagia)  Medicated,  Endo/Other    Renal/GU      Musculoskeletal   Abdominal   Peds  Hematology   Anesthesia Other Findings   Reproductive/Obstetrics                          Anesthesia Physical Anesthesia Plan  ASA: III  Anesthesia Plan: MAC   Post-op Pain Management:    Induction: Intravenous  Airway Management Planned: Simple Face Mask  Additional Equipment:   Intra-op Plan:   Post-operative Plan:   Informed Consent: I have reviewed the patients History and Physical, chart, labs and discussed the procedure including the risks, benefits and alternatives for the proposed anesthesia with the patient or authorized representative who has indicated his/her understanding and acceptance.     Plan Discussed with:   Anesthesia Plan Comments:         Anesthesia Quick Evaluation

## 2013-06-10 NOTE — Transfer of Care (Signed)
Immediate Anesthesia Transfer of Care Note  Patient: [name]  Procedure(s) Performed: Procedure(s): ESOPHAGOGASTRODUODENOSCOPY (EGD) WITH PROPOFOL (N/A) MALONEY DILATION 52 fr, 72 fr (N/A)  Patient Location: PACU  Anesthesia Type:MAC  Level of Consciousness: awake and alert   Airway & Oxygen Therapy: Patient Spontanous Breathing and Patient connected to face mask oxygen  Post-op Assessment: Report given to PACU RN  Post vital signs: Reviewed and stable  Complications: No apparent anesthesia complications

## 2013-06-10 NOTE — H&P (Signed)
[name] is an 63 y.o. male.   Chief Complaint: Patient is here for EGD and ED. HPI: Patient is 63 year old Caucasian male who is alert 15 year history of heartburn who now presents with solid food dysphagia. The symptoms started about 12 months ago. He's having difficulty primarily with solids and morbid meats. He points to upper sternal area Sada bolus obstruction. He said food bolus eventually passes down after a minute or so when this occurs he also is chest pain. His heartburn was well-controlled while he was in prescription Nexium. He is not having to take 2 OTC Nexium pills in order to get relief. He denies abdominal pain or melena. He states he has gained a few pounds last year in spite of having dysphagia. Last EGD was 30 years ago when he was found to have GERD and hiatal hernia.  Past Medical History  Diagnosis Date  . Back pain   . Essential hypertension, benign   . Degenerative disk disease   . Hyperlipidemia   . AAA (abdominal aortic aneurysm)   . GERD (gastroesophageal reflux disease)   . COPD (chronic obstructive pulmonary disease)   . Cancer     skin cancer  . 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 Appendectomy    . Spinal fusion      of neck and lower back  . Evar  06/20/10    for AAA repair   . Back surgery      Family History  Problem Relation Age of Onset  . Atrial fibrillation Mother   . Heart disease Mother   . Stroke Father   . Heart disease Sister   . Diabetes Sister   . Heart disease Sister    Social History:  reports that he has been smoking Cigarettes.  He has a 25 pack-year smoking history. He does not have any smokeless tobacco history on file. He reports that he does not drink alcohol or use illicit drugs.  Allergies:  Allergies  Allergen Reactions  . Benazepril Hcl Other (See Comments)    Can't breathe  . Pantoprazole Sodium Other (See Comments)    Can't  breathe    Medications Prior to Admission  Medication Sig Dispense Refill  . albuterol (PROVENTIL) (2.5 MG/3ML) 0.083% nebulizer solution Take 2.5 mg by nebulization every 6 (six) hours as needed. Shortness of breath      . aspirin 81 MG tablet Take 81 mg by mouth daily.       . diazepam (VALIUM) 5 MG tablet Take 5 mg by mouth every 6 (six) hours as needed. For anxiety      . esomeprazole (NEXIUM) 20 MG capsule Take 20 mg by mouth daily at 12 noon.      . gabapentin (NEURONTIN) 400 MG capsule Take 400 mg by mouth 3 (three) times daily.      Marland Kitchen losartan-hydrochlorothiazide (HYZAAR) 100-12.5 MG per tablet Take 0.5 tablets by mouth daily.      Marland Kitchen oxycodone (OXY-IR) 5 MG capsule Take 20 mg by mouth every 4 (four) hours as needed for pain.         No results found for this or any previous visit (from the past 48 hour(s)). No results found.  ROS  Blood pressure 125/81, pulse 59, temperature 97.9 F (36.6 C), temperature source Oral, resp. rate 28, SpO2 93.00%. Physical Exam  Constitutional: He appears well-developed and well-nourished.  HENT:  Mouth/Throat: Oropharynx is clear and moist.  Eyes: Conjunctivae are normal. No scleral icterus.  Neck: No thyromegaly present.  Cardiovascular: Normal rate, regular rhythm and normal heart sounds.   No murmur heard. Respiratory: Effort normal and breath sounds normal.  GI: Soft. He exhibits no distension and no mass. There is no tenderness.  Musculoskeletal: He exhibits no edema.  Lymphadenopathy:    He has no cervical adenopathy.  Neurological: He is alert.  Skin: Skin is warm.     Assessment/Plan Solid food dysphagia in a patient with chronic GERD. EGD and ED under propofol.  Najeeb U Rehman 06/10/2013, 7:21 AM

## 2013-06-10 NOTE — Anesthesia Postprocedure Evaluation (Signed)
  Anesthesia Post-op Note  Patient: [name]  Procedure(s) Performed: Procedure(s): ESOPHAGOGASTRODUODENOSCOPY (EGD) WITH PROPOFOL (N/A) MALONEY DILATION 52 fr, 57 fr (N/A)  Patient Location: PACU  Anesthesia Type:MAC  Level of Consciousness: awake, alert  and oriented  Airway and Oxygen Therapy: Patient Spontanous Breathing and Patient connected to face mask oxygen  Post-op Pain: none  Post-op Assessment: Post-op Vital signs reviewed, Patient's Cardiovascular Status Stable, Respiratory Function Stable, Patent Airway and No signs of Nausea or vomiting  Post-op Vital Signs: Reviewed and stable  Last Vitals:  Filed Vitals:   06/10/13 0715  BP: 130/89  Pulse:   Temp:   Resp: 29    Complications: No apparent anesthesia complications

## 2013-06-10 NOTE — Op Note (Signed)
EGD PROCEDURE REPORT  PATIENT:  [name]  MR#:  [id number] Birthdate:  [birth year], 63 y.o., male Endoscopist:  Dr. Rogene Houston, MD Referred By:         Tammi Klippel, [facility]  Procedure Date: 06/10/2013  Procedure:   EGD with ED.  Indications:  Patient is 63 year-old Caucasian male with chronic GERD who presents with one year history of solid food dysphagia now occurring 3-4 times a week. He is taking OTC Nexium 2 capsules daily for control of his heartburn.           Informed Consent:  The risks, benefits, alternatives & imponderables which include, but are not limited to, bleeding, infection, perforation, drug reaction and potential missed lesion have been reviewed.  The potential for biopsy, lesion removal, esophageal dilation, etc. have also been discussed.  Questions have been answered.  All parties agreeable.  Please see history & physical in medical record for more information.  Medications:  Cetacaine spray topically for oropharyngeal anesthesia Monitored anesthesia care. Please see anesthesia records for details.  Description of procedure:  The endoscope was introduced through the mouth and advanced to the second portion of the duodenum without difficulty or limitations. The mucosal surfaces were surveyed very carefully during advancement of the scope and upon withdrawal.  Findings:  Esophagus:  Mucosa of the esophagus appeared normal. GE junction unremarkable without ring or stricture formation. GEJ:  39 cm Hiatus:  41 cm Stomach:  Stomach was empty and distended very well with insufflation. Folds in the proximal stomach were normal. Mucosa at gastric body, antrum, pyloric channel, angularis, fundus and cardia was normal. Duodenum:  Normal bulbar and post bulbar mucosa.  Therapeutic/Diagnostic Maneuvers Performed:   Esophagus was initially dilated by passing 52 [place] Maloney dilator to full insertion. Endoscope was passed again and no mucosal disruption noted. Esophagus  was then dilated by passing 54 and 56 [place] Maloney dilators full insertion. Esophageal mucosa was reexamined and small linear mucosal disruption noted at cervical esophagus indicative of a web. She was taken from its fascial mucosa at body for routine histology and endoscope was withdrawn.  Complications:  None  Impression: Small sliding hiatal hernia otherwise normal EGD. 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 Biopsy taken from esophageal mucosa for routine histology.  Recommendations:  Continue anti-reflux measures and OTC Nexium as before. I will be contacting patient with biopsy results.  Rogene Houston  06/10/2013  8:03 AM  CC: Dr. Collene Mares, Marland Kitchen, PA-C & Dr. Rayne Du ref. provider found

## 2013-06-10 NOTE — Discharge Instructions (Signed)
Resume usual medications and diet. No driving for 24 hours. Physician will call with biopsy results.  Gastrointestinal Endoscopy Care After Refer to this sheet in the next few weeks. These instructions provide you with information on caring for yourself after your procedure. Your caregiver may also give you more specific instructions. Your treatment has been planned according to current medical practices, but problems sometimes occur. Call your caregiver if you have any problems or questions after your procedure. HOME CARE INSTRUCTIONS  If you were given medicine to help you relax (sedative), do not drive, operate machinery, or sign important documents for 24 hours.  Avoid alcohol and hot or warm beverages for the first 24 hours after the procedure.  Only take over-the-counter or prescription medicines for pain, discomfort, or fever as directed by your caregiver. You may resume taking your normal medicines unless your caregiver tells you otherwise. Ask your caregiver when you may resume taking medicines that may cause bleeding, such as aspirin, clopidogrel, or warfarin.  You may return to your normal diet and activities on the day after your procedure, or as directed by your caregiver. Walking may help to reduce any bloated feeling in your abdomen.  Drink enough fluids to keep your urine clear or pale yellow.  You may gargle with salt water if you have a sore throat. SEEK IMMEDIATE MEDICAL CARE IF:  You have severe nausea or vomiting.  You have severe abdominal pain, abdominal cramps that last longer than 6 hours, or abdominal swelling (distention).  You have severe shoulder or back pain.  You have trouble swallowing.  You have shortness of breath, your breathing is shallow, or you are breathing faster than normal.  You have a fever or a rapid heartbeat.  You vomit blood or material that looks like coffee grounds.  You have bloody, black, or tarry stools. MAKE SURE  YOU:  Understand these instructions.  Will watch your condition.  Will get help right away if you are not doing well or get worse.

## 2013-06-11 ENCOUNTER — Encounter (HOSPITAL_COMMUNITY): Payer: Self-pay | Admitting: Internal Medicine

## 2013-06-12 ENCOUNTER — Encounter (INDEPENDENT_AMBULATORY_CARE_PROVIDER_SITE_OTHER): Payer: Self-pay | Admitting: *Deleted

## 2013-06-23 ENCOUNTER — Other Ambulatory Visit (HOSPITAL_COMMUNITY): Payer: Self-pay | Admitting: Family Medicine

## 2013-06-23 ENCOUNTER — Ambulatory Visit (HOSPITAL_COMMUNITY)
Admission: RE | Admit: 2013-06-23 | Discharge: 2013-06-23 | Disposition: A | Discharge status: Home / Self Care | Payer: Medicaid Other | Source: Ambulatory Visit | Attending: Family Medicine | Admitting: Family Medicine

## 2013-06-23 DIAGNOSIS — R079 Chest pain, unspecified: Secondary | ICD-10-CM | POA: Insufficient documentation

## 2013-06-23 DIAGNOSIS — I714 Abdominal aortic aneurysm, without rupture, unspecified: Secondary | ICD-10-CM

## 2013-06-23 DIAGNOSIS — Z87891 Personal history of nicotine dependence: Secondary | ICD-10-CM | POA: Insufficient documentation

## 2013-06-23 DIAGNOSIS — R059 Cough, unspecified: Secondary | ICD-10-CM | POA: Insufficient documentation

## 2013-06-23 DIAGNOSIS — R05 Cough: Secondary | ICD-10-CM | POA: Insufficient documentation

## 2013-08-19 ENCOUNTER — Encounter: Payer: Self-pay | Admitting: Gastroenterology

## 2013-10-21 ENCOUNTER — Encounter: Payer: Self-pay | Admitting: Gastroenterology

## 2013-12-10 ENCOUNTER — Encounter: Payer: Self-pay | Admitting: Family

## 2013-12-11 ENCOUNTER — Ambulatory Visit (INDEPENDENT_AMBULATORY_CARE_PROVIDER_SITE_OTHER): Payer: Medicaid Other | Admitting: Family

## 2013-12-11 ENCOUNTER — Encounter: Payer: Self-pay | Admitting: Family

## 2013-12-11 ENCOUNTER — Ambulatory Visit (HOSPITAL_COMMUNITY)
Admission: RE | Admit: 2013-12-11 | Discharge: 2013-12-11 | Disposition: A | Discharge status: Home / Self Care | Payer: Medicaid Other | Source: Ambulatory Visit | Attending: Family | Admitting: Family

## 2013-12-11 VITALS — BP 96/71 | HR 60 | Resp 14 | Ht 68.5 in | Wt 172.0 lb

## 2013-12-11 DIAGNOSIS — Z9889 Other specified postprocedural states: Secondary | ICD-10-CM

## 2013-12-11 DIAGNOSIS — I714 Abdominal aortic aneurysm, without rupture, unspecified: Secondary | ICD-10-CM

## 2013-12-11 DIAGNOSIS — Z95828 Presence of other vascular implants and grafts: Principal | ICD-10-CM

## 2013-12-11 DIAGNOSIS — Z48812 Encounter for surgical aftercare following surgery on the circulatory system: Secondary | ICD-10-CM

## 2013-12-11 DIAGNOSIS — Z8679 Personal history of other diseases of the circulatory system: Secondary | ICD-10-CM

## 2013-12-11 NOTE — Patient Instructions (Signed)
Abdominal Aortic Aneurysm An aneurysm is a weakened or damaged part of an artery wall that bulges from the normal force of blood pumping through the body. An abdominal aortic aneurysm is an aneurysm that occurs in the lower part of the aorta, the main artery of the body.  The major concern with an abdominal aortic aneurysm is that it can enlarge and burst (rupture) or blood can flow between the layers of the wall of the aorta through a tear (aorticdissection). Both of these conditions can cause bleeding inside the body and can be life threatening unless diagnosed and treated promptly. CAUSES  The exact cause of an abdominal aortic aneurysm is unknown. Some contributing factors are:   A hardening of the arteries caused by the buildup of fat and other substances in the lining of a blood vessel (arteriosclerosis).  Inflammation of the walls of an artery (arteritis).   Connective tissue diseases, such as Marfan syndrome.   Abdominal trauma.   An infection, such as syphilis or staphylococcus, in the wall of the aorta (infectious aortitis) caused by bacteria. RISK FACTORS  Risk factors that contribute to an abdominal aortic aneurysm may include:  Age older than 60 years.   High blood pressure (hypertension).  Male gender.  Ethnicity (white race).  Obesity.  Family history of aneurysm (first degree relatives only).  Tobacco use. PREVENTION  The following healthy lifestyle habits may help decrease your risk of abdominal aortic aneurysm:  Quitting smoking. Smoking can raise your blood pressure and cause arteriosclerosis.  Limiting or avoiding alcohol.  Keeping your blood pressure, blood sugar level, and cholesterol levels within normal limits.  Decreasing your salt intake. In somepeople, too much salt can raise blood pressure and increase your risk of abdominal aortic aneurysm.  Eating a diet low in saturated fats and cholesterol.  Increasing your fiber intake by including  whole grains, vegetables, and fruits in your diet. Eating these foods may help lower blood pressure.  Maintaining a healthy weight.  Staying physically active and exercising regularly. SYMPTOMS  The symptoms of abdominal aortic aneurysm may vary depending on the size and rate of growth of the aneurysm.Most grow slowly and do not have any symptoms. When symptoms do occur, they may include:  Pain (abdomen, side, lower back, or groin). The pain may vary in intensity. A sudden onset of severe pain may indicate that the aneurysm has ruptured.  Feeling full after eating only small amounts of food.  Nausea or vomiting or both.  Feeling a pulsating lump in the abdomen.  Feeling faint or passing out. DIAGNOSIS  Since most unruptured abdominal aortic aneurysms have no symptoms, they are often discovered during diagnostic exams for other conditions. An aneurysm may be found during the following procedures:  Ultrasonography (A one-time screening for abdominal aortic aneurysm by ultrasonography is also recommended for all men aged 65-75 years who have ever smoked).  X-ray exams.  A computed tomography (CT).  Magnetic resonance imaging (MRI).  Angiography or arteriography. TREATMENT  Treatment of an abdominal aortic aneurysm depends on the size of your aneurysm, your age, and risk factors for rupture. Medication to control blood pressure and pain may be used to manage aneurysms smaller than 6 cm. Regular monitoring for enlargement may be recommended by your caregiver if:  The aneurysm is 3-4 cm in size (an annual ultrasonography may be recommended).  The aneurysm is 4-4.5 cm in size (an ultrasonography every 6 months may be recommended).  The aneurysm is larger than 4.5 cm in   size (your caregiver may ask that you be examined by a vascular surgeon). If your aneurysm is larger than 6 cm, surgical repair may be recommended. There are two main methods for repair of an aneurysm:   Endovascular  repair (a minimally invasive surgery). This is done most often.  Open repair. This method is used if an endovascular repair is not possible. Document Released: 10/18/2004 Document Revised: 05/05/2012 Document Reviewed: 02/08/2012 ExitCare Patient Information 2015 [employer]. This information is not intended to replace advice given to you by your health care provider. Make sure you discuss any questions you have with your health care provider.   Smoking Cessation Quitting smoking is important to your health and has many advantages. However, it is not always easy to quit since nicotine is a very addictive drug. Oftentimes, people try 3 times or more before being able to quit. This document explains the best ways for you to prepare to quit smoking. Quitting takes hard work and a lot of effort, but you can do it. ADVANTAGES OF QUITTING SMOKING  You will live longer, feel better, and live better.  Your body will feel the impact of quitting smoking almost immediately.  Within 20 minutes, blood pressure decreases. Your pulse returns to its normal level.  After 8 hours, carbon monoxide levels in the blood return to normal. Your oxygen level increases.  After 24 hours, the chance of having a heart attack starts to decrease. Your breath, hair, and body stop smelling like smoke.  After 48 hours, damaged nerve endings begin to recover. Your sense of taste and smell improve.  After 72 hours, the body is virtually free of nicotine. Your bronchial tubes relax and breathing becomes easier.  After 2 to 12 weeks, lungs can hold more air. Exercise becomes easier and circulation improves.  The risk of having a heart attack, stroke, cancer, or lung disease is greatly reduced.  After 1 year, the risk of coronary heart disease is cut in half.  After 5 years, the risk of stroke falls to the same as a nonsmoker.  After 10 years, the risk of lung cancer is cut in half and the risk of other cancers  decreases significantly.  After 15 years, the risk of coronary heart disease drops, usually to the level of a nonsmoker.  If you are pregnant, quitting smoking will improve your chances of having a healthy baby.  The people you live with, especially any children, will be healthier.  You will have extra money to spend on things other than cigarettes. QUESTIONS TO THINK ABOUT BEFORE ATTEMPTING TO QUIT You may want to talk about your answers with your health care provider.  Why do you want to quit?  If you tried to quit in the past, what helped and what did not?  What will be the most difficult situations for you after you quit? How will you plan to handle them?  Who can help you through the tough times? Your family? Friends? A health care provider?  What pleasures do you get from smoking? What ways can you still get pleasure if you quit? Here are some questions to ask your health care provider:  How can you help me to be successful at quitting?  What medicine do you think would be best for me and how should I take it?  What should I do if I need more help?  What is smoking withdrawal like? How can I get information on withdrawal? GET READY  Set a quit   date.  Change your environment by getting rid of all cigarettes, ashtrays, matches, and lighters in your home, car, or work. Do not let people smoke in your home.  Review your past attempts to quit. Think about what worked and what did not. GET SUPPORT AND ENCOURAGEMENT You have a better chance of being successful if you have help. You can get support in many ways.  Tell your family, friends, and coworkers that you are going to quit and need their support. Ask them not to smoke around you.  Get individual, group, or telephone counseling and support. Programs are available at local hospitals and health centers. Call your local health department for information about programs in your area.  Spiritual beliefs and practices may  help some smokers quit.  Download a "quit meter" on your computer to keep track of quit statistics, such as how long you have gone without smoking, cigarettes not smoked, and money saved.  Get a self-help book about quitting smoking and staying off tobacco. LEARN NEW SKILLS AND BEHAVIORS  Distract yourself from urges to smoke. Talk to someone, go for a walk, or occupy your time with a task.  Change your normal routine. Take a different route to work. Drink tea instead of coffee. Eat breakfast in a different place.  Reduce your stress. Take a hot bath, exercise, or read a book.  Plan something enjoyable to do every day. Reward yourself for not smoking.  Explore interactive web-based programs that specialize in helping you quit. GET MEDICINE AND USE IT CORRECTLY Medicines can help you stop smoking and decrease the urge to smoke. Combining medicine with the above behavioral methods and support can greatly increase your chances of successfully quitting smoking.  Nicotine replacement therapy helps deliver nicotine to your body without the negative effects and risks of smoking. Nicotine replacement therapy includes nicotine gum, lozenges, inhalers, nasal sprays, and skin patches. Some may be available over-the-counter and others require a prescription.  Antidepressant medicine helps people abstain from smoking, but how this works is unknown. This medicine is available by prescription.  Nicotinic receptor partial agonist medicine simulates the effect of nicotine in your brain. This medicine is available by prescription. Ask your health care provider for advice about which medicines to use and how to use them based on your health history. Your health care provider will tell you what side effects to look out for if you choose to be on a medicine or therapy. Carefully read the information on the package. Do not use any other product containing nicotine while using a nicotine replacement product.    RELAPSE OR DIFFICULT SITUATIONS Most relapses occur within the first 3 months after quitting. Do not be discouraged if you start smoking again. Remember, most people try several times before finally quitting. You may have symptoms of withdrawal because your body is used to nicotine. You may crave cigarettes, be irritable, feel very hungry, cough often, get headaches, or have difficulty concentrating. The withdrawal symptoms are only temporary. They are strongest when you first quit, but they will go away within 10-14 days. To reduce the chances of relapse, try to:  Avoid drinking alcohol. Drinking lowers your chances of successfully quitting.  Reduce the amount of caffeine you consume. Once you quit smoking, the amount of caffeine in your body increases and can give you symptoms, such as a rapid heartbeat, sweating, and anxiety.  Avoid smokers because they can make you want to smoke.  Do not let weight gain distract you. Many   smokers will gain weight when they quit, usually less than 10 pounds. Eat a healthy diet and stay active. You can always lose the weight gained after you quit.  Find ways to improve your mood other than smoking. FOR MORE INFORMATION  www.smokefree.gov  Document Released: 01/02/2001 Document Revised: 05/25/2013 Document Reviewed: 04/19/2011 ExitCare Patient Information 2015 [employer]. This information is not intended to replace advice given to you by your health care provider. Make sure you discuss any questions you have with your health care provider.   Smoking Cessation, Tips for Success If you are ready to quit smoking, congratulations! You have chosen to help yourself be healthier. Cigarettes bring nicotine, tar, carbon monoxide, and other irritants into your body. Your lungs, heart, and blood vessels will be able to work better without these poisons. There are many different ways to quit smoking. Nicotine gum, nicotine patches, a nicotine inhaler, or nicotine  nasal spray can help with physical craving. Hypnosis, support groups, and medicines help break the habit of smoking. WHAT THINGS CAN I DO TO MAKE QUITTING EASIER?  Here are some tips to help you quit for good:  Pick a date when you will quit smoking completely. Tell all of your friends and family about your plan to quit on that date.  Do not try to slowly cut down on the number of cigarettes you are smoking. Pick a quit date and quit smoking completely starting on that day.  Throw away all cigarettes.   Clean and remove all ashtrays from your home, work, and car.  On a card, write down your reasons for quitting. Carry the card with you and read it when you get the urge to smoke.  Cleanse your body of nicotine. Drink enough water and fluids to keep your urine clear or pale yellow. Do this after quitting to flush the nicotine from your body.  Learn to predict your moods. Do not let a bad situation be your excuse to have a cigarette. Some situations in your life might tempt you into wanting a cigarette.  Never have "just one" cigarette. It leads to wanting another and another. Remind yourself of your decision to quit.  Change habits associated with smoking. If you smoked while driving or when feeling stressed, try other activities to replace smoking. Stand up when drinking your coffee. Brush your teeth after eating. Sit in a different chair when you read the paper. Avoid alcohol while trying to quit, and try to drink fewer caffeinated beverages. Alcohol and caffeine may urge you to smoke.  Avoid foods and drinks that can trigger a desire to smoke, such as sugary or spicy foods and alcohol.  Ask people who smoke not to smoke around you.  Have something planned to do right after eating or having a cup of coffee. For example, plan to take a walk or exercise.  Try a relaxation exercise to calm you down and decrease your stress. Remember, you may be tense and nervous for the first 2 weeks after  you quit, but this will pass.  Find new activities to keep your hands busy. Play with a pen, coin, or rubber band. Doodle or draw things on paper.  Brush your teeth right after eating. This will help cut down on the craving for the taste of tobacco after meals. You can also try mouthwash.   Use oral substitutes in place of cigarettes. Try using lemon drops, carrots, cinnamon sticks, or chewing gum. Keep them handy so they are available when you   have the urge to smoke.  When you have the urge to smoke, try deep breathing.  Designate your home as a nonsmoking area.  If you are a heavy smoker, ask your health care provider about a prescription for nicotine chewing gum. It can ease your withdrawal from nicotine.  Reward yourself. Set aside the cigarette money you save and buy yourself something nice.  Look for support from others. Join a support group or smoking cessation program. Ask someone at home or at work to help you with your plan to quit smoking.  Always ask yourself, "Do I need this cigarette or is this just a reflex?" Tell yourself, "Today, I choose not to smoke," or "I do not want to smoke." You are reminding yourself of your decision to quit.  Do not replace cigarette smoking with electronic cigarettes (commonly called e-cigarettes). The safety of e-cigarettes is unknown, and some may contain harmful chemicals.  If you relapse, do not give up! Plan ahead and think about what you will do the next time you get the urge to smoke. HOW WILL I FEEL WHEN I QUIT SMOKING? You may have symptoms of withdrawal because your body is used to nicotine (the addictive substance in cigarettes). You may crave cigarettes, be irritable, feel very hungry, cough often, get headaches, or have difficulty concentrating. The withdrawal symptoms are only temporary. They are strongest when you first quit but will go away within 10-14 days. When withdrawal symptoms occur, stay in control. Think about your reasons  for quitting. Remind yourself that these are signs that your body is healing and getting used to being without cigarettes. Remember that withdrawal symptoms are easier to treat than the major diseases that smoking can cause.  Even after the withdrawal is over, expect periodic urges to smoke. However, these cravings are generally short lived and will go away whether you smoke or not. Do not smoke! WHAT RESOURCES ARE AVAILABLE TO HELP ME QUIT SMOKING? Your health care provider can direct you to community resources or hospitals for support, which may include:  Group support.  Education.  Hypnosis.  Therapy. Document Released: 10/07/2003 Document Revised: 05/25/2013 Document Reviewed: 06/26/2012 ExitCare Patient Information 2015 [employer]. This information is not intended to replace advice given to you by your health care provider. Make sure you discuss any questions you have with your health care provider.   

## 2013-12-11 NOTE — Progress Notes (Signed)
[facility]  Established EVAR  History of Present Illness  [name] is a 63 y.o. ([birth year]) male patient of Dr. Bridgett Larsson who presents for routine follow up s/p EVAR (Date: 06/21/10). Most recent EVAR duplex (Date: 05/30/12) demonstrates: no endoleak and shrinking sac size. Most recent CTA (Date: 12/05/2012) demonstrates: no endoleak and no sac. The patient has had chronic back pain now with some radiculopathy. He denies any new back pain, denies abdominal pain other than from gas that he has had for 6-8 months, this is relieved by passing gas. He states he also has a hiatal hernia.  The patient denies any history of stroke or TIA, denies history of MI; he states he had a heart murmur as a child.  Pt Diabetic: No Pt smoker: smoker  (1 ppd, started at age 47 yrs)   Past Medical History  Diagnosis Date  . Back pain   . Essential hypertension, benign   . Degenerative disk disease   . Hyperlipidemia   . AAA (abdominal aortic aneurysm)   . GERD (gastroesophageal reflux disease)   . COPD (chronic obstructive pulmonary disease)   . Cancer     skin cancer  . 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 Appendectomy    . Spinal fusion      of neck and lower back  . Evar  06/20/10    for AAA repair   . Back surgery    . Esophagogastroduodenoscopy (egd) with propofol N/A 06/10/2013    Procedure: ESOPHAGOGASTRODUODENOSCOPY (EGD) WITH PROPOFOL;  Surgeon: Rogene Houston, MD;  Location: [facility];  Service: Endoscopy;  Laterality: N/A;  Venia Minks dilation N/A 06/10/2013    Procedure: MALONEY DILATION 52 fr, 70 fr;  Surgeon: Rogene Houston, MD;  Location: [facility];  Service: Endoscopy;  Laterality: N/A;  . Esophageal biopsy N/A 06/10/2013    Procedure: BIOPSY;  Surgeon: Rogene Houston, MD;  Location: [facility];  Service: Endoscopy;  Laterality: N/A;   Social History History  Substance Use  Topics  . Smoking status: Current Every Day Smoker -- 1.00 packs/day for 25 years    Types: Cigarettes  . Smokeless tobacco: Never Used     Comment: 1 pack a day x 1 30 yrs  . Alcohol Use: No   Family History Family History  Problem Relation Age of Onset  . Atrial fibrillation Mother   . Heart disease Mother     After age 40  . Deep vein thrombosis Mother   . Hyperlipidemia Mother   . Hypertension Mother   . Stroke Father   . Diabetes Father   . Heart disease Father     Before age 41  . Kidney disease Father   . Hyperlipidemia Father   . Hypertension Father   . Heart attack Father   . Heart disease Sister   . Diabetes Sister   . Heart disease Sister    Current Outpatient Prescriptions on File Prior to Visit  Medication Sig Dispense Refill  . aspirin 81 MG tablet Take 81 mg by mouth daily.     . diazepam (VALIUM) 5 MG tablet Take 5 mg by mouth every 6 (six) hours as needed. For anxiety    . esomeprazole (NEXIUM) 20 MG capsule Take 20 mg by mouth daily at 12 noon.    . gabapentin (NEURONTIN) 400 MG capsule Take 400 mg by  mouth 3 (three) times daily.    Marland Kitchen losartan-hydrochlorothiazide (HYZAAR) 100-12.5 MG per tablet Take 0.5 tablets by mouth daily.    Marland Kitchen oxycodone (OXY-IR) 5 MG capsule Take 20 mg by mouth every 4 (four) hours as needed for pain.     Marland Kitchen albuterol (PROVENTIL) (2.5 MG/3ML) 0.083% nebulizer solution Take 2.5 mg by nebulization every 6 (six) hours as needed. Shortness of breath     No current facility-administered medications on file prior to visit.   Allergies  Allergen Reactions  . Benazepril Hcl Other (See Comments)    Can't breathe  . Pantoprazole Sodium Other (See Comments)    Can't breathe     ROS: See HPI for pertinent positives and negatives.  Physical Examination  Filed Vitals:   12/11/13 0921  BP: 96/71  Pulse: 60  Resp: 14  Height: 5' 8.5" (1.74 m)  Weight: 172 lb (78.019 kg)  SpO2: 96%   Body mass index is 25.77 kg/(m^2).  General: A&O  x 3, WDWN  Pulmonary: Sym exp, good air movt, CTAB, no rales or rhonchi, transient  wheezing  Cardiac: RRR, Nl S1, S2, no Murmurs, rubs or gallops  Vascular: Vessel Right Left  Radial Palpable Palpable  Brachial Palpable Palpable  Carotid Palpable, without bruit Palpable, without bruit  Aorta Not palpable N/A  Femoral Palpable Palpable  Popliteal Not palpable Not palpable  PT Palpable Palpable  DP Palpable Palpable   Gastrointestinal: soft, NTND, -G/R, - HSM, - masses palpated, - CVAT B, no palpable AAA  Musculoskeletal: M/S 5/5 throughout , Extremities without ischemic changes , no palpable popliteal aneurysms  Neurologic: Pain and light touch intact in extremities , Motor exam as listed above    Non-Invasive Vascular Imaging  EVAR Duplex (Date: 12/11/2013) ABDOMINAL AORTA DUPLEX EVALUATION - POST ENDOVASCULAR REPAIR    INDICATION: Follow-up endovascular aortic repair     PREVIOUS INTERVENTION(S): Endovascular aortic repair of abdominal aortic aneurysm 06/20/2010    DUPLEX EXAM:      DIAMETER AP (cm) DIAMETER TRANSVERSE (cm) VELOCITIES (cm/sec)  Aorta 3.02  87  Right Common Iliac 1.35  98  Left Common Iliac   100    Comparison Study  CT     Date DIAMETER AP (cm) DIAMETER TRANSVERSE (cm)  12/05/2012 NA- resolved      ADDITIONAL FINDINGS: Bilateral common iliac artery waveforms are triphasic.    IMPRESSION: Widely patent stent graft repair. Residual aneurysmal sac appears to have completely resolved and is not visualized on today's exam.    Compared to the previous exam:  No significant change compared to prior exam.       CTA abd/pelvis (12/05/2012)  Based on Dr. Lianne Moris review of this patient's CTA, his endograft is in appropriate immediate infrarenal position with widely patent renal arteries and SMA and CA. There is no evidence of limb dysfunction. The AAA sac is nearly non-existent.    Medical Decision Making  [name] is a 63 y.o. male who is s/p EVAR (Date: 06/21/10).  Pt is asymptomatic. Widely patent stent graft repair. Residual aneurysmal sac appears to have completely resolved and is not visualized on today's exam. No significant change compared to prior exam.   The patient was counseled re smoking cessation and given several free resources re smoking cessation.   I discussed with the patient the importance of surveillance of the endograft.  The next endograft duplex will be scheduled for 12 months.  Given the near resolution of AAA sac in this patient, Dr.  Bridgett Larsson doubts any benefit to repeat CTA, despite the late endoleak findings in the OVER trial.  The patient will follow up with [place] in 12 months with these studies.  I emphasized the importance of maximal medical management including strict control of blood pressure, blood glucose, and lipid levels, antiplatelet agents, obtaining regular exercise, and cessation of smoking.   The patient was given information about AAA including signs, symptoms, treatment, and how to minimize the risk of enlargement and rupture of aneurysms.    Thank you for allowing [place] to participate in this patient's care.  Clemon Chambers, RN, MSN, FNP-C [facility] Office: [phone]  Clinic Physician: Bridgett Larsson  12/11/2013, 9:19 AM

## 2013-12-11 NOTE — Addendum Note (Signed)
Addended by: Mena Goes on: 12/11/2013 02:22 PM   Modules accepted: Orders

## 2014-01-07 ENCOUNTER — Encounter (INDEPENDENT_AMBULATORY_CARE_PROVIDER_SITE_OTHER): Payer: Self-pay

## 2014-02-26 IMAGING — CR DG LUMBAR SPINE COMPLETE 4+V
5 series · 5 of 5 positions shown · non-contrast
Comparison: MRI of the lumbar spine 05/16/2010.

CLINICAL DATA: Left groin pain.  Right leg numbness.

LUMBAR SPINE - COMPLETE 4+ VIEW

[view not recorded (1 of 5)]
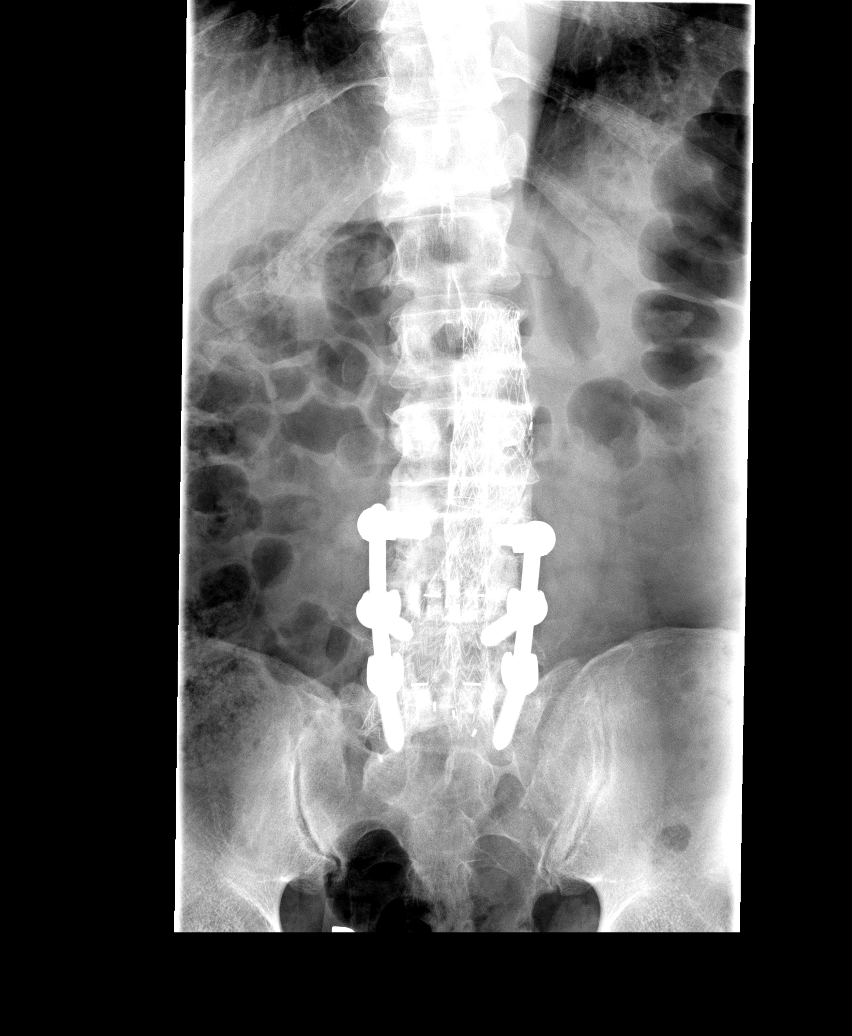

[view not recorded (2 of 5)]
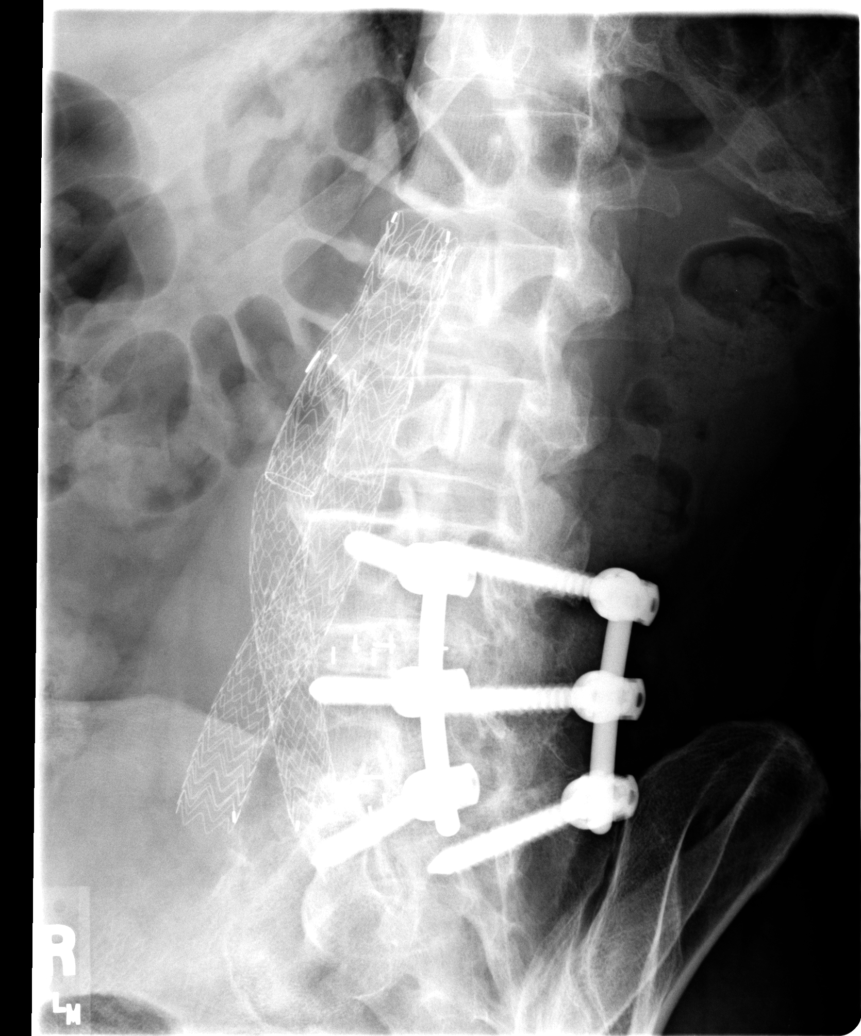

[view not recorded (3 of 5)]
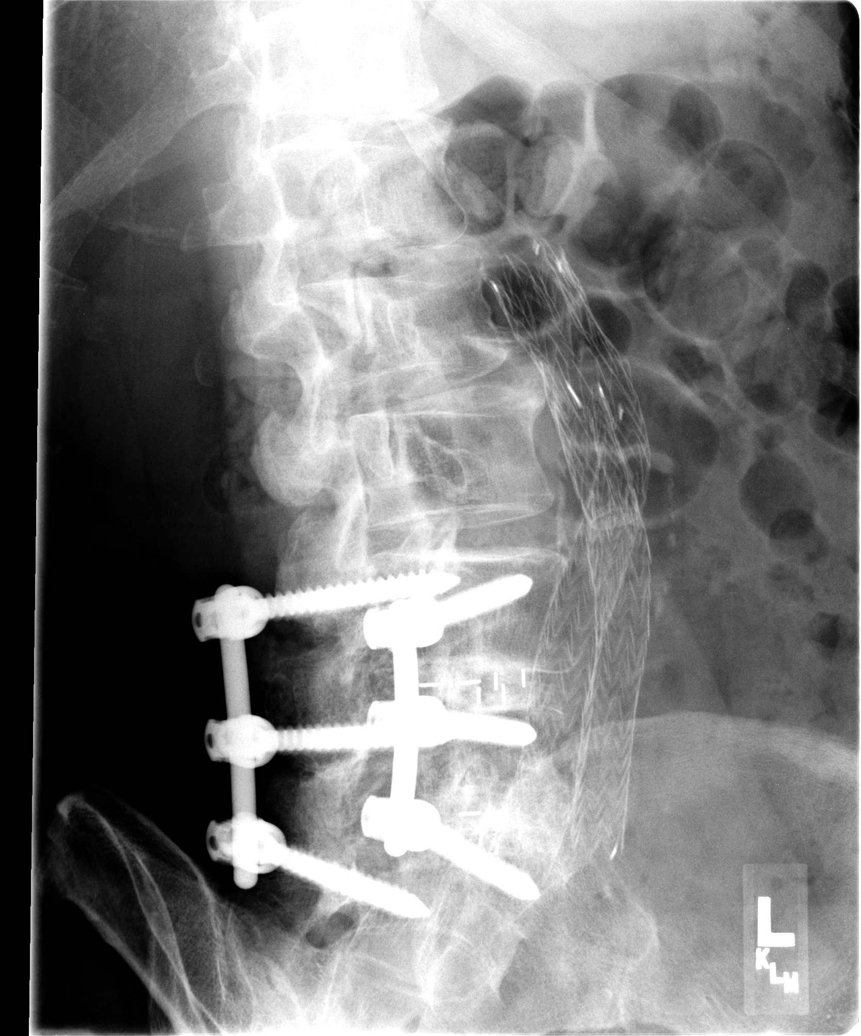

[view not recorded (4 of 5)]
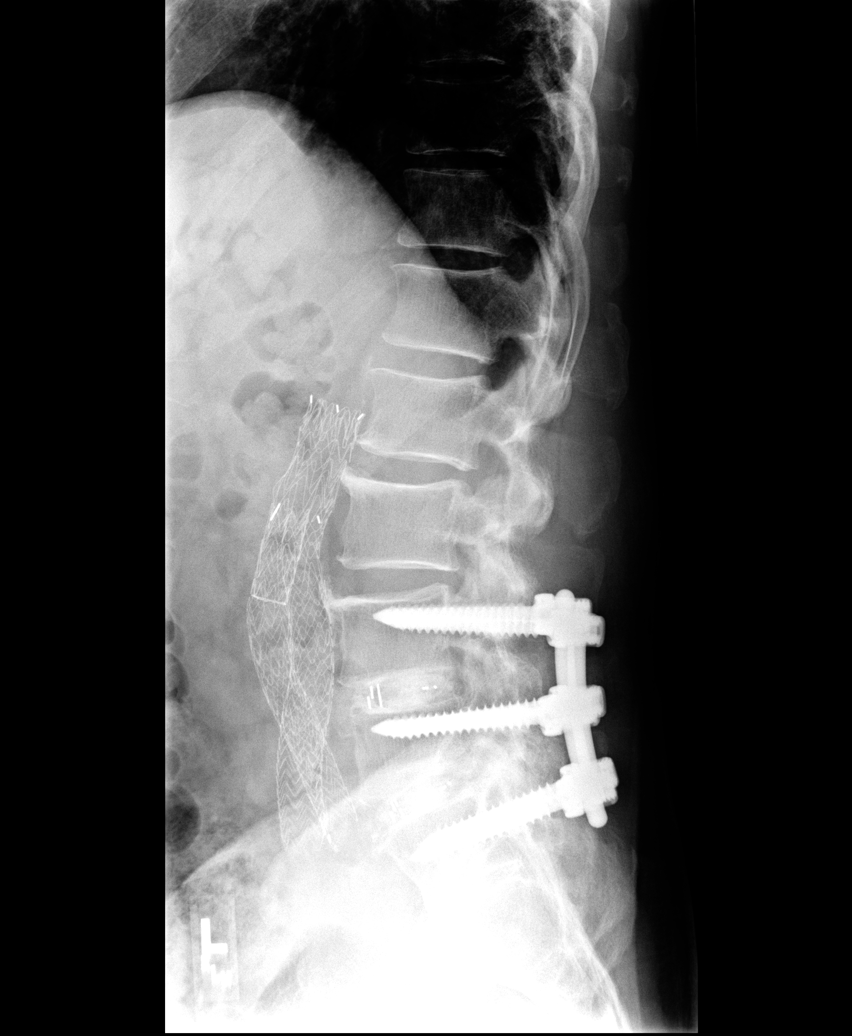

[view not recorded (5 of 5)]
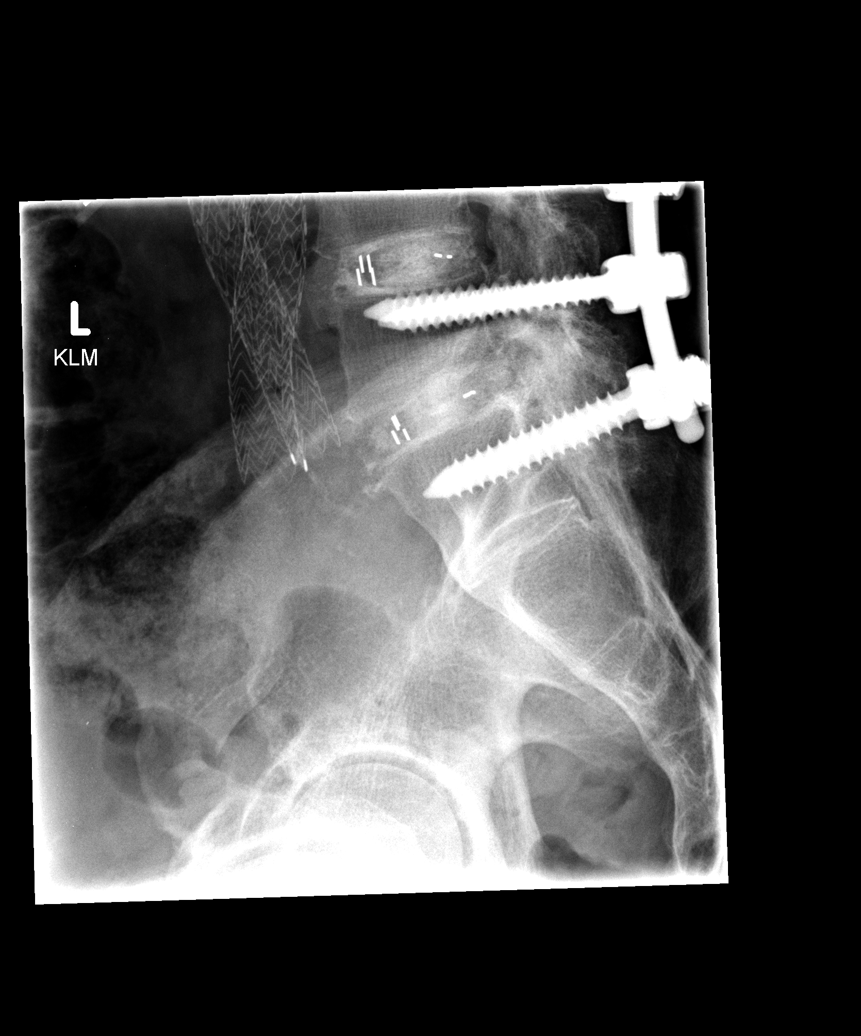

[5 of 5 positions shown; findings below may reference images not displayed]

FINDINGS: The patient is status post lumbar fusion at L4-5 and L5-
S1.  Slight retrolisthesis at L[DATE] have progressed.  Vertebral
body heights and alignment maintained.  The hardware is intact.  An
aortobi-iliac stent graft is in place.  Degenerative changes are
noted at the SI joints bilaterally.
IMPRESSION: 1.  Status post L4-5 and L5-S1 fusion.
2.  Slight retrolisthesis at L3-4 is slightly more prominent than
on the prior exam.
3.  Stable appearance of the stent graft.
4.  Degenerative changes at the SI joints bilaterally.

## 2014-06-01 ENCOUNTER — Encounter: Payer: Self-pay | Admitting: Gastroenterology

## 2014-07-22 ENCOUNTER — Other Ambulatory Visit (HOSPITAL_COMMUNITY): Payer: Self-pay | Admitting: Physician Assistant

## 2014-07-22 DIAGNOSIS — K439 Ventral hernia without obstruction or gangrene: Secondary | ICD-10-CM

## 2014-07-27 ENCOUNTER — Ambulatory Visit (HOSPITAL_COMMUNITY)
Admission: RE | Admit: 2014-07-27 | Discharge: 2014-07-27 | Disposition: A | Discharge status: Home / Self Care | Payer: Medicaid Other | Source: Ambulatory Visit | Attending: Physician Assistant | Admitting: Physician Assistant

## 2014-07-27 DIAGNOSIS — K439 Ventral hernia without obstruction or gangrene: Secondary | ICD-10-CM | POA: Diagnosis present

## 2014-10-13 ENCOUNTER — Emergency Department (HOSPITAL_COMMUNITY)
Admission: EM | Admit: 2014-10-13 | Discharge: 2014-10-14 | Disposition: A | Discharge status: Home / Self Care | Payer: Medicaid Other | Attending: Emergency Medicine | Admitting: Emergency Medicine

## 2014-10-13 ENCOUNTER — Emergency Department (HOSPITAL_COMMUNITY): Payer: Medicaid Other

## 2014-10-13 ENCOUNTER — Encounter (HOSPITAL_COMMUNITY): Payer: Self-pay | Admitting: Emergency Medicine

## 2014-10-13 DIAGNOSIS — E785 Hyperlipidemia, unspecified: Secondary | ICD-10-CM | POA: Diagnosis not present

## 2014-10-13 DIAGNOSIS — R0600 Dyspnea, unspecified: Secondary | ICD-10-CM

## 2014-10-13 DIAGNOSIS — K219 Gastro-esophageal reflux disease without esophagitis: Secondary | ICD-10-CM | POA: Diagnosis not present

## 2014-10-13 DIAGNOSIS — J441 Chronic obstructive pulmonary disease with (acute) exacerbation: Secondary | ICD-10-CM | POA: Insufficient documentation

## 2014-10-13 DIAGNOSIS — Z79899 Other long term (current) drug therapy: Secondary | ICD-10-CM | POA: Insufficient documentation

## 2014-10-13 DIAGNOSIS — Z85828 Personal history of other malignant neoplasm of skin: Secondary | ICD-10-CM | POA: Insufficient documentation

## 2014-10-13 DIAGNOSIS — Z720 Tobacco use: Secondary | ICD-10-CM | POA: Diagnosis not present

## 2014-10-13 DIAGNOSIS — I10 Essential (primary) hypertension: Secondary | ICD-10-CM | POA: Diagnosis not present

## 2014-10-13 DIAGNOSIS — Z7982 Long term (current) use of aspirin: Secondary | ICD-10-CM | POA: Insufficient documentation

## 2014-10-13 DIAGNOSIS — R0602 Shortness of breath: Secondary | ICD-10-CM | POA: Diagnosis present

## 2014-10-13 LAB — BASIC METABOLIC PANEL
ANION GAP: 9 (ref 5–15)
BUN: 7 mg/dL (ref 6–20)
CO2: 27 mmol/L (ref 22–32)
CREATININE: 1.25 mg/dL — AB (ref 0.61–1.24)
Calcium: 8.8 mg/dL — ABNORMAL LOW (ref 8.9–10.3)
Chloride: 105 mmol/L (ref 101–111)
GFR calc non Af Amer: >60 mL/min (ref >60)
GLUCOSE: 104 mg/dL — AB (ref 65–99)
POTASSIUM: 2.9 mmol/L — AB (ref 3.5–5.1)
Sodium: 141 mmol/L (ref 135–145)

## 2014-10-13 LAB — CBC WITH DIFFERENTIAL/PLATELET
BASOS ABS: 0 10*3/uL (ref 0.0–0.1)
BASOS PCT: 0 %
EOS PCT: 4 %
Eosinophils Absolute: 0.5 10*3/uL (ref 0.0–0.7)
HEMATOCRIT: 41.4 % (ref 39.0–52.0)
Hemoglobin: 14.2 g/dL (ref 13.0–17.0)
Lymphocytes Relative: 20 %
Lymphs Abs: 2.3 10*3/uL (ref 0.7–4.0)
MCH: 30.6 pg (ref 26.0–34.0)
MCHC: 34.3 g/dL (ref 30.0–36.0)
MCV: 89.2 fL (ref 78.0–100.0)
MONO ABS: 1.2 10*3/uL — AB (ref 0.1–1.0)
Monocytes Relative: 10 %
NEUTROS ABS: 7.5 10*3/uL (ref 1.7–7.7)
Neutrophils Relative %: 66 %
PLATELETS: 229 10*3/uL (ref 150–400)
RBC: 4.64 MIL/uL (ref 4.22–5.81)
RDW: 13.3 % (ref 11.5–15.5)
WBC: 11.5 10*3/uL — AB (ref 4.0–10.5)

## 2014-10-13 LAB — I-STAT TROPONIN, ED: TROPONIN I, POC: 0 ng/mL (ref 0.00–0.08)

## 2014-10-13 LAB — D-DIMER, QUANTITATIVE: D-Dimer, Quant: 0.9 ug/mL-FEU — ABNORMAL HIGH (ref 0.00–0.48)

## 2014-10-13 MED ORDER — IOHEXOL 350 MG/ML SOLN
80.0000 mL | Freq: Once | INTRAVENOUS | Status: AC | PRN
  Administered 2014-10-13: 80 mL via INTRAVENOUS

## 2014-10-13 MED ORDER — ALBUTEROL SULFATE (2.5 MG/3ML) 0.083% IN NEBU
INHALATION_SOLUTION | RESPIRATORY_TRACT | Status: AC
  Filled 2014-10-13: qty 6

## 2014-10-13 MED ORDER — ALBUTEROL SULFATE (2.5 MG/3ML) 0.083% IN NEBU
5.0000 mg | INHALATION_SOLUTION | Freq: Once | RESPIRATORY_TRACT | Status: AC
  Administered 2014-10-13: 5 mg via RESPIRATORY_TRACT

## 2014-10-13 NOTE — ED Notes (Signed)
Pt. reports SOB this evening , productive cough , diaphoresis and elevated blood pressure . Denies chest pain , no nausea or fever .

## 2014-10-13 NOTE — ED Provider Notes (Signed)
CSN: [id number]     Arrival date & time 10/13/14  1858 History   First MD Initiated Contact with Patient 10/13/14 2121     Chief Complaint  Patient presents with  . Shortness of Breath     (Consider location/radiation/quality/duration/timing/severity/associated sxs/prior Treatment) Patient is a 64 y.o. male presenting with shortness of breath.  Shortness of Breath Severity:  Severe Onset quality:  Sudden Duration:  3 hours Timing:  Constant Progression:  Unchanged Chronicity:  New Context comment:  Spontaneous at rest Relieved by:  Nothing Worsened by:  Nothing tried Ineffective treatments:  None tried Associated symptoms: cough   Associated symptoms: no abdominal pain and no fever     Past Medical History  Diagnosis Date  . Back pain   . Essential hypertension, benign   . Degenerative disk disease   . Hyperlipidemia   . AAA (abdominal aortic aneurysm)   . GERD (gastroesophageal reflux disease)   . COPD (chronic obstructive pulmonary disease)   . Cancer     skin cancer  . 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 Heavy cigarette smoker    Past Surgical History  Procedure Laterality Date  . Appendectomy    . Spinal fusion      of neck and lower back  . Evar  06/20/10    for AAA repair   . Back surgery    . Esophagogastroduodenoscopy (egd) with propofol N/A 06/10/2013    Procedure: ESOPHAGOGASTRODUODENOSCOPY (EGD) WITH PROPOFOL;  Surgeon: Rogene Houston, MD;  Location: [facility];  Service: Endoscopy;  Laterality: N/A;  Venia Minks dilation N/A 06/10/2013    Procedure: MALONEY DILATION 52 fr, 23 fr;  Surgeon: Rogene Houston, MD;  Location: [facility];  Service: Endoscopy;  Laterality: N/A;  . Esophageal biopsy N/A 06/10/2013    Procedure: BIOPSY;  Surgeon: Rogene Houston, MD;  Location: [facility];  Service: Endoscopy;  Laterality: N/A;   Family History  Problem Relation Age of Onset  . Atrial fibrillation Mother   . Heart disease Mother     After  age 68  . Deep vein thrombosis Mother   . Hyperlipidemia Mother   . Hypertension Mother   . Stroke Father   . Diabetes Father   . Heart disease Father     Before age 61  . Kidney disease Father   . Hyperlipidemia Father   . Hypertension Father   . Heart attack Father   . Heart disease Sister   . Diabetes Sister   . Heart disease Sister    Social History  Substance Use Topics  . Smoking status: Current Every Day Smoker -- 0.00 packs/day for 0 years    Types: Cigarettes  . Smokeless tobacco: Never Used     Comment: 1 pack a day x 1 30 yrs  . Alcohol Use: No    Review of Systems  Constitutional: Negative for fever.  Respiratory: Positive for cough and shortness of breath.   Gastrointestinal: Negative for abdominal pain.  All other systems reviewed and are negative.     Allergies  Benazepril hcl and Pantoprazole sodium  Home Medications   Prior to Admission medications   Medication Sig Start Date End Date Taking? Authorizing Provider  albuterol (PROVENTIL) (2.5 MG/3ML) 0.083% nebulizer solution Take 2.5 mg by nebulization every 6 (six) hours as needed. Shortness of breath   Yes Historical Provider, MD  aspirin 81 MG tablet Take 81 mg by mouth daily.  Yes Historical Provider, MD  diazepam (VALIUM) 5 MG tablet Take 5 mg by mouth every 6 (six) hours as needed. For anxiety   Yes Historical Provider, MD  esomeprazole (NEXIUM) 20 MG capsule Take 20 mg by mouth daily at 12 noon.   Yes Historical Provider, MD  gabapentin (NEURONTIN) 400 MG capsule Take 400 mg by mouth daily as needed (for neuropathy).    Yes Historical Provider, MD  losartan-hydrochlorothiazide (HYZAAR) 100-12.5 MG per tablet Take 0.5 tablets by mouth daily.   Yes Historical Provider, MD  Oxycodone HCl 20 MG TABS Take 20 mg by mouth every 4 (four) hours.   Yes Historical Provider, MD  oxycodone (OXY-IR) 5 MG capsule Take 20 mg by mouth every 4 (four) hours as needed for pain.     Historical Provider, MD   predniSONE (DELTASONE) 20 MG tablet Take 3 tablets (60 mg total) by mouth daily. 10/14/14   Debby Freiberg, MD   BP 101/52 mmHg  Pulse 72  Temp(Src) 98.1 F (36.7 C) (Oral)  Resp 20  SpO2 94% Physical Exam  Constitutional: He is oriented to person, place, and time. He appears well-developed and well-nourished.  HENT:  Head: Normocephalic and atraumatic.  Eyes: Conjunctivae and EOM are normal.  Neck: Normal range of motion. Neck supple.  Cardiovascular: Normal rate, regular rhythm and normal heart sounds.   Pulmonary/Chest: Effort normal and breath sounds normal. No respiratory distress.  Abdominal: He exhibits no distension. There is no tenderness. There is no rebound and no guarding.  Musculoskeletal: Normal range of motion.  Neurological: He is alert and oriented to person, place, and time.  Skin: Skin is warm and dry.  Vitals reviewed.   ED Course  Procedures (including critical care time) Labs Review Labs Reviewed  CBC WITH DIFFERENTIAL/PLATELET - Abnormal; Notable for the following:    WBC 11.5 (*)    Monocytes Absolute 1.2 (*)    All other components within normal limits  BASIC METABOLIC PANEL - Abnormal; Notable for the following:    Potassium 2.9 (*)    Glucose, Bld 104 (*)    Creatinine, Ser 1.25 (*)    Calcium 8.8 (*)    All other components within normal limits  D-DIMER, QUANTITATIVE (NOT AT [facility]) - Abnormal; Notable for the following:    D-Dimer, Quant 0.90 (*)    All other components within normal limits  I-STAT TROPOININ, ED  Randolm Idol, ED    Imaging Review Dg Chest 2 View  10/13/2014   CLINICAL DATA:  Shortness of breath  EXAM: CHEST  2 VIEW  COMPARISON:  06/23/2013  FINDINGS: The heart size and mediastinal contours are within normal limits. Both lungs are clear. The visualized skeletal structures are unremarkable. Cervical fusion hardware partly visualized.  IMPRESSION: No active cardiopulmonary disease.   Electronically Signed   By: Conchita Paris M.D.   On: 10/13/2014 20:08   Ct Angio Chest Pe W/cm &/or Wo Cm  10/14/2014   CLINICAL DATA:  Shortness of breath this evening, sudden onset dyspnea, essential benign hypertension, COPD, smoker  EXAM: CT ANGIOGRAPHY CHEST WITH CONTRAST  TECHNIQUE: Multidetector CT imaging of the chest was performed using the standard protocol during bolus administration of intravenous contrast. Multiplanar CT image reconstructions and MIPs were obtained to evaluate the vascular anatomy.  CONTRAST:  17mL OMNIPAQUE IOHEXOL 350 MG/ML SOLN IV  COMPARISON:  None  FINDINGS: Atherosclerotic calcifications aorta, proximal great vessels, and coronary arteries.  Aorta normal caliber without aneurysm or dissection.  Pulmonary arteries  well opacified and patent.  No evidence of pulmonary embolism.  Small hiatal hernia.  No thoracic adenopathy.  Visualized upper abdomen grossly normal appearance.  Dependent atelectasis BILATERAL lower lobes.  3 mm LEFT lower lobe nodule image 58, nonspecific.  No definite acute infiltrate, pleural effusion or pneumothorax.  Bones unremarkable.  Review of the MIP images confirms the above findings.  IMPRESSION: No evidence pulmonary embolism.  Small hiatal hernia.  Dependent bibasilar atelectasis.  3 mm LEFT lung nodule, recommendation below.  If the patient is at high risk for bronchogenic carcinoma, follow-up chest CT at 1 year is recommended. If the patient is at low risk, no follow-up is needed. This recommendation follows the consensus statement: Guidelines for Management of Small Pulmonary Nodules Detected on CT Scans: A Statement from the [place] as published in Radiology 2005; 237:395-400.   Electronically Signed   By: Lavonia Dana M.D.   On: 10/14/2014 00:26   I have personally reviewed and evaluated these images and lab results as part of my medical decision-making.   EKG Interpretation   Date/Time:  Wednesday October 13 2014 19:11:10 EDT Ventricular Rate:  62 PR Interval:   152 QRS Duration: 82 QT Interval:  394 QTC Calculation: 399 R Axis:   -7 Text Interpretation:  Normal sinus rhythm with sinus arrhythmia Low  voltage QRS Borderline ECG No significant change since last tracing  Confirmed by Debby Freiberg [phone]) on 10/13/2014 9:22:14 PM      MDM   Final diagnoses:  Dyspnea    64 y.o. male with pertinent PMH of COPD, skin cancer, AAA presents with dyspnea, acute onset while at rest.  No chest pain.  Pt was diaphoretic, but otherwise normal.  Symptoms resolved with albuterol.  On my exam, pt asymptomatic, benign exam.  DDimer obtained due to abrupt nature, which was positive.  Subsequent CT scan unremarkable.  Delta trop also unremarkable.  Likely COPD exacerbation.  DC home with steroid course.  I have reviewed all laboratory and imaging studies if ordered as above  1. Dyspnea         Debby Freiberg, MD 10/15/14 [phone]

## 2014-10-13 NOTE — ED Notes (Signed)
Patient currently in CT via stretcher.

## 2014-10-14 LAB — I-STAT TROPONIN, ED: Troponin i, poc: 0 ng/mL (ref 0.00–0.08)

## 2014-10-14 MED ORDER — PREDNISONE 20 MG PO TABS: 60.0000 mg | ORAL_TABLET | Freq: Every day | ORAL | Status: DC

## 2014-10-14 NOTE — ED Notes (Signed)
Discharge instructions reviewed with patient. Understanding verbalized. Patient declined wheelchair at time of discharge. Denies pain. No acute distress noted.

## 2014-10-14 NOTE — Discharge Instructions (Signed)

## 2014-10-19 ENCOUNTER — Encounter: Payer: Self-pay | Admitting: Physician Assistant

## 2014-10-19 DIAGNOSIS — R911 Solitary pulmonary nodule: Secondary | ICD-10-CM | POA: Insufficient documentation

## 2014-10-19 NOTE — Progress Notes (Signed)
Cardiology Office Note Date:  10/20/2014  Patient ID:  [name], [name] Apr 29, 1950, MRN [id number] PCP:  Collene Mares, PA-C  Cardiologist:  Dr. Johnsie Cancel in 2012; here to re-establish care - seen by Dr. Burt Knack.   Chief Complaint: dyspnea  History of Present Illness: [name] is a 64 y.o. male with history of AAA s/p EVAR 05/2010, HTN, HLD, tobacco abuse since age 15, chronic back pain with ridiculopathy, hiatal hernia, pulmonary nodule presents for evaluation of dyspnea.  He was seen in 05/2010 by Dr. Johnsie Cancel for pre-op clearance for AAA repair at which time he was cleared given that he was asymptomatic and had normal EKG. He has a history of low risk nuc in 08/2012 done for chest pain in the hospital - EF 70%, normal WM, decreased uptake along the inferolateral wall is a fixed defect and may be artifactual. He was recently seen in the ER 10/13/14 with dyspnea felt 2/2 COPD exacerbation. D-dimer was elevated and subsequent CTA 10/13/14 showed no PE; small hiatal hernia, 44mm LL nodule with f/u CT recommended 1 year, atherosclerotic calcifications aorta, proximal great vessels, and coronary arteries. Labs notable for WBC 11.5, K 2.9, glucose 104, troponin neg x1, BUN 7, Cr 1.25 (c/w prior).  He was referred here to discuss recent dyspnea.  He has had chronic DOE for about 1 year now. Last week while working in the yard, he broke out into a profuse sweat and then developed severe dyspnea. He had a hard time catching his breath so came to the ER. He was treated with a breathing treatment with significant improvement in symptoms. Workup notable for the above. He has not had any further episodes like this. He denies any chest pain, nausea, vomiting, significant weight change, hemoptysis, LEE, orthopnea, or syncope. He is on blood pressure medicine (Losartan-HCTZ) but says he only takes this PRN, such as when he has a headache and checks BP and it is [id number] systolic. He reports a possible family history  of heart disease, but is unsure of details - father died at 22 - was on HD, possibly died of heart issues. Mother died in her 73s, also questionable heart disease. One of his sisters has heart issues but he doesn't keep in touch with her so he's not really sure. Pulse ox in clinic 96% on RA at rest, 95% with ambulation.   Past Medical History  Diagnosis Date  . Back pain   . Essential hypertension   . Degenerative disk disease     a. Chronic back pain with ridiculopathy.  . Hyperlipidemia   . AAA (abdominal aortic aneurysm)     a. s/p EVAR 05/2010.  Marland Kitchen GERD (gastroesophageal reflux disease)   . Skin cancer   . 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 Tobacco abuse   . Hiatal hernia   . Pulmonary nodule     a. 31mm nodule seen on CT 09/2014 - f/u recommended 1 yr.    Past Surgical History  Procedure Laterality Date  . Appendectomy    . Spinal fusion      of neck and lower back  . Evar  06/20/10    for AAA repair   . Back surgery    . Esophagogastroduodenoscopy (egd) with propofol N/A 06/10/2013    Procedure: ESOPHAGOGASTRODUODENOSCOPY (EGD) WITH PROPOFOL;  Surgeon: Rogene Houston, MD;  Location: [facility];  Service: Endoscopy;  Laterality: N/A;  Venia Minks dilation N/A 06/10/2013  Procedure: MALONEY DILATION 52 fr, 13 fr;  Surgeon: Rogene Houston, MD;  Location: [facility];  Service: Endoscopy;  Laterality: N/A;  . Esophageal biopsy N/A 06/10/2013    Procedure: BIOPSY;  Surgeon: Rogene Houston, MD;  Location: [facility];  Service: Endoscopy;  Laterality: N/A;    Current Outpatient Prescriptions  Medication Sig Dispense Refill  . albuterol (PROVENTIL) (2.5 MG/3ML) 0.083% nebulizer solution Take 2.5 mg by nebulization every 6 (six) hours as needed. Shortness of breath    . aspirin 81 MG tablet Take 81 mg by mouth daily.     . diazepam (VALIUM) 5 MG tablet Take 5 mg by mouth every 6 (six) hours as needed. For anxiety    . esomeprazole (NEXIUM) 20 MG capsule Take 20  mg by mouth daily at 12 noon.    . gabapentin (NEURONTIN) 400 MG capsule Take 400 mg by mouth daily as needed (for neuropathy).     . losartan-hydrochlorothiazide (HYZAAR) 100-12.5 MG per tablet Take 0.5 tablets by mouth as needed.     . Oxycodone HCl 20 MG TABS Take 20 mg by mouth every 4 (four) hours.     No current facility-administered medications for this visit.    Allergies:   Benazepril hcl and Pantoprazole sodium   Social History:  The patient  reports that he has been smoking Cigarettes.  He has been smoking about 0.00 packs per day for the past 0 years. He has never used smokeless tobacco. He reports that he does not drink alcohol or use illicit drugs.   Family History:  The patient's family history includes Atrial fibrillation in his mother; Deep vein thrombosis in his mother; Diabetes in his father and sister; Heart disease in his father, mother, and sister; Hyperlipidemia in his father and mother; Hypertension in his father and mother; Kidney disease in his father; Stroke in his father. ROS:  Please see the history of present illness. All other systems are reviewed and otherwise negative.   PHYSICAL EXAM:  VS:  BP 140/80 mmHg  Pulse 49  Ht 5' 8.5" (1.74 m)  Wt 186 lb (84.369 kg)  BMI 27.87 kg/m2 BMI: Body mass index is 27.87 kg/(m^2). Well nourished, well developed WM in no acute distress, long beard HEENT: normocephalic, atraumatic Neck: no JVD, carotid bruits or masses Cardiac:  normal S1, S2; RRR; no murmurs, rubs, or gallops Lungs:  Slightly diminished BS throughout but otherwise clear to auscultation bilaterally, no wheezing, rhonchi or rales Abd: soft, nontender, no hepatomegaly, + BS MS: no deformity or atrophy Ext: no edema Skin: warm and dry, no rash Neuro:  moves all extremities spontaneously, no focal abnormalities noted, follows commands Psych: euthymic mood, full affect   EKG:  Done today shows sinus bradycardia 49bpm, TW flattening avL, otherwise no acute  changes, QTC 341ms, PR 175ms, QRS 53ms  Recent Labs: 10/13/2014: BUN 7; Creatinine, Ser 1.25*; Hemoglobin 14.2; Platelets 229; Potassium 2.9*; Sodium 141  No results found for requested labs within last 365 days.   Estimated Creatinine Clearance: 66.3 mL/min (by C-G formula based on Cr of 1.25).   Wt Readings from Last 3 Encounters:  10/20/14 186 lb (84.369 kg)  12/11/13 172 lb (78.019 kg)  05/28/13 186 lb (84.369 kg)     Other studies reviewed: Additional studies/records reviewed today include: summarized above  ASSESSMENT AND PLAN:  1. Dyspnea - EKG unremarkable and patient has not had any chest pain. Continue aspirin. Will order exercise nuclear stress test to evaluate for ischemia, as  well as assess his functional status and HR excursion. If abnormal, will need left heart cath. If normal, would continue risk factor modification. Given 45+ year history of tobacco abuse, will also refer him to pulmonology for baseline evaluation. 2. Essential HTN - depending on BMET result (see below), I will consider switching his PRN Losartan-HCTZ to something like amlodipine instead given recent mild Cr elevation and hypokalemia. I feel his goal BP with h/o AAA is closer to <130/80. 3. Hyperlipidemia - followed by primary care provider. Would consider statin initiation based on above risk factors. Will forward note to primary care. We recommended patient f/u there to discuss cholesterol management. 4. Tobacco abuse - counseled regarding cessation. He says he is trying to quit - hasn't smoked in 4-5 days. 5. Recent abnormal lab findings including hypokalemia, mildly elevated WBC and Cr - recheck BMET today. He does remember receiving any potassium repletion in the ER. He should also follow up with PCP for his elevated white blood cell count to make sure this normalizes. 6. Pulmonary nodule - informed patient of this finding. As above, will refer to pulmonology.  The patient was seen and examined by  myself and Dr. Burt Knack and the plan was formulated together.  Disposition: F/u with APP in 4 weeks. F/u PCP for monitoring of cholesterol and WBC.  Current medicines are reviewed at length with the patient today.  The patient did not have any concerns regarding medicines.  Raechel Ache PA-C 10/20/2014 8:37 AM     [place] [address] [phone] (office)  [phone] (fax)

## 2014-10-20 ENCOUNTER — Ambulatory Visit (INDEPENDENT_AMBULATORY_CARE_PROVIDER_SITE_OTHER): Payer: Medicaid Other | Admitting: Physician Assistant

## 2014-10-20 ENCOUNTER — Encounter: Payer: Self-pay | Admitting: Physician Assistant

## 2014-10-20 ENCOUNTER — Telehealth: Payer: Self-pay

## 2014-10-20 VITALS — BP 140/80 | HR 62 | Ht 68.5 in | Wt 186.0 lb

## 2014-10-20 DIAGNOSIS — R911 Solitary pulmonary nodule: Secondary | ICD-10-CM

## 2014-10-20 DIAGNOSIS — E785 Hyperlipidemia, unspecified: Secondary | ICD-10-CM | POA: Diagnosis not present

## 2014-10-20 DIAGNOSIS — R0600 Dyspnea, unspecified: Secondary | ICD-10-CM | POA: Diagnosis not present

## 2014-10-20 DIAGNOSIS — I10 Essential (primary) hypertension: Secondary | ICD-10-CM

## 2014-10-20 DIAGNOSIS — R079 Chest pain, unspecified: Secondary | ICD-10-CM | POA: Diagnosis not present

## 2014-10-20 DIAGNOSIS — I714 Abdominal aortic aneurysm, without rupture, unspecified: Secondary | ICD-10-CM

## 2014-10-20 DIAGNOSIS — N182 Chronic kidney disease, stage 2 (mild): Secondary | ICD-10-CM | POA: Insufficient documentation

## 2014-10-20 DIAGNOSIS — Z720 Tobacco use: Secondary | ICD-10-CM | POA: Diagnosis not present

## 2014-10-20 DIAGNOSIS — E876 Hypokalemia: Secondary | ICD-10-CM

## 2014-10-20 LAB — BASIC METABOLIC PANEL
BUN: 14 mg/dL (ref 6–23)
CHLORIDE: 103 meq/L (ref 96–112)
CO2: 34 meq/L — AB (ref 19–32)
Calcium: 9.2 mg/dL (ref 8.4–10.5)
Creatinine, Ser: 1.27 mg/dL (ref 0.40–1.50)
GFR: 61.11 mL/min (ref >60.00)
Glucose, Bld: 93 mg/dL (ref 70–99)
POTASSIUM: 4.3 meq/L (ref 3.5–5.1)
SODIUM: 140 meq/L (ref 135–145)

## 2014-10-20 MED ORDER — AMLODIPINE BESYLATE 2.5 MG PO TABS: 2.5000 mg | ORAL_TABLET | Freq: Every day | ORAL | Status: DC

## 2014-10-20 NOTE — Progress Notes (Signed)
Quick Note:  Labs reviewed. Potassium is better. Cr is stable - probable CKD stage II.  Since he is not regularly taking losartan-HCTZ, please discontinue this - he was only taking as-needed. With his kidney function and recent low potassium that may not be an ideal med for him.  For improved blood pressure control I would start amlodipine 2.5mg  daily. Follow BP at home and call if running >130/80.  Dayna Dunn PA-C  ______

## 2014-10-20 NOTE — Telephone Encounter (Signed)
Called patient about lab results. Per Melina Copa PA, Potassium is better. Cr is stable - probable CKD stage II. Since he is not regularly taking losartan-HCTZ, please discontinue this - he was only taking as-needed. With his kidney function and recent low potassium that may not be an ideal med for him. For improved blood pressure control I would start amlodipine 2.5mg  daily. Follow BP at home and call if running >130/80. Patient verbalized understanding. Sent medication orders to patient's pharmacy of choice.

## 2014-10-20 NOTE — Patient Instructions (Addendum)
Medication Instructions:  Your physician recommends that you continue on your current medications as directed. Please refer to the Current Medication list given to you today.   Labwork: TODAY:  BMET   Testing/Procedures: Your physician has requested that you have en exercise stress myoview. For further information please visit [email or web address]. Please follow instruction sheet, as given.    Follow-Up: Your physician recommends that you schedule a follow-up appointment in: [place] [facility].    Any Other Special Instructions Will Be Listed Below (If Applicable). 1.  We have referred you to see a Pulmonaryologist for the Pulmonary Nodule.  2.  Please contact your Primary Care Physician and make an appointment for recently elevated WBC count and to monitor your Cholesterol.

## 2014-10-21 ENCOUNTER — Telehealth (HOSPITAL_COMMUNITY): Payer: Self-pay | Admitting: *Deleted

## 2014-10-21 NOTE — Telephone Encounter (Signed)
Left message on voicemail in reference to upcoming appointment scheduled for 10/25/14. Phone number given for a call back so details instructions can be given. Hubbard Robinson, RN

## 2014-10-25 ENCOUNTER — Ambulatory Visit (HOSPITAL_COMMUNITY): Payer: Medicaid Other | Attending: Physician Assistant

## 2014-10-25 DIAGNOSIS — I10 Essential (primary) hypertension: Secondary | ICD-10-CM | POA: Diagnosis not present

## 2014-10-25 DIAGNOSIS — R0600 Dyspnea, unspecified: Secondary | ICD-10-CM | POA: Diagnosis present

## 2014-10-25 DIAGNOSIS — R079 Chest pain, unspecified: Secondary | ICD-10-CM | POA: Diagnosis not present

## 2014-10-25 LAB — MYOCARDIAL PERFUSION IMAGING
CHL CUP NUCLEAR SRS: 6
CHL CUP NUCLEAR SSS: 7
CSEPED: 6 min
CSEPHR: 87 %
Estimated workload: 7 METS
Exercise duration (sec): 0 s
LV dias vol: 96 mL
LV sys vol: 29 mL
MPHR: 159 {beats}/min
Peak HR: 141 {beats}/min
RATE: 0.3
Rest HR: 67 {beats}/min
SDS: 1
TID: 0.91

## 2014-10-25 MED ORDER — TECHNETIUM TC 99M SESTAMIBI GENERIC - CARDIOLITE
32.5000 | Freq: Once | INTRAVENOUS | Status: AC | PRN
  Administered 2014-10-25: 33 via INTRAVENOUS

## 2014-10-25 MED ORDER — TECHNETIUM TC 99M SESTAMIBI GENERIC - CARDIOLITE
10.3000 | Freq: Once | INTRAVENOUS | Status: AC | PRN
  Administered 2014-10-25: 10 via INTRAVENOUS

## 2014-10-26 ENCOUNTER — Encounter: Payer: Self-pay | Admitting: Cardiology

## 2014-10-29 ENCOUNTER — Telehealth: Payer: Self-pay | Admitting: Pulmonary Disease

## 2014-10-29 ENCOUNTER — Telehealth: Payer: Self-pay

## 2014-10-29 ENCOUNTER — Other Ambulatory Visit: Payer: Medicaid Other

## 2014-10-29 ENCOUNTER — Ambulatory Visit (INDEPENDENT_AMBULATORY_CARE_PROVIDER_SITE_OTHER): Payer: Medicaid Other | Admitting: Pulmonary Disease

## 2014-10-29 ENCOUNTER — Encounter: Payer: Self-pay | Admitting: Pulmonary Disease

## 2014-10-29 VITALS — BP 122/82 | HR 55 | Temp 98.5°F | Ht 69.0 in | Wt 190.8 lb

## 2014-10-29 DIAGNOSIS — R1310 Dysphagia, unspecified: Secondary | ICD-10-CM

## 2014-10-29 DIAGNOSIS — R911 Solitary pulmonary nodule: Secondary | ICD-10-CM

## 2014-10-29 DIAGNOSIS — K449 Diaphragmatic hernia without obstruction or gangrene: Secondary | ICD-10-CM

## 2014-10-29 DIAGNOSIS — J439 Emphysema, unspecified: Secondary | ICD-10-CM

## 2014-10-29 DIAGNOSIS — IMO0001 Reserved for inherently not codable concepts without codable children: Secondary | ICD-10-CM

## 2014-10-29 MED ORDER — AMLODIPINE BESYLATE 5 MG PO TABS: 5.0000 mg | ORAL_TABLET | Freq: Every day | ORAL | Status: DC

## 2014-10-29 NOTE — Patient Instructions (Signed)
1. We're going to repeat a CT scan of your chest to follow your lung nodule in 6 months. 2. We're ordering breathing test and a walking test to evaluate you lung function. 3. I'm doing a blood test to check you for the genetic form of COPD. 4. I'm ordering a swallowing test to evaluate your hiatal hernia and see if we can discover why you're having trouble swallowing. 5. Continue to wean herself off of the vapor cigarette. 6. Contact my office if you have any further questions or new breathing problems. 7. I will see you back in 2-3 months or sooner if needed.

## 2014-10-29 NOTE — Telephone Encounter (Signed)
IMAGING CTA CHEST 10/13/14 (personally reviewed by me): 3 mm left lower lobe nodule noted. No other nodule or opacity appreciated. Trace bilateral pleural effusions no appreciated pleural thickening. Dependent atelectasis noted bilaterally. Upper lobe predominant emphysematous changes with subpleural bleb noted within the left upper lobe apex. No pericardial effusion. No pulmonary embolus.  LABS 10/20/14 BMP: 140/4.3/103/24/14/1.27/93/9.2

## 2014-10-29 NOTE — Telephone Encounter (Signed)
Called patient about medication changes per Melina Copa PA. Will send Norvasc 5 mg prescription in patient's pharmacy of choice.  Notes Recorded by Charlie Pitter, PA-C on 10/25/2014 at 4:47 PM Please increase Norvasc to 5mg  daily and keep a log of BP's at home. Bring log to f/u appt. Dayna Dunn PA-C

## 2014-10-29 NOTE — Progress Notes (Signed)
Subjective:    Patient ID: [name], male    DOB: 05/24/1950, 64 y.o.   MRN: [id number]  HPI The patient reports he was standing in his yard talking normally to his son and began having diaphoresis acutely with difficulty breathing. He reports his blood pressure became significantly elevated. He was evaluated in the E.D. and found to have a sub-centimeter lung nodule on a CTA of his chest. He had a nebulizer treatment that seemed to help. He reports that for the last year or so he has had intermittent dyspnea, especially on exertion. He reports chronically he has had a cough productive of a green-yellow mucus. Now his cough produces a clear mucus. Denies any hemoptysis. He reports his cough produces approximately 1/2 cup daily. He denies any wheezing. Denies any chest pain or pressure. He does have dysphagia and odynophagia at times. Does have a known hiatal hernia. No fever, chills, or sweats.   Review of Systems  No rashes but does bruise easily. No adenopathy in his neck, groin, or axilla. A pertinent 14 point review of systems is negative except as per the history of presenting illness.  Allergies  Allergen Reactions  . Benazepril Hcl Other (See Comments)    Can't breathe  . Pantoprazole Sodium Other (See Comments)    Can't breathe    Current Outpatient Prescriptions on File Prior to Visit  Medication Sig Dispense Refill  . albuterol (PROVENTIL) (2.5 MG/3ML) 0.083% nebulizer solution Take 2.5 mg by nebulization every 6 (six) hours as needed. Shortness of breath    . aspirin 81 MG tablet Take 81 mg by mouth daily.     . diazepam (VALIUM) 5 MG tablet Take 5 mg by mouth every 6 (six) hours as needed. For anxiety    . esomeprazole (NEXIUM) 20 MG capsule Take 20 mg by mouth daily at 12 noon.    . gabapentin (NEURONTIN) 400 MG capsule Take 400 mg by mouth daily as needed (for neuropathy).     . Oxycodone HCl 20 MG TABS Take 20 mg by mouth every 4 (four) hours.     No current  facility-administered medications on file prior to visit.    Past Medical History  Diagnosis Date  . Back pain   . Essential hypertension   . Degenerative disk disease     a. Chronic back pain with ridiculopathy.  . Hyperlipidemia   . AAA (abdominal aortic aneurysm) ([place])     a. s/p EVAR 05/2010.  Marland Kitchen GERD (gastroesophageal reflux disease)   . Skin cancer   . 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 Tobacco abuse   . Hiatal hernia   . Pulmonary nodule     a. 65mm nodule seen on CT 09/2014 - f/u recommended 1 yr.  . CKD (chronic kidney disease), stage II   . Emphysema of lung [facility])     Past Surgical History  Procedure Laterality Date  . Appendectomy    . Spinal fusion      of neck and lower back  . Evar  06/20/10    for AAA repair   . Back surgery    . Esophagogastroduodenoscopy (egd) with propofol N/A 06/10/2013    Procedure: ESOPHAGOGASTRODUODENOSCOPY (EGD) WITH PROPOFOL;  Surgeon: Rogene Houston, MD;  Location: [facility];  Service: Endoscopy;  Laterality: N/A;  Venia Minks dilation N/A 06/10/2013    Procedure: MALONEY DILATION 52 fr, 35 fr;  Surgeon: Rogene Houston, MD;  Location: [facility];  Service: Endoscopy;  Laterality: N/A;  . Esophageal biopsy N/A 06/10/2013    Procedure: BIOPSY;  Surgeon: Rogene Houston, MD;  Location: [facility];  Service: Endoscopy;  Laterality: N/A;    Family History  Problem Relation Age of Onset  . Atrial fibrillation Mother   . Heart disease Mother     Patient unclear of what kind  . Deep vein thrombosis Mother   . Hyperlipidemia Mother   . Hypertension Mother   . Stroke Father   . Diabetes Father   . Heart disease Father     Patient unclear of what kind  . Kidney disease Father   . Hyperlipidemia Father   . Hypertension Father   . Diabetes Sister   . Heart disease Sister     Patient unclear of what kind  . Brain cancer Sister   . Seizures Sister   . Cancer Paternal Grandmother   . Lung disease Neg Hx   . Deep  vein thrombosis Other   . Pulmonary embolism Other     Social History   Social History  . Marital Status: Married    Spouse Name: N/A  . Number of Children: N/A  . Years of Education: N/A   Social History Main Topics  . Smoking status: Current Every Day Smoker -- 1.00 packs/day for 48 years    Types: Cigarettes    Start date: 12/16/1965  . Smokeless tobacco: Never Used     Comment: 1 pack a day since age 89; using vapor cigarettes  . Alcohol Use: No  . Drug Use: No  . Sexual Activity: Not Asked   Other Topics Concern  . None   Social History Narrative   Originally from [place], [place]. Always lived in [place]. Travel to the [place] in March 2016. Has traveled to [place], [place], [place], & [place]. 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 No known asbestos exposure. Has 2 parakeets currently. Remotely raised parakeets, love birds, & cockatiels in a different house. No known mold or hot tub exposure.       Objective:   Physical Exam Blood pressure 122/82, pulse 55, temperature 98.5 F (36.9 C), temperature source Oral, height 5\' 9"  (1.753 m), weight 190 lb 12.8 oz (86.546 kg), SpO2 98 %. General:  Awake. Alert. No acute distress. Granddaughters at bedside..  Integument:  Warm & dry. No rash on exposed skin. No bruising. Lymphatics:  No appreciated cervical or supraclavicular lymphadenoapthy. HEENT:  Moist mucus membranes. No oral ulcers. No scleral injection or icterus. Edentulous.  Cardiovascular:  Regular rate. No edema. No appreciable JVD.  Pulmonary:  Good aeration & clear to auscultation bilaterally. Symmetric chest wall expansion. No accessory muscle use. Abdomen: Soft. Normal bowel sounds. Protuberant. Grossly nontender. No hernia appreciated. Musculoskeletal:  Normal bulk and tone. Hand grip strength 5/5 bilaterally. No joint deformity or effusion appreciated. Neurological:  CN 2-12 grossly in tact. No  meningismus. Moving all 4 extremities equally. Symmetric brachioradialis deep tendon reflexes. Psychiatric:  Mood and affect congruent. Speech normal rhythm, rate & tone.   IMAGING CTA CHEST 10/13/14 (personally reviewed by me): 3 mm left lower lobe nodule noted. No other nodule or opacity appreciated. Trace bilateral pleural effusions no appreciated pleural thickening. Dependent atelectasis noted bilaterally. Upper lobe predominant emphysematous changes with subpleural bleb noted within the left upper lobe apex. No pericardial effusion. No pulmonary embolus. Small hiatal hernia noted.  LABS 10/20/14 BMP: 140/4.3/103/24/14/1.27/93/9.2    Assessment & Plan:  64 year old Caucasian male with incidentally found left lower lobe nodule on CTA of the chest. Nodule is 3 mm. Patient has been able to quit smoking cigarettes but is continuing to use a vapor cigarette. His risk of malignancy according to the [facility]'s risk calculator is approximately 3% for malignancy. Given this as well as his long-standing history of tobacco use and family history of malignancy I feel that repeat CT imaging before 1 year is reasonable. I do believe his ongoing issue with cough and dyspnea is likely secondary to his underlying emphysema and quite probably COPD. This requires further investigation. It is somewhat concerning that he is having dysphagia and odynophagia with a hiatal hernia. I feel this should be investigated further as well. I instructed the patient to contact my office if he had any new breathing problems or questions before his next appointment.  1. Left lower lobe lung nodule: Plan for repeat CT scan of the chest without contrast March 2017. 2. Upper lobe predominant emphysema: Screening for alpha-1 antitrypsin deficiency with level and phenotype. Checking full pulmonary function testing as well as 6 minute walk test. Holding on initiating inhaler therapy at this time. 3. Hiatal hernia with  dysphagia/odynophagia: Checking barium swallow. 4. Tobacco use: Patient has been able to abstain for approximately 2 weeks with use of vapor cigarettes. I congratulated him on this but did recommend tapering off use of the vapor cigarette. 5. Follow-up: Patient to return to clinic in 2-3 months or sooner if needed.

## 2014-11-01 LAB — ALPHA-1-ANTITRYPSIN: A-1 Antitrypsin, Ser: 145 mg/dL (ref 83–199)

## 2014-11-02 ENCOUNTER — Ambulatory Visit (HOSPITAL_COMMUNITY)
Admission: RE | Admit: 2014-11-02 | Discharge: 2014-11-02 | Disposition: A | Discharge status: Home / Self Care | Payer: Medicaid Other | Source: Ambulatory Visit | Attending: Pulmonary Disease | Admitting: Pulmonary Disease

## 2014-11-02 ENCOUNTER — Ambulatory Visit (HOSPITAL_COMMUNITY): Payer: Medicaid Other

## 2014-11-02 DIAGNOSIS — K449 Diaphragmatic hernia without obstruction or gangrene: Secondary | ICD-10-CM | POA: Diagnosis present

## 2014-11-02 DIAGNOSIS — K224 Dyskinesia of esophagus: Secondary | ICD-10-CM | POA: Diagnosis not present

## 2014-11-02 DIAGNOSIS — R1310 Dysphagia, unspecified: Secondary | ICD-10-CM | POA: Insufficient documentation

## 2014-11-02 DIAGNOSIS — K219 Gastro-esophageal reflux disease without esophagitis: Secondary | ICD-10-CM | POA: Diagnosis not present

## 2014-11-03 LAB — ALPHA-1 ANTITRYPSIN PHENOTYPE: A1 ANTITRYPSIN: 151 mg/dL (ref 83–199)

## 2014-11-05 ENCOUNTER — Telehealth: Payer: Self-pay | Admitting: Pulmonary Disease

## 2014-11-05 DIAGNOSIS — R1310 Dysphagia, unspecified: Secondary | ICD-10-CM

## 2014-11-05 NOTE — Telephone Encounter (Signed)
Pt called back with a different number to be reached @ [phone].[name]

## 2014-11-05 NOTE — Telephone Encounter (Signed)
Attempted to contact patient regarding the results of his swallowing test. Likely dysphagia coming from the stricture noted on barium swallow. I left a message for him to call my office to get this result and I will call him back once he does so.

## 2014-11-05 NOTE — Telephone Encounter (Signed)
Per previous phone note: William Glazier, MD at 11/05/2014 3:04 PM     Status: Signed       Expand All Collapse All   Attempted to contact patient regarding the results of his swallowing test. Likely dysphagia coming from the stricture noted on barium swallow. I left a message for him to call my office to get this result and I will call him back once he does so      ---  Called pt at number # below. He is aware Dr. Ashok Cordia is out and will advise he call him back. Pt asked to be reached at [phone]. Please advise thanks

## 2014-11-08 NOTE — Telephone Encounter (Signed)
Patient returned call, can be reached at [phone]

## 2014-11-08 NOTE — Telephone Encounter (Signed)
Called spoke with pt. Aware Dr. Ashok Cordia is not back until Wednesday. He is requesting to speak with him since he called personally. Please advise thanks

## 2014-11-09 ENCOUNTER — Telehealth: Payer: Self-pay | Admitting: Pulmonary Disease

## 2014-11-09 NOTE — Telephone Encounter (Signed)
Called Mr. [name] regarding swallow eval. Does he need a referral back to Dr. Laural Golden with GI or does he need to just call for an appointment? Last seen May 2015 with an EGD & dilation at that time. Please call him and let him know if he needs to call for an appointment as he will need to know their office number. Thanks.

## 2014-11-09 NOTE — Telephone Encounter (Signed)
I have placed a referral in EP[facility] to Dr. Laural Golden. PCC's will call with an appt. Nothing further needed

## 2014-11-09 NOTE — Telephone Encounter (Signed)
Patient contacted & given result of barium swallow. May need to have repeat dilation given ongoing symptoms. Has seen Dr. Laural Golden with EGD in May 2015. Plan for referral again to address.

## 2014-11-11 ENCOUNTER — Encounter (INDEPENDENT_AMBULATORY_CARE_PROVIDER_SITE_OTHER): Payer: Self-pay | Admitting: *Deleted

## 2014-11-11 ENCOUNTER — Ambulatory Visit (INDEPENDENT_AMBULATORY_CARE_PROVIDER_SITE_OTHER): Payer: Medicaid Other | Admitting: Internal Medicine

## 2014-11-11 ENCOUNTER — Encounter (INDEPENDENT_AMBULATORY_CARE_PROVIDER_SITE_OTHER): Payer: Self-pay | Admitting: Internal Medicine

## 2014-11-11 ENCOUNTER — Other Ambulatory Visit (INDEPENDENT_AMBULATORY_CARE_PROVIDER_SITE_OTHER): Payer: Self-pay | Admitting: Internal Medicine

## 2014-11-11 VITALS — BP 140/86 | HR 72 | Temp 97.5°F | Ht 68.0 in | Wt 189.8 lb

## 2014-11-11 DIAGNOSIS — R1310 Dysphagia, unspecified: Secondary | ICD-10-CM | POA: Diagnosis not present

## 2014-11-11 NOTE — Patient Instructions (Addendum)
EGD/ED. The risks and benefits such as perforation, bleeding, and infection were reviewed with the patient and is agreeable. Take Nexium one in am and one in pm.

## 2014-11-11 NOTE — Progress Notes (Signed)
Subjective:    Patient ID: [name], male    DOB: 11-11-1950, 64 y.o.   MRN: [id number]  HPI  Presents today with c/o dysphagia. He says he has had symptoms for about a year. He says drinks and foods are lodging at time. Meats in particular will lodge.  He c/o epigastric pain which is worse after eating.  He says he is having some acid reflux. He usually has a BM daily.  No melena or BRRB. Last EGD/ED was last year. Quit smoking 2 weeks ago. Takes Oxycodone 10mg  four times a day.  06/10/2013 EGD with ED. Indications: Patient is 64 year-old Caucasian male with chronic GERD who presents with one year history of solid food dysphagia now occurring 3-4 times a week.He is taking OTC Nexium 2 capsules daily for control of his heartburn.    Impression: Small sliding hiatal hernia otherwise normal EGD. 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 Biopsy taken from esophageal mucosa for routine histology.  Esophageal biopsy is negative for EoE. It shows changes of reflux esophagitis. Biopsy results reviewed with patient; he has some soreness on swallowing but able to swallow much better. Report to PCP Review of Systems Past Medical History  Diagnosis Date  . Back pain   . Essential hypertension   . Degenerative disk disease     a. Chronic back pain with ridiculopathy.  . Hyperlipidemia   . AAA (abdominal aortic aneurysm) ([place])     a. s/p EVAR 05/2010.  Marland Kitchen GERD (gastroesophageal reflux disease)   . Skin cancer   . 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 Tobacco abuse   . Hiatal hernia   . Pulmonary nodule     a. 44mm nodule seen on CT 09/2014 - f/u recommended 1 yr.  . CKD (chronic kidney disease), stage II   . Emphysema of lung [facility])       Past Surgical History  Procedure Laterality Date  . Appendectomy    . Spinal fusion      of neck and lower back  . Evar  06/20/10    for AAA repair   . Back surgery    . Esophagogastroduodenoscopy (egd) with propofol N/A 06/10/2013    Procedure: ESOPHAGOGASTRODUODENOSCOPY (EGD) WITH PROPOFOL;  Surgeon: Rogene Houston, MD;  Location: [facility];  Service: Endoscopy;  Laterality: N/A;  Venia Minks dilation N/A 06/10/2013    Procedure: MALONEY DILATION 52 fr, 27 fr;  Surgeon: Rogene Houston, MD;  Location: [facility];  Service: Endoscopy;  Laterality: N/A;  . Esophageal biopsy N/A 06/10/2013    Procedure: BIOPSY;  Surgeon: Rogene Houston, MD;  Location: [facility];  Service: Endoscopy;  Laterality: N/A;    Allergies  Allergen Reactions  . Benazepril Hcl Other (See Comments)    Can't breathe  . Pantoprazole Sodium Other (See Comments)    Can't breathe    Current Outpatient Prescriptions on File Prior to Visit  Medication Sig Dispense Refill  . albuterol (PROVENTIL) (2.5 MG/3ML) 0.083% nebulizer solution Take 2.5 mg by nebulization every 6 (six) hours as needed. Shortness of breath    . amLODipine (NORVASC) 5 MG tablet Take 1 tablet (5 mg total) by mouth daily. 30 tablet 11  . aspirin 81 MG tablet Take 81 mg by mouth daily.     . diazepam (VALIUM) 5 MG tablet  Take 5 mg by mouth every 6 (six) hours as needed. For anxiety    . esomeprazole (NEXIUM) 20 MG capsule Take 20 mg by mouth daily at 12 noon.    . gabapentin (NEURONTIN) 400 MG capsule Take 400 mg by mouth daily as needed (for neuropathy).     . Oxycodone HCl 20 MG TABS Take 20 mg by mouth every 4 (four) hours.     No current facility-administered medications on file prior to visit.        Objective:   Physical ExamBlood pressure 140/86, pulse 72, temperature 97.5 F (36.4 C), height 5\' 8"  (1.727 m), weight 189 lb 12.8 oz (86.093 kg).   Alert and oriented. Skin warm and dry. Oral mucosa is moist.   . Sclera anicteric, conjunctivae  is pink. Thyroid not enlarged. No cervical lymphadenopathy. Lungs clear. Heart regular rate and rhythm.  Abdomen is soft. Bowel sounds are positive. No hepatomegaly. No abdominal masses felt. No tenderness.  No edema to lower extremities.       Assessment & Plan:  Solid foods dysphagia.  Esophageal web needs to be ruled out. EGD/ED.The risks and benefits such as perforation, bleeding, and infection were reviewed with the patient and is agreeable.

## 2014-11-17 ENCOUNTER — Ambulatory Visit (INDEPENDENT_AMBULATORY_CARE_PROVIDER_SITE_OTHER): Payer: Medicaid Other | Admitting: Cardiology

## 2014-11-17 ENCOUNTER — Encounter: Payer: Self-pay | Admitting: Cardiology

## 2014-11-17 VITALS — BP 98/64 | HR 67 | Ht 68.0 in | Wt 190.8 lb

## 2014-11-17 DIAGNOSIS — R0600 Dyspnea, unspecified: Secondary | ICD-10-CM

## 2014-11-17 DIAGNOSIS — I714 Abdominal aortic aneurysm, without rupture, unspecified: Secondary | ICD-10-CM

## 2014-11-17 DIAGNOSIS — R911 Solitary pulmonary nodule: Secondary | ICD-10-CM

## 2014-11-17 DIAGNOSIS — Z720 Tobacco use: Secondary | ICD-10-CM | POA: Diagnosis not present

## 2014-11-17 DIAGNOSIS — I10 Essential (primary) hypertension: Secondary | ICD-10-CM | POA: Diagnosis not present

## 2014-11-17 MED ORDER — AMLODIPINE BESYLATE 10 MG PO TABS: 10.0000 mg | ORAL_TABLET | Freq: Every day | ORAL | Status: DC

## 2014-11-17 NOTE — Progress Notes (Signed)
Cardiology Office Note   Date:  11/17/2014   ID:  [name], DOB [birth year], MRN [id number]  PCP:  Collene Mares, PA-C  Cardiologist:  Dr. Burt Knack    Chief Complaint  Patient presents with  . Hypertension      History of Present Illness: [name] is a 64 y.o. male who presents for BP follow up and results of stress test.   Pt was seen post hospital follow-up in Sept.  He had been upstanding in his yard talking normally to his son and began having diaphoresis acutely with difficulty breathing. He reports his blood pressure became significantly elevated. He was evaluated in the E.D. and found to have a sub-centimeter lung nodule on a CTA of his chest. He had a nebulizer treatment that seemed to help. He reports that for the last year or so he has had intermittent dyspnea, especially on exertion. He reports chronically he has had a cough productive of a green-yellow mucus. Now his cough produces a clear mucus. Denies any hemoptysis. He reports his cough produces approximately 1/2 cup daily. He denies any wheezing. Denies any chest pain or pressure. He does have dysphagia and odynophagia at times. Does have a known hiatal hernia. No fever, chills, or sweats.  He has abd pain "like something moving"  Recently he had Ba swallow and evidence for stricture, GERD, and Hiatal hernia.  Plans are for  EGD on the 11th.  He has a history of AAA s/p EVAR 05/2010, HTN, HLD, tobacco abuse since age 26, chronic back pain with ridiculopathy, hiatal hernia, pulmonary nodule presents for evaluation of dyspnea.  He was seen in 05/2010 by Dr. Johnsie Cancel for pre-op clearance for AAA repair at which time he was cleared given that he was asymptomatic and had normal EKG. He has a history of low risk nuc in 08/2012 done for chest pain in the hospital - EF 70%, normal WM, decreased uptake along the inferolateral wall is a fixed defect and may be artifactual. He was recently seen in the ER 10/13/14 with dyspnea felt  2/2 COPD exacerbation. D-dimer was elevated and subsequent CTA 10/13/14 showed no PE; small hiatal hernia, 70mm LL nodule with f/u CT recommended 1 year, atherosclerotic calcifications aorta, proximal great vessels, and coronary arteries. Labs notable for WBC 11.5, K 2.9, glucose 104, troponin neg x1, BUN 7, Cr 1.25 (c/w prior). He was referred here to discuss recent dyspnea.  He has had chronic DOE for about 1 year now. He has been seen by Dr. Milinda Hirschfeld with pul. And pulse ox in clinic 96% on RA at rest, 95% with ambulation.  He will follow up with him with repeat chest CT.    Stress test was normal.  We stopped his losartan that he took prn and added amlodipine at 2.5, this was increased to 5 mg after stress test.   Today pt's BP is low but he admits that pressures have been in 536-144 range systolic.  He took a little of his losartan hctz maybe a forth of tab yesterday.    Only complaint id occ abd pain.  Last abd u/s was stable.  He is for EGD next month.  He has decreased tobacco to none to 1 a week, he is using vapor at 10 nicotine, he will begin to decrease.     Stopped smoking. Past Medical History  Diagnosis Date  . Back pain   . Essential hypertension   . Degenerative disk disease  a. Chronic back pain with ridiculopathy.  . Hyperlipidemia   . AAA (abdominal aortic aneurysm) ([place])     a. s/p EVAR 05/2010.  Marland Kitchen GERD (gastroesophageal reflux disease)   . Skin cancer   . 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 Tobacco abuse   . Hiatal hernia   . Pulmonary nodule     a. 73mm nodule seen on CT 09/2014 - f/u recommended 1 yr.  . CKD (chronic kidney disease), stage II   . Emphysema of lung [facility])     Past Surgical History  Procedure Laterality Date  . Appendectomy    . Spinal fusion      of neck and lower back  . Evar  06/20/10    for AAA repair   . Back surgery    . Esophagogastroduodenoscopy (egd) with propofol N/A 06/10/2013    Procedure:  ESOPHAGOGASTRODUODENOSCOPY (EGD) WITH PROPOFOL;  Surgeon: Rogene Houston, MD;  Location: [facility];  Service: Endoscopy;  Laterality: N/A;  Venia Minks dilation N/A 06/10/2013    Procedure: MALONEY DILATION 52 fr, 69 fr;  Surgeon: Rogene Houston, MD;  Location: [facility];  Service: Endoscopy;  Laterality: N/A;  . Esophageal biopsy N/A 06/10/2013    Procedure: BIOPSY;  Surgeon: Rogene Houston, MD;  Location: [facility];  Service: Endoscopy;  Laterality: N/A;     Current Outpatient Prescriptions  Medication Sig Dispense Refill  . albuterol (PROVENTIL) (2.5 MG/3ML) 0.083% nebulizer solution Take 2.5 mg by nebulization every 6 (six) hours as needed. Shortness of breath    . amLODipine (NORVASC) 10 MG tablet Take 1 tablet (10 mg total) by mouth daily. 30 tablet 6  . aspirin 81 MG tablet Take 81 mg by mouth daily.     . diazepam (VALIUM) 5 MG tablet Take 5 mg by mouth every 6 (six) hours as needed. For anxiety    . esomeprazole (NEXIUM) 20 MG capsule Take 20 mg by mouth 2 (two) times daily before a meal.     . gabapentin (NEURONTIN) 400 MG capsule Take 400 mg by mouth daily as needed (for neuropathy).     . Oxycodone HCl 20 MG TABS Take 20 mg by mouth every 4 (four) hours.     No current facility-administered medications for this visit.    Allergies:   Benazepril hcl and Pantoprazole sodium    Social History:  The patient  reports that he has quit smoking. His smoking use included Cigarettes. He started smoking about 48 years ago. He has a 48 pack-year smoking history. He has never used smokeless tobacco. He reports that he does not drink alcohol or use illicit drugs.   Family History:  The patient's family history includes Atrial fibrillation in his mother; Brain cancer in his sister; Cancer in his paternal grandmother; Deep vein thrombosis in his mother and other; Diabetes in his father and sister; Heart disease in his father, mother, and sister; Hyperlipidemia in his father and mother; Hypertension in  his father and mother; Kidney disease in his father; Pulmonary embolism in his other; Seizures in his sister; Stroke in his father. There is no history of Lung disease.    ROS:  General:no colds or fevers, no weight changes Skin:no rashes or ulcers HEENT:no blurred vision, no congestion CV:see HPI PUL:see HPI GI:no diarrhea constipation or melena, no indigestion GU:no hematuria, no dysuria MS:no joint pain, no claudication Neuro:no syncope, no lightheadedness Endo:no diabetes, no thyroid disease Wt Readings from  Last 3 Encounters:  11/17/14 190 lb 12.8 oz (86.546 kg)  11/11/14 189 lb 12.8 oz (86.093 kg)  10/29/14 190 lb 12.8 oz (86.546 kg)     PHYSICAL EXAM: VS:  BP 98/64 mmHg  Pulse 67  Ht 5\' 8"  (1.727 m)  Wt 190 lb 12.8 oz (86.546 kg)  BMI 29.02 kg/m2  SpO2 97% , BMI Body mass index is 29.02 kg/(m^2). General:Pleasant affect, NAD Skin:Warm and dry, brisk capillary refill HEENT:normocephalic, sclera clear, mucus membranes moist Neck:supple, no JVD, no bruits  Heart:S1S2 RRR without murmur, gallup, rub or click Lungs:clear without rales, rhonchi, or wheezes DPT:ELMR, non tender, + BS, do not palpate liver spleen or masses Ext:no lower ext edema, 2+ pedal pulses, 2+ radial pulses Neuro:alert and oriented, MAE, follows commands, + facial symmetry    EKG:  EKG is NOT  ordered today.      Recent Labs: 10/13/2014: Hemoglobin 14.2; Platelets 229 10/20/2014: BUN 14; Creatinine, Ser 1.27; Potassium 4.3; Sodium 140    Lipid Panel    Component Value Date/Time   CHOL 168 09/06/2012 0914   TRIG 172* 09/06/2012 0914   HDL 27* 09/06/2012 0914   CHOLHDL 6.2 09/06/2012 0914   VLDL 34 09/06/2012 0914   LDLCALC 107* 09/06/2012 0914       Other studies Reviewed: Additional studies/ records that were reviewed today include: previous notes, pulmonary, cardiology..   ASSESSMENT AND PLAN:  1.  dyspnea continues, followed by pulmonary - neg nuclear cardiac study.  +  esophageal stricture and GERD may be playing large role.  Along wit tobacco.  2. Tobacco use has stopped except for vapor cigarettes. We discussed decreasing amount of nicotine.   3. HTN today appears low but he took losartan HCTZ yesterday for BP 615-183 systolic. I increased his amlodipine to 10 mg daily he will call if BP continues to be elevated.  He will follow up with [facility] in 2 months and Dr. Burt Knack in 6 months.   4. Pulmonary nodules, followed by Pulmonary  5. GERD, esophageal stricture foe EGD on the 11th of Nov.  Cardiac risk is low for EGD.         Current medicines are reviewed with the patient today.  The patient Has no concerns regarding medicines.  The following changes have been made:  See above Labs/ tests ordered today include:see above  Disposition:   FU:  see above  Signed, Isaiah Serge, NP  11/17/2014 4:44 PM    [facility] [place], [place], [address] [place], [place] Phone: [phone]; Fax: [phone]

## 2014-11-17 NOTE — Patient Instructions (Signed)
Medication Instructions:  Your physician has recommended you make the following change in your medication:   1-Increase Amlodipine 10 mg by mouth daily  Labwork: NONE  Testing/Procedures: NONE  Follow-Up: Your physician recommends that you schedule a follow-up appointment in: 2 months with a PA or NP  Your physician wants you to follow-up in: 6 months with Dr. Burt Knack. You will receive a reminder letter in the mail two months in advance. If you don't receive a letter, please call our office to schedule the follow-up appointment.    Any Other Special Instructions Will Be Listed Below (If Applicable).     If you need a refill on your cardiac medications before your next appointment, please call your pharmacy.

## 2014-11-24 ENCOUNTER — Telehealth (INDEPENDENT_AMBULATORY_CARE_PROVIDER_SITE_OTHER): Payer: Self-pay | Admitting: *Deleted

## 2014-11-24 ENCOUNTER — Other Ambulatory Visit (INDEPENDENT_AMBULATORY_CARE_PROVIDER_SITE_OTHER): Payer: Self-pay | Admitting: Internal Medicine

## 2014-11-24 MED ORDER — ESOMEPRAZOLE MAGNESIUM 20 MG PO CPDR: 20.0000 mg | DELAYED_RELEASE_CAPSULE | Freq: Two times a day (BID) | ORAL | Status: DC

## 2014-11-24 NOTE — Telephone Encounter (Signed)
[name] needs a Rx for Nexium 2 twice daily sent to his pharmacy. [facility] uses [facility] [address]. [place].

## 2014-11-24 NOTE — Telephone Encounter (Signed)
Rx refilled.

## 2014-11-24 NOTE — Telephone Encounter (Signed)
rx refilled.

## 2014-11-26 NOTE — Patient Instructions (Signed)
[name]  11/26/2014     @PREFPERIOPPHARMACY @   Your procedure is scheduled on  12/03/2014  Report to [facility] at  700  A.M.  Call this number if you have problems the morning of surgery:  [phone]   Remember:  Do not eat food or drink liquids after midnight.  Take these medicines the morning of surgery with A SIP OF WATER  Amlodipine, valium, nexium, neurontin, oxycodone.   Do not wear jewelry, make-up or nail polish.  Do not wear lotions, powders, or perfumes.  You may wear deodorant.  Do not shave 48 hours prior to surgery.  Men may shave face and neck.  Do not bring valuables to the hospital.  [facility] is not responsible for any belongings or valuables.  Contacts, dentures or bridgework may not be worn into surgery.  Leave your suitcase in the car.  After surgery it may be brought to your room.  For patients admitted to the hospital, discharge time will be determined by your treatment team.  Patients discharged the day of surgery will not be allowed to drive home.   Name and phone number of your driver:   family Special instructions:  Follow the diet instructions given to you by Dr Olevia Perches office.  Please read over the following fact sheets that you were given. Pain Booklet, Coughing and Deep Breathing, Surgical Site Infection Prevention, Anesthesia Post-op Instructions and Care and Recovery After Surgery      Esophagogastroduodenoscopy Esophagogastroduodenoscopy (EGD) is a procedure that is used to examine the lining of the esophagus, stomach, and first part of the small intestine (duodenum). A long, flexible, lighted tube with a camera attached (endoscope) is inserted down the throat to view these organs. This procedure is done to detect problems or abnormalities, such as inflammation, bleeding, ulcers, or growths, in order to treat them. The procedure lasts 5-20 minutes. It is usually an outpatient procedure, but it may need to be performed in a  hospital in emergency cases. LET [facility] CARE PROVIDER KNOW ABOUT:  Any allergies you have.  All medicines you are taking, including vitamins, herbs, eye drops, creams, and over-the-counter medicines.  Previous problems you or members of your family have had with the use of anesthetics.  Any blood disorders you have.  Previous surgeries you have had.  Medical conditions you have. RISKS AND COMPLICATIONS Generally, this is a safe procedure. However, problems can occur and include:  Infection.  Bleeding.  Tearing (perforation) of the esophagus, stomach, or duodenum.  Difficulty breathing or not being able to breathe.  Excessive sweating.  Spasms of the larynx.  Slowed heartbeat.  Low blood pressure. BEFORE THE PROCEDURE  Do not eat or drink anything after midnight on the night before the procedure or as directed by your health care provider.  Do not take your regular medicines before the procedure if your health care provider asks you not to. Ask your health care provider about changing or stopping those medicines.  If you wear dentures, be prepared to remove them before the procedure.  Arrange for someone to drive you home after the procedure. PROCEDURE  A numbing medicine (local anesthetic) may be sprayed in your throat for comfort and to stop you from gagging or coughing.  You will have an IV tube inserted in a vein in your hand or arm. You will receive medicines and fluids through this tube.  You will be given a medicine to relax you (  sedative).  A pain reliever will be given through the IV tube.  A mouth guard may be placed in your mouth to protect your teeth and to keep you from biting on the endoscope.  You will be asked to lie on your left side.  The endoscope will be inserted down your throat and into your esophagus, stomach, and duodenum.  Air will be put through the endoscope to allow your health care provider to clearly view the lining of your  esophagus.  The lining of your esophagus, stomach, and duodenum will be examined. During the exam, your health care provider may:  Remove tissue to be examined under a microscope (biopsy) for inflammation, infection, or other medical problems.  Remove growths.  Remove objects (foreign bodies) that are stuck.  Treat any bleeding with medicines or other devices that stop tissues from bleeding (hot cautery, clipping devices).  Widen (dilate) or stretch narrowed areas of your esophagus and stomach.  The endoscope will be withdrawn. AFTER THE PROCEDURE  You will be taken to a recovery area for observation. Your blood pressure, heart rate, breathing rate, and blood oxygen level will be monitored often until the medicines you were given have worn off.  Do not eat or drink anything until the numbing medicine has worn off and your gag reflex has returned. You may choke.  Your health care provider should be able to discuss his or her findings with you. It will take longer to discuss the test results if any biopsies were taken.   This information is not intended to replace advice given to you by your health care provider. Make sure you discuss any questions you have with your health care provider.   Document Released: 05/11/2004 Document Revised: 01/29/2014 Document Reviewed: 12/12/2011 Elsevier Interactive Patient Education 2016 [employer] PATIENT INSTRUCTIONS POST-ANESTHESIA  IMMEDIATELY FOLLOWING SURGERY:  Do not drive or operate machinery for the first twenty four hours after surgery.  Do not make any important decisions for twenty four hours after surgery or while taking narcotic pain medications or sedatives.  If you develop intractable nausea and vomiting or a severe headache please notify your doctor immediately.  FOLLOW-UP:  Please make an appointment with your surgeon as instructed. You do not need to follow up with anesthesia unless specifically instructed to do so.  WOUND CARE  INSTRUCTIONS (if applicable):  Keep a dry clean dressing on the anesthesia/puncture wound site if there is drainage.  Once the wound has quit draining you may leave it open to air.  Generally you should leave the bandage intact for twenty four hours unless there is drainage.  If the epidural site drains for more than 36-48 hours please call the anesthesia department.  QUESTIONS?:  Please feel free to call your physician or the hospital operator if you have any questions, and they will be happy to assist you.

## 2014-11-29 ENCOUNTER — Encounter (HOSPITAL_COMMUNITY)
Admission: RE | Admit: 2014-11-29 | Discharge: 2014-11-29 | Disposition: A | Discharge status: Home / Self Care | Payer: Medicaid Other | Source: Ambulatory Visit | Attending: Internal Medicine | Admitting: Internal Medicine

## 2014-11-29 ENCOUNTER — Encounter (HOSPITAL_COMMUNITY): Payer: Self-pay

## 2014-11-29 ENCOUNTER — Other Ambulatory Visit: Payer: Self-pay

## 2014-11-29 DIAGNOSIS — R1310 Dysphagia, unspecified: Secondary | ICD-10-CM | POA: Insufficient documentation

## 2014-11-29 DIAGNOSIS — Z01818 Encounter for other preprocedural examination: Secondary | ICD-10-CM | POA: Diagnosis present

## 2014-11-29 LAB — CBC WITH DIFFERENTIAL/PLATELET
Basophils Absolute: 0.1 10*3/uL (ref 0.0–0.1)
Basophils Relative: 1 %
EOS ABS: 0.4 10*3/uL (ref 0.0–0.7)
EOS PCT: 4 %
HCT: 39.9 % (ref 39.0–52.0)
Hemoglobin: 14.2 g/dL (ref 13.0–17.0)
LYMPHS ABS: 2.8 10*3/uL (ref 0.7–4.0)
Lymphocytes Relative: 29 %
MCH: 31 pg (ref 26.0–34.0)
MCHC: 35.6 g/dL (ref 30.0–36.0)
MCV: 87.1 fL (ref 78.0–100.0)
MONO ABS: 0.7 10*3/uL (ref 0.1–1.0)
MONOS PCT: 7 %
Neutro Abs: 5.8 10*3/uL (ref 1.7–7.7)
Neutrophils Relative %: 59 %
PLATELETS: 254 10*3/uL (ref 150–400)
RBC: 4.58 MIL/uL (ref 4.22–5.81)
RDW: 12.3 % (ref 11.5–15.5)
WBC: 9.7 10*3/uL (ref 4.0–10.5)

## 2014-11-29 LAB — BASIC METABOLIC PANEL
Anion gap: 5 (ref 5–15)
BUN: 10 mg/dL (ref 6–20)
CO2: 28 mmol/L (ref 22–32)
CREATININE: 1.31 mg/dL — AB (ref 0.61–1.24)
Calcium: 9.4 mg/dL (ref 8.9–10.3)
Chloride: 104 mmol/L (ref 101–111)
GFR calc Af Amer: >60 mL/min (ref >60)
GFR, EST NON AFRICAN AMERICAN: 57 mL/min — AB (ref >60)
GLUCOSE: 121 mg/dL — AB (ref 65–99)
Potassium: 4.2 mmol/L (ref 3.5–5.1)
SODIUM: 137 mmol/L (ref 135–145)

## 2014-12-03 ENCOUNTER — Ambulatory Visit (HOSPITAL_COMMUNITY): Payer: Medicaid Other | Admitting: Anesthesiology

## 2014-12-03 ENCOUNTER — Ambulatory Visit (HOSPITAL_COMMUNITY)
Admission: RE | Admit: 2014-12-03 | Discharge: 2014-12-03 | Disposition: A | Discharge status: Home / Self Care | Payer: Medicaid Other | Source: Ambulatory Visit | Attending: Internal Medicine | Admitting: Internal Medicine

## 2014-12-03 ENCOUNTER — Encounter (HOSPITAL_COMMUNITY)
Admission: RE | Disposition: A | Discharge status: Home / Self Care | Payer: Self-pay | Source: Ambulatory Visit | Attending: Internal Medicine

## 2014-12-03 ENCOUNTER — Encounter (HOSPITAL_COMMUNITY): Payer: Self-pay | Admitting: *Deleted

## 2014-12-03 DIAGNOSIS — K449 Diaphragmatic hernia without obstruction or gangrene: Secondary | ICD-10-CM | POA: Diagnosis not present

## 2014-12-03 DIAGNOSIS — R1310 Dysphagia, unspecified: Secondary | ICD-10-CM | POA: Insufficient documentation

## 2014-12-03 DIAGNOSIS — K219 Gastro-esophageal reflux disease without esophagitis: Secondary | ICD-10-CM | POA: Diagnosis not present

## 2014-12-03 DIAGNOSIS — Z809 Family history of malignant neoplasm, unspecified: Secondary | ICD-10-CM | POA: Diagnosis not present

## 2014-12-03 DIAGNOSIS — Z7982 Long term (current) use of aspirin: Secondary | ICD-10-CM | POA: Insufficient documentation

## 2014-12-03 DIAGNOSIS — N182 Chronic kidney disease, stage 2 (mild): Secondary | ICD-10-CM | POA: Insufficient documentation

## 2014-12-03 DIAGNOSIS — Z87891 Personal history of nicotine dependence: Secondary | ICD-10-CM | POA: Insufficient documentation

## 2014-12-03 DIAGNOSIS — Z79899 Other long term (current) drug therapy: Secondary | ICD-10-CM | POA: Insufficient documentation

## 2014-12-03 DIAGNOSIS — I129 Hypertensive chronic kidney disease with stage 1 through stage 4 chronic kidney disease, or unspecified chronic kidney disease: Secondary | ICD-10-CM | POA: Insufficient documentation

## 2014-12-03 DIAGNOSIS — E1122 Type 2 diabetes mellitus with diabetic chronic kidney disease: Secondary | ICD-10-CM | POA: Diagnosis not present

## 2014-12-03 DIAGNOSIS — I714 Abdominal aortic aneurysm, without rupture: Secondary | ICD-10-CM | POA: Diagnosis not present

## 2014-12-03 DIAGNOSIS — K3189 Other diseases of stomach and duodenum: Secondary | ICD-10-CM | POA: Diagnosis not present

## 2014-12-03 DIAGNOSIS — R109 Unspecified abdominal pain: Secondary | ICD-10-CM | POA: Insufficient documentation

## 2014-12-03 HISTORY — PX: BIOPSY: SHX5522

## 2014-12-03 HISTORY — PX: ESOPHAGEAL DILATION: SHX303

## 2014-12-03 HISTORY — PX: ESOPHAGOGASTRODUODENOSCOPY (EGD) WITH PROPOFOL: SHX5813

## 2014-12-03 SURGERY — ESOPHAGOGASTRODUODENOSCOPY (EGD) WITH PROPOFOL
Anesthesia: Monitor Anesthesia Care

## 2014-12-03 MED ORDER — FENTANYL CITRATE (PF) 100 MCG/2ML IJ SOLN
25.0000 ug | INTRAMUSCULAR | Status: AC
Start: 2014-12-03 — End: 2014-12-03
  Administered 2014-12-03: 25 ug via INTRAVENOUS

## 2014-12-03 MED ORDER — ONDANSETRON HCL 4 MG/2ML IJ SOLN: 4.0000 mg | Freq: Once | INTRAMUSCULAR | Status: DC | PRN

## 2014-12-03 MED ORDER — FENTANYL CITRATE (PF) 100 MCG/2ML IJ SOLN: 25.0000 ug | INTRAMUSCULAR | Status: DC | PRN

## 2014-12-03 MED ORDER — ESOMEPRAZOLE MAGNESIUM 40 MG PO CPDR: 40.0000 mg | DELAYED_RELEASE_CAPSULE | Freq: Two times a day (BID) | ORAL | Status: DC

## 2014-12-03 MED ORDER — PROPOFOL 10 MG/ML IV BOLUS
INTRAVENOUS | Status: AC
  Filled 2014-12-03: qty 20

## 2014-12-03 MED ORDER — ONDANSETRON HCL 4 MG/2ML IJ SOLN
INTRAMUSCULAR | Status: AC
  Filled 2014-12-03: qty 2

## 2014-12-03 MED ORDER — LIDOCAINE HCL (CARDIAC) 10 MG/ML IV SOLN
INTRAVENOUS | Status: DC | PRN
  Administered 2014-12-03: 50 mg via INTRAVENOUS

## 2014-12-03 MED ORDER — BUTAMBEN-TETRACAINE-BENZOCAINE 2-2-14 % EX AERO
INHALATION_SPRAY | CUTANEOUS | Status: DC | PRN
  Administered 2014-12-03: 2 via TOPICAL

## 2014-12-03 MED ORDER — STERILE WATER FOR IRRIGATION IR SOLN
Status: DC | PRN
  Administered 2014-12-03: 1000 mL

## 2014-12-03 MED ORDER — FENTANYL CITRATE (PF) 100 MCG/2ML IJ SOLN
INTRAMUSCULAR | Status: AC
  Filled 2014-12-03: qty 2

## 2014-12-03 MED ORDER — LACTATED RINGERS IV SOLN
INTRAVENOUS | Status: DC
  Administered 2014-12-03: 1000 mL via INTRAVENOUS

## 2014-12-03 MED ORDER — LIDOCAINE HCL (PF) 1 % IJ SOLN
INTRAMUSCULAR | Status: AC
  Filled 2014-12-03: qty 5

## 2014-12-03 MED ORDER — ONDANSETRON HCL 4 MG/2ML IJ SOLN
4.0000 mg | Freq: Once | INTRAMUSCULAR | Status: AC
  Administered 2014-12-03: 4 mg via INTRAVENOUS

## 2014-12-03 MED ORDER — PROPOFOL 500 MG/50ML IV EMUL
INTRAVENOUS | Status: DC | PRN
  Administered 2014-12-03: 125 ug/kg/min via INTRAVENOUS

## 2014-12-03 MED ORDER — MIDAZOLAM HCL 2 MG/2ML IJ SOLN
1.0000 mg | INTRAMUSCULAR | Status: DC | PRN
  Administered 2014-12-03: 2 mg via INTRAVENOUS

## 2014-12-03 MED ORDER — MIDAZOLAM HCL 2 MG/2ML IJ SOLN
INTRAMUSCULAR | Status: AC
  Filled 2014-12-03: qty 2

## 2014-12-03 SURGICAL SUPPLY — 10 items
BLOCK BITE 60FR ADLT L/F BLUE (MISCELLANEOUS) ×2 IMPLANT
FLOOR PAD 36X40 (MISCELLANEOUS) ×4
FORCEPS BIOP RAD 4 LRG CAP 4 (CUTTING FORCEPS) ×2 IMPLANT
FORMALIN 10 PREFIL 20ML (MISCELLANEOUS) ×2 IMPLANT
KIT ENDO PROCEDURE PEN (KITS) ×4 IMPLANT
PAD FLOOR 36X40 (MISCELLANEOUS) IMPLANT
SYR 50ML LL SCALE MARK (SYRINGE) ×2 IMPLANT
TUBING INSUFFLATOR CO2MPACT (TUBING) ×4 IMPLANT
TUBING IRRIGATION ENDOGATOR (MISCELLANEOUS) ×2 IMPLANT
WATER STERILE IRR 1000ML POUR (IV SOLUTION) ×2 IMPLANT

## 2014-12-03 NOTE — Anesthesia Procedure Notes (Signed)
Procedure Name: MAC Date/Time: 12/03/2014 7:45 AM Performed by: Vista Deck Pre-anesthesia Checklist: Patient identified, Emergency Drugs available, Suction available, Timeout performed and Patient being monitored Patient Re-evaluated:Patient Re-evaluated prior to inductionOxygen Delivery Method: Non-rebreather mask

## 2014-12-03 NOTE — Anesthesia Preprocedure Evaluation (Signed)
Anesthesia Evaluation  Patient identified by MRN, date of birth, ID band Patient awake    Reviewed: Allergy & Precautions, H&P , NPO status , Patient's Chart, lab work & pertinent test results  Airway Mallampati: III  TM Distance: >3 FB Neck ROM: Limited    Dental  (+) Edentulous Upper, Edentulous Lower   Pulmonary COPD, Current Smoker, former smoker,    breath sounds clear to auscultation       Cardiovascular hypertension, Pt. on medications + Peripheral Vascular Disease   Rhythm:Regular Rate:Normal     Neuro/Psych    GI/Hepatic GERD (dysphagia)  Medicated,  Endo/Other    Renal/GU      Musculoskeletal Cervical fusion w/ decreased ROM    Abdominal   Peds  Hematology   Anesthesia Other Findings   Reproductive/Obstetrics                             Anesthesia Physical Anesthesia Plan  ASA: III  Anesthesia Plan: MAC   Post-op Pain Management:    Induction: Intravenous  Airway Management Planned: Simple Face Mask  Additional Equipment:   Intra-op Plan:   Post-operative Plan:   Informed Consent: I have reviewed the patients History and Physical, chart, labs and discussed the procedure including the risks, benefits and alternatives for the proposed anesthesia with the patient or authorized representative who has indicated his/her understanding and acceptance.     Plan Discussed with:   Anesthesia Plan Comments:         Anesthesia Quick Evaluation

## 2014-12-03 NOTE — Op Note (Signed)
EGD PROCEDURE REPORT  PATIENT:  [name]  MR#:  [id number] Birthdate:  11/10/1950, 64 y.o., male Endoscopist:  Dr. Rogene Houston, MD Referred By:  Mr. Collene Mares PA-C  Procedure Date: 12/03/2014  Procedure:   EGD with ED  Indications:  Patient is 64 year old Caucasian male with multiple medical problems including chronic GERD who presents with 6 month history of solid food dysphagia. He also has burning and pain until food bolus passes distally. He points to mid and upper sternum area soft bolus obstruction. Recent esophagogram suggested stricture at GE junction. EGD in May last year revealed small sliding hiatal hernia and possibly esophageal web. Biopsies are negative for EoE. He has daily heartburn despite of taking OTC Nexium.            Informed Consent:  The risks, benefits, alternatives & imponderables which include, but are not limited to, bleeding, infection, perforation, drug reaction and potential missed lesion have been reviewed.  The potential for biopsy, lesion removal, esophageal dilation, etc. have also been discussed.  Questions have been answered.  All parties agreeable.  Please see history & physical in medical record for more information.  Medications:  Cetacaine spray topically for oropharyngeal anesthesia Monitored anesthesia care. Please see anesthesia records for details.  Description of procedure:  The endoscope was introduced through the mouth and advanced to the second portion of the duodenum without difficulty or limitations. The mucosal surfaces were surveyed very carefully during advancement of the scope and upon withdrawal.  Findings:  Esophagus:  Mucosa of the esophagus was normal. Gastroesophageal junction was wavy but no ring or stricture identified. GEJ:  39 cm Hiatus:  41 cm Stomach:  Stomach was empty and distended very well with insufflation. Folds in the proximal stomach were normal. Examination mucosa and gastric body, antrum, pyloric  channel, angularis fundus and cardia was normal. Duodenum:  Normal bulbar and post bulbar mucosa.  Therapeutic/Diagnostic Maneuvers Performed:   Esophagus was dilated by passing 54 [place] Maloney dilator to full insertion. Endoscope was passed again and no disruption noted to esophageal mucosa. Esophagus was then dilated by passing 56 [place] Maloney dilator to full insertion. Endoscope was passed again and very small linear mucosal disruption noted at cervical esophagus just below UES. Biopsies from taken from GE junction and endoscope was withdrawn.  Complications:  None  EBL: Minimal  Impression: No evidence of erosive esophagitis or esophageal stricture. Serrated or wavy GE junction with small sliding hiatal hernia. Normal examination stomach first and second part of the duodenum. Esophagus dilated by passing 54 and 56 [place] Maloney dilators resulting in very small mucosal disruption at proximal esophagus below UES consistent with disrupted esophageal web. Biopsies taken from GE junction.  Comment: If he remains with dysphagia will need to do esophageal manometry looking for esophageal motility disorder..   Recommendations:  Standard instructions given. Anti-reflux measures reinforced. Increase Esomeprazole to 40 mg by mouth twice a day. I will be contacting patient with biopsy results and further recommendations.   Nollan Muldrow U  12/03/2014  8:40 AM  CC: Dr. Collene Mares, Marland Kitchen, PA-C & Dr. Rayne Du ref. provider found

## 2014-12-03 NOTE — Transfer of Care (Signed)
Immediate Anesthesia Transfer of Care Note  Patient: [name]  Procedure(s) Performed: Procedure(s) (LRB): ESOPHAGOGASTRODUODENOSCOPY (EGD) WITH PROPOFOL (N/A) ESOPHAGEAL DILATION WITH 54FR AND 56FR MALONEY (N/A) BIOPSY (Gastroesophageal Junction)  Patient Location: PACU  Anesthesia Type: MAC  Level of Consciousness: awake  Airway & Oxygen Therapy: Patient Spontanous Breathing. Non Rebreather  Post-op Assessment: Report given to PACU RN, Post -op Vital signs reviewed and stable and Patient moving all extremities  Post vital signs: Reviewed and stable  Complications: No apparent anesthesia complications

## 2014-12-03 NOTE — H&P (Signed)
[name] is an 64 y.o. male.   Chief Complaint: Patient is here for EGD and ED. HPI: Patient is 64 year old Caucasian male who was chronic GERD now presents with 6 month history of dysphagia to solids. He points to her mid and upper sternum area site of bolus obstruction. When this occurs she also has pain and burning until food bolus passes down. Patrici Ranks occurs in less than a minute. He also complains of daily heartburn despite taking PPI. He underwent esophageal gram one month ago revealing distal esophageal stricture and a hiatal hernia. He denies nausea vomiting melena or rectal bleeding. He also complains of right mid abdominal pain which shoots up when he bends or turns. He had abdominopelvic CT in July 2016 and no abnormality noted to account for his pain. He underwent EGD with dilation but year and a half ago. This study revealed small sliding hiatal hernia otherwise normal EGD. Esophageal dilation resulted in small mucosal disruption at cervical esophagus. Esophageal biopsy was negative for eosinophilic esophagitis.  Past Medical History  Diagnosis Date  . Back pain   . Essential hypertension   . Degenerative disk disease     a. Chronic back pain with ridiculopathy.  . Hyperlipidemia   . AAA (abdominal aortic aneurysm) ([place])     a. s/p EVAR 05/2010.  Marland Kitchen GERD (gastroesophageal reflux disease)   . Skin cancer   . 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 Tobacco abuse   . Hiatal hernia   . Pulmonary nodule     a. 15mm nodule seen on CT 09/2014 - f/u recommended 1 yr.  . CKD (chronic kidney disease), stage II   . Emphysema of lung [facility])     Past Surgical History  Procedure Laterality Date  . Appendectomy    . Spinal fusion      of neck and lower back  . Evar  06/20/10    for AAA repair   . Back surgery    . Esophagogastroduodenoscopy (egd) with propofol N/A 06/10/2013    Procedure: ESOPHAGOGASTRODUODENOSCOPY (EGD) WITH PROPOFOL;  Surgeon: Rogene Houston, MD;  Location: [facility];  Service: Endoscopy;  Laterality: N/A;  Venia Minks dilation N/A 06/10/2013    Procedure: MALONEY DILATION 52 fr, 39 fr;  Surgeon: Rogene Houston, MD;  Location: [facility];  Service: Endoscopy;  Laterality: N/A;  . Esophageal biopsy N/A 06/10/2013    Procedure: BIOPSY;  Surgeon: Rogene Houston, MD;  Location: [facility];  Service: Endoscopy;  Laterality: N/A;  . Abdominal aortic endovascular stent graft  2014    Family History  Problem Relation Age of Onset  . Atrial fibrillation Mother   . Heart disease Mother     Patient unclear of what kind  . Deep vein thrombosis Mother   . Hyperlipidemia Mother   . Hypertension Mother   . Stroke Father   . Diabetes Father   . Heart disease Father     Patient unclear of what kind  . Kidney disease Father   . Hyperlipidemia Father   . Hypertension Father   . Diabetes Sister   . Heart disease Sister     Patient unclear of what kind  . Brain cancer Sister   . Seizures Sister   . Cancer Paternal Grandmother   . Lung disease Neg Hx   . Deep vein thrombosis Other   . Pulmonary embolism Other    Social History:  reports that he has  quit smoking. His smoking use included Cigarettes. He started smoking about 48 years ago. He has a 48 pack-year smoking history. He has never used smokeless tobacco. He reports that he does not drink alcohol or use illicit drugs.  Allergies:  Allergies  Allergen Reactions  . Benazepril Hcl Other (See Comments)    Can't breathe  . Pantoprazole Sodium Other (See Comments)    Can't breathe    Medications Prior to Admission  Medication Sig Dispense Refill  . albuterol (PROVENTIL) (2.5 MG/3ML) 0.083% nebulizer solution Take 2.5 mg by nebulization every 6 (six) hours as needed. Shortness of breath    . amLODipine (NORVASC) 10 MG tablet Take 1 tablet (10 mg total) by mouth daily. 30 tablet 6  . aspirin 81 MG tablet Take 81 mg by mouth daily.     . diazepam (VALIUM) 5 MG tablet Take 5 mg by mouth  every 6 (six) hours as needed. For anxiety    . esomeprazole (NEXIUM) 20 MG capsule Take 1 capsule (20 mg total) by mouth 2 (two) times daily before a meal. 60 capsule 11  . gabapentin (NEURONTIN) 400 MG capsule Take 400 mg by mouth daily as needed (for neuropathy).     . Oxycodone HCl 20 MG TABS Take 20 mg by mouth every 4 (four) hours.      No results found for this or any previous visit (from the past 48 hour(s)). No results found.  ROS  Blood pressure 128/84, resp. rate 14, SpO2 99 %. Physical Exam  Constitutional: He appears well-developed and well-nourished.  HENT:  Mouth/Throat: Oropharynx is clear and moist.  Eyes: No scleral icterus.  Neck: No thyromegaly present.  Cardiovascular: Normal rate, regular rhythm and normal heart sounds.   No murmur heard. Respiratory: Effort normal and breath sounds normal.  GI:  Abdomen is symmetrical. Right paramedian scar. Abdomen is soft with mild midepigastric tenderness. No organomegaly or masses.  Musculoskeletal: He exhibits no edema.  Lymphadenopathy:    He has no cervical adenopathy.  Neurological: He is alert.  Skin: Skin is warm and dry.     Assessment/Plan Solid food dysphagia in a patient with chronic GERD. Abnormal barium study suggesting distal esophageal stricture. History of esophageal web. Chronic right-sided abdominal pain. EGD with ED.  Luba Matzen U 12/03/2014, 7:26 AM

## 2014-12-03 NOTE — Discharge Instructions (Signed)
Increase Esomeprazole or Nexium to 40 mg by mouth 30 minutes before breakfast and evening meal daily. Resume other medications as before. Resume usual diet. No driving for 24 hours. Physician will call with biopsy results.  Gastrointestinal Endoscopy, Care After Refer to this sheet in the next few weeks. These instructions provide you with information on caring for yourself after your procedure. Your caregiver may also give you more specific instructions. Your treatment has been planned according to current medical practices, but problems sometimes occur. Call your caregiver if you have any problems or questions after your procedure. HOME CARE INSTRUCTIONS  If you were given medicine to help you relax (sedative), do not drive, operate machinery, or sign important documents for 24 hours.  Avoid alcohol and hot or warm beverages for the first 24 hours after the procedure.  Only take over-the-counter or prescription medicines for pain, discomfort, or fever as directed by your caregiver. You may resume taking your normal medicines unless your caregiver tells you otherwise. Ask your caregiver when you may resume taking medicines that may cause bleeding, such as aspirin, clopidogrel, or warfarin.  You may return to your normal diet and activities on the day after your procedure, or as directed by your caregiver. Walking may help to reduce any bloated feeling in your abdomen.  Drink enough fluids to keep your urine clear or pale yellow.  You may gargle with salt water if you have a sore throat. SEEK IMMEDIATE MEDICAL CARE IF:  You have severe nausea or vomiting.  You have severe abdominal pain, abdominal cramps that last longer than 6 hours, or abdominal swelling (distention).  You have severe shoulder or back pain.  You have trouble swallowing.  You have shortness of breath, your breathing is shallow, or you are breathing faster than normal.  You have a fever or a rapid heartbeat.  You  vomit blood or material that looks like coffee grounds.  You have bloody, black, or tarry stools. MAKE SURE YOU:  Understand these instructions.  Will watch your condition.  Will get help right away if you are not doing well or get worse.   This information is not intended to replace advice given to you by your health care provider. Make sure you discuss any questions you have with your health care provider.   Document Released: 08/23/2003 Document Revised: 01/29/2014 Document Reviewed: 04/10/2011 Elsevier Interactive Patient Education [employer].

## 2014-12-03 NOTE — Anesthesia Postprocedure Evaluation (Signed)
  Anesthesia Post-op Note  Patient: [name]  Procedure(s) Performed: Procedure(s): ESOPHAGOGASTRODUODENOSCOPY (EGD) WITH PROPOFOL (N/A) ESOPHAGEAL DILATION WITH 54FR AND 56FR MALONEY (N/A) BIOPSY (Gastroesophageal Junction)  Patient Location: PACU  Anesthesia Type:MAC  Level of Consciousness: awake and patient cooperative  Airway and Oxygen Therapy: Patient Spontanous Breathing  Post-op Pain: none  Post-op Assessment: Post-op Vital signs reviewed, Patient's Cardiovascular Status Stable, Respiratory Function Stable and Patent Airway              Post-op Vital Signs: Reviewed and stable    Complications: No apparent anesthesia complications

## 2014-12-03 NOTE — Progress Notes (Signed)
Awake. Swallowing without difficulty.

## 2014-12-06 ENCOUNTER — Encounter (HOSPITAL_COMMUNITY): Payer: Self-pay | Admitting: Internal Medicine

## 2014-12-14 ENCOUNTER — Encounter: Payer: Self-pay | Admitting: Family

## 2014-12-17 ENCOUNTER — Ambulatory Visit: Payer: Medicaid Other | Admitting: Family

## 2014-12-17 ENCOUNTER — Other Ambulatory Visit (HOSPITAL_COMMUNITY): Payer: Medicaid Other

## 2014-12-20 ENCOUNTER — Encounter: Payer: Self-pay | Admitting: Family

## 2014-12-20 ENCOUNTER — Ambulatory Visit (INDEPENDENT_AMBULATORY_CARE_PROVIDER_SITE_OTHER): Payer: Medicaid Other | Admitting: Family

## 2014-12-20 ENCOUNTER — Ambulatory Visit (HOSPITAL_COMMUNITY)
Admission: RE | Admit: 2014-12-20 | Discharge: 2014-12-20 | Disposition: A | Discharge status: Home / Self Care | Payer: Medicaid Other | Source: Ambulatory Visit | Attending: Family | Admitting: Family

## 2014-12-20 VITALS — BP 121/76 | HR 59 | Temp 97.3°F | Resp 14 | Ht 68.0 in | Wt 190.0 lb

## 2014-12-20 DIAGNOSIS — Z87891 Personal history of nicotine dependence: Secondary | ICD-10-CM | POA: Diagnosis not present

## 2014-12-20 DIAGNOSIS — Z95828 Presence of other vascular implants and grafts: Secondary | ICD-10-CM | POA: Insufficient documentation

## 2014-12-20 DIAGNOSIS — Z8679 Personal history of other diseases of the circulatory system: Secondary | ICD-10-CM | POA: Diagnosis not present

## 2014-12-20 DIAGNOSIS — Z720 Tobacco use: Secondary | ICD-10-CM

## 2014-12-20 DIAGNOSIS — Z789 Other specified health status: Secondary | ICD-10-CM

## 2014-12-20 NOTE — Addendum Note (Signed)
Addended by: Dorthula Rue L on: 12/20/2014 11:06 AM   Modules accepted: Orders

## 2014-12-20 NOTE — Progress Notes (Signed)
[facility]  Established EVAR  History of Present Illness  [name] is a 64 y.o. (04-Nov-1950) male patient of Dr. Bridgett Larsson who presents for routine follow up s/p EVAR (Date: 06/21/10). Most recent EVAR duplex (Date: 05/30/12) demonstrates: no endoleak and shrinking sac size. Most recent CTA (Date: 12/05/2012) demonstrates: no endoleak and no sac. The patient has had chronic back and neck pain now with some radiculopathy. He attends [facility], Dr. Hardin Negus.  He denies any new back pain, reports abdominal pain that has been evaluated in 2016. He states his esophagus was dilated, but also states no etiology was found for his abdominal pain. He states he also has a hiatal hernia.  The patient denies any history of stroke or TIA, denies history of MI; he states he had a heart murmur as a child.  Pt Diabetic: No Pt smoker: smoker (1 ppd until October 2016, started vapor nicotine then, started at age 69 yrs)     Past Medical History  Diagnosis Date  . Back pain   . Essential hypertension   . Degenerative disk disease     a. Chronic back pain with ridiculopathy.  . Hyperlipidemia   . AAA (abdominal aortic aneurysm) ([place])     a. s/p EVAR 05/2010.  Marland Kitchen GERD (gastroesophageal reflux disease)   . Skin cancer   . 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 Tobacco abuse   . Hiatal hernia   . Pulmonary nodule     a. 21mm nodule seen on CT 09/2014 - f/u recommended 1 yr.  . CKD (chronic kidney disease), stage II   . Emphysema of lung [facility])    Past Surgical History  Procedure Laterality Date  . Appendectomy    . Spinal fusion      of neck and lower back  . Evar  06/20/10    for AAA repair   . Back surgery    . Esophagogastroduodenoscopy (egd) with propofol N/A 06/10/2013    Procedure: ESOPHAGOGASTRODUODENOSCOPY (EGD) WITH PROPOFOL;  Surgeon: Rogene Houston, MD;  Location: [facility];  Service: Endoscopy;  Laterality:  N/A;  Venia Minks dilation N/A 06/10/2013    Procedure: MALONEY DILATION 52 fr, 3 fr;  Surgeon: Rogene Houston, MD;  Location: [facility];  Service: Endoscopy;  Laterality: N/A;  . Esophageal biopsy N/A 06/10/2013    Procedure: BIOPSY;  Surgeon: Rogene Houston, MD;  Location: [facility];  Service: Endoscopy;  Laterality: N/A;  . Abdominal aortic endovascular stent graft  2014  . Esophagogastroduodenoscopy (egd) with propofol N/A 12/03/2014    Procedure: ESOPHAGOGASTRODUODENOSCOPY (EGD) WITH PROPOFOL;  Surgeon: Rogene Houston, MD;  Location: [facility];  Service: Endoscopy;  Laterality: N/A;  . Esophageal dilation N/A 12/03/2014    Procedure: ESOPHAGEAL DILATION WITH 54FR AND 56FR MALONEY;  Surgeon: Rogene Houston, MD;  Location: [facility];  Service: Endoscopy;  Laterality: N/A;  . Esophageal biopsy  12/03/2014    Procedure: BIOPSY (Gastroesophageal Junction);  Surgeon: Rogene Houston, MD;  Location: [facility];  Service: Endoscopy;;   Social History Social History  Substance Use Topics  . Smoking status: Former Smoker -- 1.00 packs/day for 48 years    Types: Cigarettes    Start date: 12/16/1965  . Smokeless tobacco: Never Used     Comment: stopped smoking a couple of weeks ago.  . Alcohol Use: No   Family History Family History  Problem Relation Age  of Onset  . Atrial fibrillation Mother   . Heart disease Mother     Patient unclear of what kind  . Deep vein thrombosis Mother   . Hyperlipidemia Mother   . Hypertension Mother   . Stroke Father   . Diabetes Father   . Heart disease Father     Patient unclear of what kind  . Kidney disease Father   . Hyperlipidemia Father   . Hypertension Father   . Diabetes Sister   . Heart disease Sister     Patient unclear of what kind  . Brain cancer Sister   . Seizures Sister   . Cancer Paternal Grandmother   . Lung disease Neg Hx   . Deep vein thrombosis Other   . Pulmonary embolism Other    Current Outpatient Prescriptions on File Prior to Visit   Medication Sig Dispense Refill  . albuterol (PROVENTIL) (2.5 MG/3ML) 0.083% nebulizer solution Take 2.5 mg by nebulization every 6 (six) hours as needed. Shortness of breath    . amLODipine (NORVASC) 10 MG tablet Take 1 tablet (10 mg total) by mouth daily. 30 tablet 6  . aspirin 81 MG tablet Take 81 mg by mouth daily.     . diazepam (VALIUM) 5 MG tablet Take 5 mg by mouth every 6 (six) hours as needed. For anxiety    . esomeprazole (NEXIUM) 40 MG capsule Take 1 capsule (40 mg total) by mouth 2 (two) times daily before a meal. 60 capsule 5  . gabapentin (NEURONTIN) 400 MG capsule Take 400 mg by mouth daily as needed (for neuropathy).     . Oxycodone HCl 20 MG TABS Take 20 mg by mouth every 4 (four) hours.     No current facility-administered medications on file prior to visit.   Allergies  Allergen Reactions  . Benazepril Hcl Other (See Comments)    Can't breathe  . Pantoprazole Sodium Other (See Comments)    Can't breathe     ROS: See HPI for pertinent positives and negatives.  Physical Examination  Filed Vitals:   12/20/14 0916  BP: 121/76  Pulse: 59  Temp: 97.3 F (36.3 C)  TempSrc: Oral  Resp: 14  Height: 5\' 8"  (1.727 m)  Weight: 190 lb (86.183 kg)  SpO2: 96%   Body mass index is 28.9 kg/(m^2).   General: A&O x 3, WDWN  Pulmonary: Sym exp, good air movt, CTAB, no rales or rhonchi, - wheezing  Cardiac: RRR, Nl S1, S2, no murmur appreciated   Vascular: Vessel Right Left  Radial Palpable Palpable  Brachial Palpable Palpable  Carotid Palpable, without bruit Palpable, without bruit  Aorta Not palpable N/A  Femoral Palpable Palpable  Popliteal Not palpable Not palpable  PT Palpable Palpable  DP Palpable Palpable   Gastrointestinal: soft, NTND, -G/R, - HSM, - masses palpated, - CVAT B, no palpable AAA  Musculoskeletal: M/S 5/5 throughout. extremities without ischemic changes , no palpable popliteal  aneurysms  Neurologic: Pain and light touch intact in extremities , Motor exam as listed above         CTA abd/pelvis (12/05/2012)  Based on Dr. Lianne Moris review of this patient's CTA, his endograft is in appropriate immediate infrarenal position with widely patent renal arteries and SMA and CA. There is no evidence of limb dysfunction. The AAA sac is nearly non-existent.   Non-Invasive Vascular Imaging  EVAR Duplex (Date: 12/20/2014) ABDOMINAL AORTA DUPLEX EVALUATION - POST ENDOVASCULAR REPAIR    INDICATION: Follow up endovascular repair  PREVIOUS INTERVENTION(S): Endovascular repair 06/20/2010    DUPLEX EXAM:      DIAMETER AP (cm) DIAMETER TRANSVERSE (cm) VELOCITIES (cm/sec)  Aorta 3.0 - 97  Right Common Iliac 1.2 .97 117  Left Common Iliac 1.0 0.99 93    Comparison Study       Date DIAMETER AP (cm) DIAMETER TRANSVERSE (cm)  12/11/2013 3.0 -     ADDITIONAL FINDINGS: CT 12/05/2012 revealed no endoleak and no sac    IMPRESSION: 1. Technically difficult exam due to excessive bowel gas. 2. The distal aorta,  status post repair measures 3.0 cms. 3. Iliac arteries not well visualized    Compared to the previous exam:  No significant change      Medical Decision Making  [name] is a 63 y.o. male who is s/p EVAR (Date: 06/21/10).   Given the near resolution of AAA sac in this patient, Dr. Bridgett Larsson doubts any benefit to repeat CTA, despite the late endoleak findings in the OVER trial.   I discussed with the patient the importance of surveillance of the endograft.  The next endograft duplex will be scheduled for 12 months.  The patient will follow up with [place] in 12 months with these studies.  I emphasized the importance of maximal medical management including strict control of blood pressure, blood glucose, and lipid levels, antiplatelet agents, obtaining regular exercise, and cessation of smoking.   Thank you for allowing [place] to participate in this patient's  care.  Clemon Chambers, RN, MSN, FNP-C [facility] [place] Office: [phone]  Clinic Physician: Trula Slade  12/20/2014, 8:42 AM

## 2014-12-20 NOTE — Patient Instructions (Addendum)
Smoking Cessation, Tips for Success If you are ready to quit smoking, congratulations! You have chosen to help yourself be healthier. Cigarettes bring nicotine, tar, carbon monoxide, and other irritants into your body. Your lungs, heart, and blood vessels will be able to work better without these poisons. There are many different ways to quit smoking. Nicotine gum, nicotine patches, a nicotine inhaler, or nicotine nasal spray can help with physical craving. Hypnosis, support groups, and medicines help break the habit of smoking. WHAT THINGS CAN I DO TO MAKE QUITTING EASIER?  Here are some tips to help you quit for good:  Pick a date when you will quit smoking completely. Tell all of your friends and family about your plan to quit on that date.  Do not try to slowly cut down on the number of cigarettes you are smoking. Pick a quit date and quit smoking completely starting on that day.  Throw away all cigarettes.   Clean and remove all ashtrays from your home, work, and car.  On a card, write down your reasons for quitting. Carry the card with you and read it when you get the urge to smoke.  Cleanse your body of nicotine. Drink enough water and fluids to keep your urine clear or pale yellow. Do this after quitting to flush the nicotine from your body.  Learn to predict your moods. Do not let a bad situation be your excuse to have a cigarette. Some situations in your life might tempt you into wanting a cigarette.  Never have "just one" cigarette. It leads to wanting another and another. Remind yourself of your decision to quit.  Change habits associated with smoking. If you smoked while driving or when feeling stressed, try other activities to replace smoking. Stand up when drinking your coffee. Brush your teeth after eating. Sit in a different chair when you read the paper. Avoid alcohol while trying to quit, and try to drink fewer caffeinated beverages. Alcohol and caffeine may urge you to  smoke.  Avoid foods and drinks that can trigger a desire to smoke, such as sugary or spicy foods and alcohol.  Ask people who smoke not to smoke around you.  Have something planned to do right after eating or having a cup of coffee. For example, plan to take a walk or exercise.  Try a relaxation exercise to calm you down and decrease your stress. Remember, you may be tense and nervous for the first 2 weeks after you quit, but this will pass.  Find new activities to keep your hands busy. Play with a pen, coin, or rubber band. Doodle or draw things on paper.  Brush your teeth right after eating. This will help cut down on the craving for the taste of tobacco after meals. You can also try mouthwash.   Use oral substitutes in place of cigarettes. Try using lemon drops, carrots, cinnamon sticks, or chewing gum. Keep them handy so they are available when you have the urge to smoke.  When you have the urge to smoke, try deep breathing.  Designate your home as a nonsmoking area.  If you are a heavy smoker, ask your health care provider about a prescription for nicotine chewing gum. It can ease your withdrawal from nicotine.  Reward yourself. Set aside the cigarette money you save and buy yourself something nice.  Look for support from others. Join a support group or smoking cessation program. Ask someone at home or at work to help you with your plan   to quit smoking.  Always ask yourself, "Do I need this cigarette or is this just a reflex?" Tell yourself, "Today, I choose not to smoke," or "I do not want to smoke." You are reminding yourself of your decision to quit.  Do not replace cigarette smoking with electronic cigarettes (commonly called e-cigarettes). The safety of e-cigarettes is unknown, and some may contain harmful chemicals.  If you relapse, do not give up! Plan ahead and think about what you will do the next time you get the urge to smoke. HOW WILL I FEEL WHEN I QUIT SMOKING? You  may have symptoms of withdrawal because your body is used to nicotine (the addictive substance in cigarettes). You may crave cigarettes, be irritable, feel very hungry, cough often, get headaches, or have difficulty concentrating. The withdrawal symptoms are only temporary. They are strongest when you first quit but will go away within 10-14 days. When withdrawal symptoms occur, stay in control. Think about your reasons for quitting. Remind yourself that these are signs that your body is healing and getting used to being without cigarettes. Remember that withdrawal symptoms are easier to treat than the major diseases that smoking can cause.  Even after the withdrawal is over, expect periodic urges to smoke. However, these cravings are generally short lived and will go away whether you smoke or not. Do not smoke! WHAT RESOURCES ARE AVAILABLE TO HELP ME QUIT SMOKING? Your health care provider can direct you to community resources or hospitals for support, which may include:  Group support.  Education.  Hypnosis.  Therapy.   This information is not intended to replace advice given to you by your health care provider. Make sure you discuss any questions you have with your health care provider.   Document Released: 10/07/2003 Document Revised: 01/29/2014 Document Reviewed: 06/26/2012 Elsevier Interactive Patient Education 2016 Elsevier Inc.    Steps to Quit Smoking  Smoking tobacco can be harmful to your health and can affect almost every organ in your body. Smoking puts you, and those around you, at risk for developing many serious chronic diseases. Quitting smoking is difficult, but it is one of the best things that you can do for your health. It is never too late to quit. WHAT ARE THE BENEFITS OF QUITTING SMOKING? When you quit smoking, you lower your risk of developing serious diseases and conditions, such as:  Lung cancer or lung disease, such as COPD.  Heart disease.  Stroke.  Heart  attack.  Infertility.  Osteoporosis and bone fractures. Additionally, symptoms such as coughing, wheezing, and shortness of breath may get better when you quit. You may also find that you get sick less often because your body is stronger at fighting off colds and infections. If you are pregnant, quitting smoking can help to reduce your chances of having a baby of low birth weight. HOW DO I GET READY TO QUIT? When you decide to quit smoking, create a plan to make sure that you are successful. Before you quit:  Pick a date to quit. Set a date within the next two weeks to give you time to prepare.  Write down the reasons why you are quitting. Keep this list in places where you will see it often, such as on your bathroom mirror or in your car or wallet.  Identify the people, places, things, and activities that make you want to smoke (triggers) and avoid them. Make sure to take these actions:  Throw away all cigarettes at home, at work,   and in your car.  Throw away smoking accessories, such as ashtrays and lighters.  Clean your car and make sure to empty the ashtray.  Clean your home, including curtains and carpets.  Tell your family, friends, and coworkers that you are quitting. Support from your loved ones can make quitting easier.  Talk with your health care provider about your options for quitting smoking.  Find out what treatment options are covered by your health insurance. WHAT STRATEGIES CAN I USE TO QUIT SMOKING?  Talk with your healthcare provider about different strategies to quit smoking. Some strategies include:  Quitting smoking altogether instead of gradually lessening how much you smoke over a period of time. Research shows that quitting "cold turkey" is more successful than gradually quitting.  Attending in-person counseling to help you build problem-solving skills. You are more likely to have success in quitting if you attend several counseling sessions. Even short  sessions of 10 minutes can be effective.  Finding resources and support systems that can help you to quit smoking and remain smoke-free after you quit. These resources are most helpful when you use them often. They can include:  Online chats with a counselor.  Telephone quitlines.  Printed self-help materials.  Support groups or group counseling.  Text messaging programs.  Mobile phone applications.  Taking medicines to help you quit smoking. (If you are pregnant or breastfeeding, talk with your health care provider first.) Some medicines contain nicotine and some do not. Both types of medicines help with cravings, but the medicines that include nicotine help to relieve withdrawal symptoms. Your health care provider may recommend:  Nicotine patches, gum, or lozenges.  Nicotine inhalers or sprays.  Non-nicotine medicine that is taken by mouth. Talk with your health care provider about combining strategies, such as taking medicines while you are also receiving in-person counseling. Using these two strategies together makes you more likely to succeed in quitting than if you used either strategy on its own. If you are pregnant or breastfeeding, talk with your health care provider about finding counseling or other support strategies to quit smoking. Do not take medicine to help you quit smoking unless told to do so by your health care provider. WHAT THINGS CAN I DO TO MAKE IT EASIER TO QUIT? Quitting smoking might feel overwhelming at first, but there is a lot that you can do to make it easier. Take these important actions:  Reach out to your family and friends and ask that they support and encourage you during this time. Call telephone quitlines, reach out to support groups, or work with a counselor for support.  Ask people who smoke to avoid smoking around you.  Avoid places that trigger you to smoke, such as bars, parties, or smoke-break areas at work.  Spend time around people who do  not smoke.  Lessen stress in your life, because stress can be a smoking trigger for some people. To lessen stress, try:  Exercising regularly.  Deep-breathing exercises.  Yoga.  Meditating.  Performing a body scan. This involves closing your eyes, scanning your body from head to toe, and noticing which parts of your body are particularly tense. Purposefully relax the muscles in those areas.  Download or purchase mobile phone or tablet apps (applications) that can help you stick to your quit plan by providing reminders, tips, and encouragement. There are many free apps, such as QuitGuide from the CDC (Centers for Disease Control and Prevention). You can find other support for quitting smoking (smoking   cessation) through smokefree.gov and other websites. HOW WILL I FEEL WHEN I QUIT SMOKING? Within the first 24 hours of quitting smoking, you may start to feel some withdrawal symptoms. These symptoms are usually most noticeable 2-3 days after quitting, but they usually do not last beyond 2-3 weeks. Changes or symptoms that you might experience include:  Mood swings.  Restlessness, anxiety, or irritation.  Difficulty concentrating.  Dizziness.  Strong cravings for sugary foods in addition to nicotine.  Mild weight gain.  Constipation.  Nausea.  Coughing or a sore throat.  Changes in how your medicines work in your body.  A depressed mood.  Difficulty sleeping (insomnia). After the first 2-3 weeks of quitting, you may start to notice more positive results, such as:  Improved sense of smell and taste.  Decreased coughing and sore throat.  Slower heart rate.  Lower blood pressure.  Clearer skin.  The ability to breathe more easily.  Fewer sick days. Quitting smoking is very challenging for most people. Do not get discouraged if you are not successful the first time. Some people need to make many attempts to quit before they achieve long-term success. Do your best to  stick to your quit plan, and talk with your health care provider if you have any questions or concerns.   This information is not intended to replace advice given to you by your health care provider. Make sure you discuss any questions you have with your health care provider.   Document Released: 01/02/2001 Document Revised: 05/25/2014 Document Reviewed: 05/25/2014 Elsevier Interactive Patient Education 2016 Elsevier Inc.  

## 2014-12-29 ENCOUNTER — Telehealth: Payer: Self-pay | Admitting: Cardiovascular Disease

## 2014-12-29 NOTE — Telephone Encounter (Signed)
New message  Pt c/o medication issue:  1. Name of Medication: New BP med Amlodipine  2. How are you currently taking this medication (dosage and times per day)? 10 mg Once a day  3. Are you having a reaction (difficulty breathing--STAT)? Ankles and feet are swelling very bad  Pt c/o swelling: STAT is pt has developed SOB within 24 hours  1. How long have you been experiencing swelling? For about 2 weeks now   2. Where is the swelling located? Feet and Ankles now traveling up the leg  3.  Are you currently taking a "fluid pill"? no   4.  Are you currently SOB? No   5.  Have you traveled recently? No    Comments:   4. What is your medication issue? Pt also states that when he takes the losartan he cannot breath. Please call back to discuss

## 2014-12-29 NOTE — Telephone Encounter (Signed)
I spoke with the pt and he complains of swelling in his feet, ankles and legs. The pt's Amlodipine was increased to 10mg  daily on 11/17/2014. The pt's BP has been running 120-135/70-80.  I asked the pt if he is taking losartan (per note from [place]) and he said that he is not taking this medication.  He thought she asked if he had any allergies. I advised the pt to continue taking Amlodipine and I will forward this message to Dr Burt Knack to review and make further medication recommendations.

## 2014-12-29 NOTE — Telephone Encounter (Signed)
May be better to go back on losartan HCT at previous dose and discontinue amlodipine.   [name] 12/29/2014 11:05 PM

## 2014-12-30 MED ORDER — LOSARTAN POTASSIUM-HCTZ 100-12.5 MG PO TABS: ORAL_TABLET | ORAL | Status: DC

## 2014-12-30 NOTE — Telephone Encounter (Signed)
I spoke with the pt and clarified that he is not allergic to Losartan.  The pt said he was confused yesterday when they asked him about his allergies and he is not allergic to losartan (Benazepril causes SOB). The pt was previously taking Losartan HCT 100/12.5mg  one-half tablet daily.  I advised the pt to resume this medication and stop amlodipine. The pt will monitor his BP and contact the office with any other questions or concerns.  The pt has a pending appointment on 01/18/15 with our office.

## 2014-12-31 ENCOUNTER — Telehealth: Payer: Self-pay | Admitting: Pulmonary Disease

## 2014-12-31 ENCOUNTER — Ambulatory Visit (INDEPENDENT_AMBULATORY_CARE_PROVIDER_SITE_OTHER): Payer: Medicaid Other | Admitting: Pulmonary Disease

## 2014-12-31 ENCOUNTER — Encounter: Payer: Self-pay | Admitting: Pulmonary Disease

## 2014-12-31 VITALS — BP 130/70 | HR 70 | Ht 68.0 in | Wt 192.0 lb

## 2014-12-31 DIAGNOSIS — J438 Other emphysema: Secondary | ICD-10-CM

## 2014-12-31 DIAGNOSIS — F17200 Nicotine dependence, unspecified, uncomplicated: Secondary | ICD-10-CM | POA: Diagnosis not present

## 2014-12-31 DIAGNOSIS — J439 Emphysema, unspecified: Secondary | ICD-10-CM

## 2014-12-31 DIAGNOSIS — R911 Solitary pulmonary nodule: Secondary | ICD-10-CM

## 2014-12-31 DIAGNOSIS — R0600 Dyspnea, unspecified: Secondary | ICD-10-CM

## 2014-12-31 DIAGNOSIS — K219 Gastro-esophageal reflux disease without esophagitis: Secondary | ICD-10-CM | POA: Diagnosis not present

## 2014-12-31 LAB — PULMONARY FUNCTION TEST
DL/VA % pred: 77 %
DL/VA: 3.47 ml/min/mmHg/L
DLCO UNC % PRED: 61 %
DLCO UNC: 18.11 ml/min/mmHg
FEF 25-75 Post: 3.57 L/sec
FEF 25-75 Pre: 2.74 L/sec
FEF2575-%CHANGE-POST: 30 %
FEF2575-%PRED-POST: 132 %
FEF2575-%Pred-Pre: 101 %
FEV1-%CHANGE-POST: 8 %
FEV1-%PRED-POST: 85 %
FEV1-%Pred-Pre: 78 %
FEV1-Post: 2.82 L
FEV1-Pre: 2.59 L
FEV1FVC-%CHANGE-POST: 0 %
FEV1FVC-%PRED-PRE: 109 %
FEV6-%Change-Post: 9 %
FEV6-%PRED-POST: 82 %
FEV6-%Pred-Pre: 75 %
FEV6-PRE: 3.15 L
FEV6-Post: 3.44 L
FEV6FVC-%Pred-Post: 105 %
FEV6FVC-%Pred-Pre: 105 %
FVC-%CHANGE-POST: 9 %
FVC-%PRED-POST: 78 %
FVC-%PRED-PRE: 72 %
FVC-PRE: 3.15 L
FVC-Post: 3.44 L
POST FEV1/FVC RATIO: 82 %
PRE FEV6/FVC RATIO: 100 %
Post FEV6/FVC ratio: 100 %
Pre FEV1/FVC ratio: 82 %
RV % PRED: 67 %
RV: 1.47 L
TLC % pred: 80 %
TLC: 5.34 L

## 2014-12-31 NOTE — Patient Instructions (Signed)
1. Continue taking Nexium as prescribed. 2. We will schedule you for a CT scan of your chest in March to make sure the nodule is not enlarging in your lung. 3. We will repeat breathing test at her next appointment. 4. Please call me if you have any new breathing problems before your next appointment otherwise I will see you back in 3-4 months.

## 2014-12-31 NOTE — Telephone Encounter (Signed)
IMAGING BARIUM SWALLOW (11/02/14):  Small hiatal hernia. No GERD elicited. 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 No mass or ulcer seen. Mild esophageal dysmotility & mild cricopharyngeus muscle dysfunction c/w chronic GERD.  PATHOLOGY EGD BIOPSY (12/03/14):  Barrett's Esophagus without dysplasia.  LABS 10/29/14 ALPHA-1 AT:  MM (145)

## 2014-12-31 NOTE — Addendum Note (Signed)
Addended by: Inge Rise on: 12/31/2014 01:26 PM   Modules accepted: Orders

## 2014-12-31 NOTE — Progress Notes (Signed)
PFT reviewed

## 2014-12-31 NOTE — Progress Notes (Addendum)
Subjective:    Patient ID: [name], male    DOB: [birth year], 64 y.o.   MRN: [id number]  C.C.:  Follow-up for Emphysema, Left Lower Lobe Nodule, GERD, & Ongoing Tobacco Use.  HPI Emphysema: Alpha-1 antitrypsin testing negative. Likely secondary tobacco use. He reports only occasional coughing secondary to sinus congestion. Denies any wheezing. Dyspnea with simultaneous talking & walking.  Left Lower Lobe Nodule: 3 mm. First seen on CT imaging September 2016. Plan for repeat CT scan March 2017.   GERD: Underwent EGD since last appointment revealing Barrett's esophagus without dysplasia. He only had dysphagia once or twice since last appointment. Continues to take Nexium bid.  Ongoing Tobacco Use:  He continues to use the Vapor Cigarette. Has only smoked 1-2 since last visit.   Review of Systems No chest pain or pressure. No fever, chills, or sweats.  Allergies  Allergen Reactions  . Benazepril Hcl Other (See Comments)    Can't breathe  . Pantoprazole Sodium Other (See Comments)    Can't breathe    Current Outpatient Prescriptions on File Prior to Visit  Medication Sig Dispense Refill  . albuterol (PROVENTIL) (2.5 MG/3ML) 0.083% nebulizer solution Take 2.5 mg by nebulization every 6 (six) hours as needed. Shortness of breath    . aspirin 81 MG tablet Take 81 mg by mouth daily.     . diazepam (VALIUM) 5 MG tablet Take 5 mg by mouth every 6 (six) hours as needed. For anxiety    . esomeprazole (NEXIUM) 40 MG capsule Take 1 capsule (40 mg total) by mouth 2 (two) times daily before a meal. 60 capsule 5  . gabapentin (NEURONTIN) 400 MG capsule Take 400 mg by mouth daily as needed (for neuropathy).     . losartan-hydrochlorothiazide (HYZAAR) 100-12.5 MG tablet Take one-half tablet daily by mouth 30 tablet 3  . Oxycodone HCl 20 MG TABS Take 20 mg by mouth every 4 (four) hours.     No current facility-administered medications on file prior to visit.    Past Medical History    Diagnosis Date  . Back pain   . Essential hypertension   . Degenerative disk disease     a. Chronic back pain with ridiculopathy.  . Hyperlipidemia   . AAA (abdominal aortic aneurysm) ([place])     a. s/p EVAR 05/2010.  Marland Kitchen GERD (gastroesophageal reflux disease)   . Skin cancer   . 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 Tobacco abuse   . Hiatal hernia   . Pulmonary nodule     a. 51mm nodule seen on CT 09/2014 - f/u recommended 1 yr.  . CKD (chronic kidney disease), stage II   . Emphysema of lung [facility])     Past Surgical History  Procedure Laterality Date  . Appendectomy    . Spinal fusion      of neck and lower back  . Evar  06/20/10    for AAA repair   . Back surgery    . Esophagogastroduodenoscopy (egd) with propofol N/A 06/10/2013    Procedure: ESOPHAGOGASTRODUODENOSCOPY (EGD) WITH PROPOFOL;  Surgeon: Rogene Houston, MD;  Location: [facility];  Service: Endoscopy;  Laterality: N/A;  Venia Minks dilation N/A 06/10/2013    Procedure: MALONEY DILATION 52 fr, 37 fr;  Surgeon: Rogene Houston, MD;  Location: [facility];  Service: Endoscopy;  Laterality: N/A;  . Esophageal biopsy N/A 06/10/2013    Procedure: BIOPSY;  Surgeon: Mechele Dawley  Laural Golden, MD;  Location: [facility];  Service: Endoscopy;  Laterality: N/A;  . Abdominal aortic endovascular stent graft  2014  . Esophagogastroduodenoscopy (egd) with propofol N/A 12/03/2014    Procedure: ESOPHAGOGASTRODUODENOSCOPY (EGD) WITH PROPOFOL;  Surgeon: Rogene Houston, MD;  Location: [facility];  Service: Endoscopy;  Laterality: N/A;  . Esophageal dilation N/A 12/03/2014    Procedure: ESOPHAGEAL DILATION WITH 54FR AND 56FR MALONEY;  Surgeon: Rogene Houston, MD;  Location: [facility];  Service: Endoscopy;  Laterality: N/A;  . Esophageal biopsy  12/03/2014    Procedure: BIOPSY (Gastroesophageal Junction);  Surgeon: Rogene Houston, MD;  Location: [facility];  Service: Endoscopy;;    Family History  Problem Relation Age of Onset  . Atrial  fibrillation Mother   . Heart disease Mother     Patient unclear of what kind  . Deep vein thrombosis Mother   . Hyperlipidemia Mother   . Hypertension Mother   . Stroke Father   . Diabetes Father   . Heart disease Father     Patient unclear of what kind  . Kidney disease Father   . Hyperlipidemia Father   . Hypertension Father   . Diabetes Sister   . Heart disease Sister     Patient unclear of what kind  . Brain cancer Sister   . Seizures Sister   . Cancer Paternal Grandmother   . Lung disease Neg Hx   . Deep vein thrombosis Other   . Pulmonary embolism Other     Social History   Social History  . Marital Status: Married    Spouse Name: N/A  . Number of Children: N/A  . Years of Education: N/A   Social History Main Topics  . Smoking status: Former Smoker -- 1.00 packs/day for 48 years    Types: Cigarettes    Start date: 12/16/1965    Quit date: 11/19/2014  . Smokeless tobacco: Never Used     Comment: stopped smoking a couple of weeks ago.  . Alcohol Use: No  . Drug Use: No  . Sexual Activity: Not Asked   Other Topics Concern  . None   Social History Narrative   Originally from [place], [place]. Always lived in [place]. Travel to the [place] in March 2016. Has traveled to [place], [place], [place], & [place]. 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 No known asbestos exposure. Has 2 parakeets currently. Remotely raised parakeets, love birds, & cockatiels in a different house. No known mold or hot tub exposure.       Objective:   Physical Exam BP 130/70 mmHg  Pulse 70  Ht 5\' 8"  (1.727 m)  Wt 192 lb (87.091 kg)  BMI 29.20 kg/m2  SpO2 98% General:  Awake. Alert. No acute distress. Granddaughters at bedside..  Integument:  Warm & dry. No rash on exposed skin. No bruising. Lymphatics:  No appreciated cervical or supraclavicular lymphadenoapthy. HEENT:  Moist mucus membranes. No oral ulcers. No  scleral injection or icterus. Edentulous.  Cardiovascular:  Regular rate. No edema. No appreciable JVD.  Pulmonary:  Good aeration & clear to auscultation bilaterally. Symmetric chest wall expansion. No accessory muscle use. Abdomen: Soft. Normal bowel sounds. Protuberant. Grossly nontender. No hernia appreciated. Musculoskeletal:  Normal bulk and tone. Hand grip strength 5/5 bilaterally. No joint deformity or effusion appreciated.  PFT 12/31/14: FVC 3.15 L (72%) FEV1 2.59 L (78%) FEV1/FVC 0.82 FEF 25-75 2.74 L (101%)  no bronchodilator response TLC 5.34 L (80%) RV 67% ERV 69% DLCO uncorrected 61%  6MWT 12/31/14:  Walked 326 meters / Baseline Sat 96% on RA / Nadir Sat 96% on RA  IMAGING BARIUM SWALLOW (11/02/14): Small hiatal hernia. No GERD elicited. 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 No mass or ulcer seen. Mild esophageal dysmotility & mild cricopharyngeus muscle dysfunction c/w chronic GERD.  CTA CHEST 10/13/14 (previously reviewed by me): 3 mm left lower lobe nodule noted. No other nodule or opacity appreciated. Trace bilateral pleural effusions no appreciated pleural thickening. Dependent atelectasis noted bilaterally. Upper lobe predominant emphysematous changes with subpleural bleb noted within the left upper lobe apex. No pericardial effusion. No pulmonary embolus. Small hiatal hernia noted.  PATHOLOGY EGD BIOPSY (12/03/14): Barrett's Esophagus without dysplasia.  LABS 10/29/14 ALPHA-1 AT: MM (145)  10/20/14 BMP: 140/4.3/103/24/14/1.27/93/9.2    Assessment & Plan:  64 year old Caucasian male with incidentally found left lower lobe nodule on CTA of the chest measuring 58mm. Continues to have no significant symptoms from underlying emphysema. Reviewed spirometry with the patient during the visit today which shows no evidence for COPD. Discussed the need for continued tobacco abstinence. Plan for repeat CT imaging in March to continue to follow this  lung nodule. I instructed the patient contact my office for any new breathing problems before his next appointment.  1. Emphysema: Likely secondary to tobacco use. Screening for alpha-1 antitrypsin negative. No COPD on spirometry. Repeat spirometry with DLCO next appointment. 2. Left lower lobe lung nodule: Repeat CT scan of the chest without contrast March 2017. 3. GERD: Continuing Nexium by mouth twice a day. 4. Tobacco use: Patient continuing to use vapor cigarettes to abstain from tobacco use. 5. Health maintenance: Declines influenza vaccine. 6. Follow-up: Patient to return to clinic in 3-4 months or sooner if needed.

## 2014-12-31 NOTE — Progress Notes (Signed)
PFT done today. 

## 2015-01-17 NOTE — Progress Notes (Signed)
Cardiology Office Note    Date:  01/18/2015   ID:  [name], DOB 06-Jan-1951, MRN [id number]  PCP:  Collene Mares, PA-C  Cardiologist:  Dr. Sherren Mocha   Electrophysiologist:  n/a  Chief Complaint  Patient presents with  . Follow-up    BP    History of Present Illness:  [name] is a 64 y.o. male with a hx of AAA s/p EVAR 5/12, HTN, HL, tobacco abuse, emphysema, GERD.  He was evaluated by Dr. Sherren Mocha and Melina Copa, PA-C in 9/16 for dyspnea.  Nuclear stress test was low risk.  He has been followed by pulmonology for emphysema and recent finding of pulmonary nodules. He recently called into the office with complaints of lower extremity edema. Amlodipine was stopped and he was placed back on Hyzaar. He returns for follow-up.  He is doing well.  Edema is resolved.  Breathing is stable.  Denies chest pain, orthopnea, PND, syncope.  He is still smoking cigarettes.    Past Medical History  Diagnosis Date  . Back pain   . Essential hypertension   . Degenerative disk disease     a. Chronic back pain with ridiculopathy.  . Hyperlipidemia   . AAA (abdominal aortic aneurysm) ([place])     a. s/p EVAR 05/2010.  Marland Kitchen GERD (gastroesophageal reflux disease)   . Skin cancer   . 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 Tobacco abuse   . Hiatal hernia   . Pulmonary nodule     a. 73mm nodule seen on CT 09/2014 - f/u recommended 1 yr.  . CKD (chronic kidney disease), stage II   . Emphysema of lung [facility])     Past Surgical History  Procedure Laterality Date  . Appendectomy    . Spinal fusion      of neck and lower back  . Evar  06/20/10    for AAA repair   . Back surgery    . Esophagogastroduodenoscopy (egd) with propofol N/A 06/10/2013    Procedure: ESOPHAGOGASTRODUODENOSCOPY (EGD) WITH PROPOFOL;  Surgeon: Rogene Houston, MD;  Location: [facility];  Service: Endoscopy;  Laterality: N/A;  Venia Minks dilation N/A 06/10/2013    Procedure: MALONEY  DILATION 52 fr, 57 fr;  Surgeon: Rogene Houston, MD;  Location: [facility];  Service: Endoscopy;  Laterality: N/A;  . Esophageal biopsy N/A 06/10/2013    Procedure: BIOPSY;  Surgeon: Rogene Houston, MD;  Location: [facility];  Service: Endoscopy;  Laterality: N/A;  . Abdominal aortic endovascular stent graft  2014  . Esophagogastroduodenoscopy (egd) with propofol N/A 12/03/2014    Procedure: ESOPHAGOGASTRODUODENOSCOPY (EGD) WITH PROPOFOL;  Surgeon: Rogene Houston, MD;  Location: [facility];  Service: Endoscopy;  Laterality: N/A;  . Esophageal dilation N/A 12/03/2014    Procedure: ESOPHAGEAL DILATION WITH 54FR AND 56FR MALONEY;  Surgeon: Rogene Houston, MD;  Location: [facility];  Service: Endoscopy;  Laterality: N/A;  . Esophageal biopsy  12/03/2014    Procedure: BIOPSY (Gastroesophageal Junction);  Surgeon: Rogene Houston, MD;  Location: [facility];  Service: Endoscopy;;    Current Outpatient Prescriptions  Medication Sig Dispense Refill  . albuterol (PROVENTIL) (2.5 MG/3ML) 0.083% nebulizer solution Take 2.5 mg by nebulization every 6 (six) hours as needed. Shortness of breath    . aspirin 81 MG tablet Take 81 mg by mouth daily.     . diazepam (VALIUM) 5 MG tablet Take 5 mg by mouth  every 6 (six) hours as needed. For anxiety    . esomeprazole (NEXIUM) 40 MG capsule Take 1 capsule (40 mg total) by mouth 2 (two) times daily before a meal. 60 capsule 5  . gabapentin (NEURONTIN) 400 MG capsule Take 400 mg by mouth daily as needed (for neuropathy).     . losartan-hydrochlorothiazide (HYZAAR) 100-12.5 MG tablet Take one-half tablet daily by mouth (Patient taking differently: Take by mouth daily. Take one-half tablet daily by mouth) 30 tablet 3  . Oxycodone HCl 20 MG TABS Take 20 mg by mouth every 4 (four) hours.     No current facility-administered medications for this visit.    Allergies:   Benazepril hcl; Pantoprazole sodium; and Amlodipine   Social History   Social History  . Marital Status: Married     Spouse Name: N/A  . Number of Children: N/A  . Years of Education: N/A   Social History Main Topics  . Smoking status: Current Some Day Smoker -- 1.00 packs/day for 48 years    Types: Cigarettes    Start date: 12/16/1965    Last Attempt to Quit: 11/19/2014  . Smokeless tobacco: Never Used  . Alcohol Use: No  . Drug Use: No  . Sexual Activity: Not Asked   Other Topics Concern  . None   Social History Narrative   Originally from [place], [place]. Always lived in [place]. Travel to the [place] in March 2016. Has traveled to [place], [place], [place], & [place]. 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 No known asbestos exposure. Has 2 parakeets currently. Remotely raised parakeets, love birds, & cockatiels in a different house. No known mold or hot tub exposure.      Family History:  The patient's family history includes Atrial fibrillation in his mother; Brain cancer in his sister; Cancer in his paternal grandmother; Deep vein thrombosis in his mother and other; Diabetes in his father and sister; Heart disease in his father, mother, and sister; Hyperlipidemia in his father and mother; Hypertension in his father and mother; Kidney disease in his father; Pulmonary embolism in his other; Seizures in his sister; Stroke in his father. There is no history of Lung disease.   ROS:   Please see the history of present illness.    Review of Systems  Cardiovascular: Positive for leg swelling.  Musculoskeletal: Positive for joint pain.  Neurological: Positive for loss of balance.  Psychiatric/Behavioral: The patient is nervous/anxious.   All other systems reviewed and are negative.   PHYSICAL EXAM:   VS:  BP 102/68 mmHg  Pulse 55  Ht 5\' 8"  (1.727 m)  Wt 192 lb 6.4 oz (87.272 kg)  BMI 29.26 kg/m2  SpO2 96%   GEN: Well nourished, well developed, in no acute distress HEENT: normal Neck: no JVD, no masses Cardiac: Normal S1/S2,  RRR; no murmurs, rubs, or gallops, no edema    Respiratory:  clear to auscultation bilaterally; no wheezing, rhonchi or rales GI: soft, nontender, nondistended  MS: no deformity or atrophy Skin: warm and dry, no rash Neuro:  No focal deficits Psych: Alert and oriented x 3, normal affect  Wt Readings from Last 3 Encounters:  01/18/15 192 lb 6.4 oz (87.272 kg)  12/31/14 192 lb (87.091 kg)  12/20/14 190 lb (86.183 kg)      Studies/Labs Reviewed:   EKG:  EKG is ordered today.  The ekg ordered today demonstrates sinus bradycardia, HR  55, normal axis, septal Q waves, QTc 401 ms, no change from prior tracing  Recent Labs: 11/29/2014: BUN 10; Creatinine, Ser 1.31*; Hemoglobin 14.2; Platelets 254; Potassium 4.2; Sodium 137   Recent Lipid Panel    Component Value Date/Time   CHOL 168 09/06/2012 0914   TRIG 172* 09/06/2012 0914   HDL 27* 09/06/2012 0914   CHOLHDL 6.2 09/06/2012 0914   VLDL 34 09/06/2012 0914   LDLCALC 107* 09/06/2012 0914    Additional studies/ records that were reviewed today include:   Myoview 10/25/14  Nuclear stress EF: 70%.  There was no ST segment deviation noted during stress.  The study is normal. There is no evidence of ischemia. There is some interference from bowel uptake.  This is a low risk study.   ASSESSMENT:    1. Essential hypertension   2. Tobacco abuse    PLAN:  In order of problems listed above:  1. HTN - BP is controlled.  He cannot tolerate Amlodipine due to edema. Edema is resolved off Amlodipine.  Continue Hyzaar.  Check FU BMET today.  I recommend that he FU with primary care for his BP in the future.  He can see cardiology as needed in the future.  He agrees with this plan.   2. Tobacco abuse - He has emphysema and a known lung nodule.  I recommended cessation to him today.     Medication Adjustments/Labs and Tests Ordered: Current medicines are reviewed at length with the patient today.  Concerns regarding medicines are  outlined above.  Medication changes, Labs and Tests ordered today are listed below. Patient Instructions  Medication Instructions:  Your physician recommends that you continue on your current medications as directed. Please refer to the Current Medication list given to you today.  Labwork: Your physician recommends that you have lab work today: bmet  Testing/Procedures: -None  Follow-Up: As needed  Any Other Special Instructions Will Be Listed Below (If Applicable).  If you need a refill on your cardiac medications before your next appointment, please call your pharmacy.        Signed, Richardson Dopp, PA-C  01/18/2015 4:20 PM    [facility] [address], [place], [place]  [address] Phone: [phone]; Fax: [phone]

## 2015-01-18 ENCOUNTER — Ambulatory Visit (INDEPENDENT_AMBULATORY_CARE_PROVIDER_SITE_OTHER): Payer: Medicaid Other | Admitting: Physician Assistant

## 2015-01-18 ENCOUNTER — Encounter: Payer: Self-pay | Admitting: Physician Assistant

## 2015-01-18 VITALS — BP 102/68 | HR 55 | Ht 68.0 in | Wt 192.4 lb

## 2015-01-18 DIAGNOSIS — Z720 Tobacco use: Secondary | ICD-10-CM

## 2015-01-18 DIAGNOSIS — I10 Essential (primary) hypertension: Secondary | ICD-10-CM | POA: Diagnosis not present

## 2015-01-18 LAB — BASIC METABOLIC PANEL
BUN: 11 mg/dL (ref 7–25)
CHLORIDE: 99 mmol/L (ref 98–110)
CO2: 28 mmol/L (ref 20–31)
CREATININE: 1.27 mg/dL — AB (ref 0.70–1.25)
Calcium: 9.1 mg/dL (ref 8.6–10.3)
Glucose, Bld: 94 mg/dL (ref 65–99)
POTASSIUM: 3.6 mmol/L (ref 3.5–5.3)
SODIUM: 136 mmol/L (ref 135–146)

## 2015-01-18 NOTE — Patient Instructions (Addendum)
Medication Instructions:  Your physician recommends that you continue on your current medications as directed. Please refer to the Current Medication list given to you today.  Labwork: Your physician recommends that you have lab work today: bmet  Testing/Procedures: -None  Follow-Up: As needed  Any Other Special Instructions Will Be Listed Below (If Applicable).  If you need a refill on your cardiac medications before your next appointment, please call your pharmacy.

## 2015-01-19 ENCOUNTER — Telehealth: Payer: Self-pay | Admitting: *Deleted

## 2015-01-19 NOTE — Telephone Encounter (Signed)
Pt notified of lab results by phone today. Pt states he was told he has CKD. I said yes that I see that on his chart. Pt stated no one has ever really told him about it. I advised pt to f/u PCP for further questions to be answered about CKD.

## 2015-03-23 ENCOUNTER — Inpatient Hospital Stay: Admission: RE | Admit: 2015-03-23 | Payer: Medicaid Other | Source: Ambulatory Visit

## 2015-03-25 ENCOUNTER — Inpatient Hospital Stay: Admission: RE | Admit: 2015-03-25 | Payer: Medicaid Other | Source: Ambulatory Visit

## 2015-03-30 ENCOUNTER — Telehealth: Payer: Self-pay | Admitting: Physician Assistant

## 2015-03-30 NOTE — Telephone Encounter (Signed)
Spoke with patient and informed him I do not personally have this info but I can have someone in Billing call him or he can call his insurance. His CT is scheduled here for 3/9, ordered by outside provider.

## 2015-03-30 NOTE — Telephone Encounter (Signed)
New message   Pt wants to know if the CT has been approved by insurance

## 2015-03-30 NOTE — Telephone Encounter (Signed)
Forwarded info to pre-cert team regarding need for prior approval.

## 2015-03-30 NOTE — Telephone Encounter (Signed)
I call patient to inform him that medicaid AUTH# has been obtained  (under referral entry tab )  in Epic . I  also went over date & time of appt with patient

## 2015-03-31 ENCOUNTER — Ambulatory Visit (INDEPENDENT_AMBULATORY_CARE_PROVIDER_SITE_OTHER)
Admission: RE | Admit: 2015-03-31 | Discharge: 2015-03-31 | Disposition: A | Discharge status: Home / Self Care | Payer: Medicaid Other | Source: Ambulatory Visit | Attending: Pulmonary Disease | Admitting: Pulmonary Disease

## 2015-03-31 DIAGNOSIS — R911 Solitary pulmonary nodule: Secondary | ICD-10-CM

## 2015-04-01 ENCOUNTER — Other Ambulatory Visit: Payer: Self-pay | Admitting: Pulmonary Disease

## 2015-04-01 DIAGNOSIS — R911 Solitary pulmonary nodule: Secondary | ICD-10-CM

## 2015-04-26 ENCOUNTER — Encounter (INDEPENDENT_AMBULATORY_CARE_PROVIDER_SITE_OTHER): Payer: Self-pay | Admitting: *Deleted

## 2015-05-17 ENCOUNTER — Encounter: Payer: Self-pay | Admitting: Pulmonary Disease

## 2015-05-17 ENCOUNTER — Ambulatory Visit (INDEPENDENT_AMBULATORY_CARE_PROVIDER_SITE_OTHER): Payer: Medicaid Other | Admitting: Pulmonary Disease

## 2015-05-17 VITALS — BP 114/68 | HR 60 | Ht 68.0 in | Wt 187.0 lb

## 2015-05-17 DIAGNOSIS — J438 Other emphysema: Secondary | ICD-10-CM

## 2015-05-17 DIAGNOSIS — K219 Gastro-esophageal reflux disease without esophagitis: Secondary | ICD-10-CM | POA: Diagnosis not present

## 2015-05-17 DIAGNOSIS — R911 Solitary pulmonary nodule: Secondary | ICD-10-CM

## 2015-05-17 DIAGNOSIS — IMO0001 Reserved for inherently not codable concepts without codable children: Secondary | ICD-10-CM

## 2015-05-17 DIAGNOSIS — F17200 Nicotine dependence, unspecified, uncomplicated: Secondary | ICD-10-CM

## 2015-05-17 LAB — PULMONARY FUNCTION TEST
DL/VA % PRED: 74 %
DL/VA: 3.36 ml/min/mmHg/L
DLCO COR: 17.24 ml/min/mmHg
DLCO cor % pred: 58 %
DLCO unc % pred: 55 %
DLCO unc: 16.46 ml/min/mmHg
FEF 25-75 PRE: 2.19 L/s
FEF2575-%Pred-Pre: 82 %
FEV1-%Pred-Pre: 83 %
FEV1-PRE: 2.74 L
FEV1FVC-%Pred-Pre: 100 %
FEV6-%PRED-PRE: 86 %
FEV6-PRE: 3.59 L
FEV6FVC-%PRED-PRE: 104 %
FVC-%PRED-PRE: 82 %
FVC-Pre: 3.62 L
Pre FEV1/FVC ratio: 76 %
Pre FEV6/FVC Ratio: 99 %

## 2015-05-17 NOTE — Progress Notes (Signed)
PFT done today. 05/17/2015  

## 2015-05-17 NOTE — Patient Instructions (Addendum)
   Call me if you have any new breathing problems before your next appointment  Try using a nicotine patch daily with nicotine gum intermittently for cravings to help you quit smoking  I will see you back in 6 months after your CT scan or sooner if needed  TESTS ORDERED: 1. CT CHEST W/O September 2017

## 2015-05-17 NOTE — Progress Notes (Signed)
Subjective:    Patient ID: [name], male    DOB: [birth year], 65 y.o.   MRN: [id number]  C.C.:  Follow-up for Emphysema, Left Lower Lobe Nodule, GERD, & Ongoing Tobacco Use.  HPI Emphysema: Alpha-1 antitrypsin testing negative. Likely secondary tobacco use. Denies any coughing or wheezing. Denies any new dyspnea.   Left Lower Lobe Nodule: 3 mm. First seen on CT imaging September 2016. Stable on repeat CT imaging March 2017.  GERD:  Denies any reflux or dyspepsia. Currently taking Nexium. No nausea or vomiting.  Ongoing Tobacco Use:  He reports he relapsed and is now smoking 1ppd. Previously was using a Vapor cigarette.   Review of Systems denies any chest pain or pressure. Denies any subjective fever, chills, or sweats.  Allergies  Allergen Reactions  . Benazepril Hcl Other (See Comments)    Can't breathe  . Pantoprazole Sodium Other (See Comments)    Can't breathe  . Amlodipine Swelling    Current Outpatient Prescriptions on File Prior to Visit  Medication Sig Dispense Refill  . albuterol (PROVENTIL) (2.5 MG/3ML) 0.083% nebulizer solution Take 2.5 mg by nebulization every 6 (six) hours as needed. Shortness of breath    . aspirin 81 MG tablet Take 81 mg by mouth daily.     . diazepam (VALIUM) 5 MG tablet Take 5 mg by mouth every 6 (six) hours as needed. For anxiety    . esomeprazole (NEXIUM) 40 MG capsule Take 1 capsule (40 mg total) by mouth 2 (two) times daily before a meal. 60 capsule 5  . gabapentin (NEURONTIN) 400 MG capsule Take 400 mg by mouth daily as needed (for neuropathy).     . losartan-hydrochlorothiazide (HYZAAR) 100-12.5 MG tablet Take one-half tablet daily by mouth (Patient taking differently: Take by mouth daily. Take one-half tablet daily by mouth) 30 tablet 3  . Oxycodone HCl 20 MG TABS Take 20 mg by mouth every 4 (four) hours.     No current facility-administered medications on file prior to visit.    Past Medical History  Diagnosis Date  . Back  pain   . Essential hypertension   . Degenerative disk disease     a. Chronic back pain with ridiculopathy.  . Hyperlipidemia   . AAA (abdominal aortic aneurysm) ([place])     a. s/p EVAR 05/2010.  Marland Kitchen GERD (gastroesophageal reflux disease)   . Skin cancer   . 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 Tobacco abuse   . Hiatal hernia   . Pulmonary nodule     a. 21mm nodule seen on CT 09/2014 - f/u recommended 1 yr.  . CKD (chronic kidney disease), stage II   . Emphysema of lung [facility])     Past Surgical History  Procedure Laterality Date  . Appendectomy    . Spinal fusion      of neck and lower back  . Evar  06/20/10    for AAA repair   . Back surgery    . Esophagogastroduodenoscopy (egd) with propofol N/A 06/10/2013    Procedure: ESOPHAGOGASTRODUODENOSCOPY (EGD) WITH PROPOFOL;  Surgeon: Rogene Houston, MD;  Location: [facility];  Service: Endoscopy;  Laterality: N/A;  Venia Minks dilation N/A 06/10/2013    Procedure: MALONEY DILATION 52 fr, 90 fr;  Surgeon: Rogene Houston, MD;  Location: [facility];  Service: Endoscopy;  Laterality: N/A;  . Biopsy N/A 06/10/2013    Procedure: BIOPSY;  Surgeon: Rogene Houston, MD;  Location: [facility];  Service: Endoscopy;  Laterality: N/A;  . Abdominal aortic endovascular stent graft  2014  . Esophagogastroduodenoscopy (egd) with propofol N/A 12/03/2014    Procedure: ESOPHAGOGASTRODUODENOSCOPY (EGD) WITH PROPOFOL;  Surgeon: Rogene Houston, MD;  Location: [facility];  Service: Endoscopy;  Laterality: N/A;  . Esophageal dilation N/A 12/03/2014    Procedure: ESOPHAGEAL DILATION WITH 54FR AND 56FR MALONEY;  Surgeon: Rogene Houston, MD;  [facility]: [facility];  Service: Endoscopy;  Laterality: N/A;  . Biopsy  12/03/2014    Procedure: BIOPSY (Gastroesophageal Junction);  Surgeon: Rogene Houston, MD;  Location: [facility];  Service: Endoscopy;;    Family History  Problem Relation Age of Onset  . Atrial fibrillation Mother   . Heart disease Mother      Patient unclear of what kind  . Deep vein thrombosis Mother   . Hyperlipidemia Mother   . Hypertension Mother   . Stroke Father   . Diabetes Father   . Heart disease Father     Patient unclear of what kind  . Kidney disease Father   . Hyperlipidemia Father   . Hypertension Father   . Diabetes Sister   . Heart disease Sister     Patient unclear of what kind  . Brain cancer Sister   . Seizures Sister   . Cancer Paternal Grandmother   . Lung disease Neg Hx   . Deep vein thrombosis Other   . Pulmonary embolism Other     Social History   Social History  . Marital Status: Married    Spouse Name: N/A  . Number of Children: N/A  . Years of Education: N/A   Social History Main Topics  . Smoking status: Current Some Day Smoker -- 1.00 packs/day for 48 years    Types: Cigarettes    Start date: 12/16/1965  . Smokeless tobacco: Never Used  . Alcohol Use: No  . Drug Use: No  . Sexual Activity: Not Asked   Other Topics Concern  . None   Social History Narrative   Originally from [place], [place]. Always lived in [place]. Travel to the [place] in March 2016. Has traveled to [place], [place], [place], & [place]. 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 No known asbestos exposure. Has 2 parakeets currently. Remotely raised parakeets, love birds, & cockatiels in a different house. No known mold or hot tub exposure.       Objective:   Physical Exam BP 114/68 mmHg  Pulse 60  Ht 5\' 8"  (1.727 m)  Wt 187 lb (84.823 kg)  BMI 28.44 kg/m2  SpO2 97% General:  Awake. Alert. No distress.  Integument:  Warm & dry. No rash on exposed skin.  Lymphatics:  No appreciated cervical or supraclavicular lymphadenoapthy. HEENT:  Moist mucus membranes. No oral ulcers. No scleral injection.  Cardiovascular:  Regular rate. No edema. No appreciable JVD.  Pulmonary:  Clear bilaterally to auscultation. Normal work of breathing on room air.  HEENT sentences. Abdomen: Soft. Normal bowel sounds. Nontender. Musculoskeletal:  Normal bulk and tone. No joint deformity or effusion appreciated.  PFT 05/17/15: FVC 3.16 L (82%) FEV1 2.74 L (83%) FEV1/FVC 0.76 FEF 25-75 2.19 L (82%)  DLCO corrected 58% 12/31/14: FVC 3.15 L (72%) FEV1 2.59 L (78%) FEV1/FVC 0.82 FEF 25-75 2.74 L (101%) no bronchodilator response TLC 5.34 L (80%) RV 67% ERV 69% DLCO uncorrected 61%  6MWT 12/31/14:  Walked 326 meters / Baseline Sat 96% on RA / Nadir Sat 96% on RA  IMAGING CT CHEST W/O 03/31/15 (personally reviewed by me): 3 mm left upper lobe nodule unchanged. No new nodule appreciated. No pleural effusion or thickening. No pathologic mediastinal adenopathy. No pericardial effusion.  BARIUM SWALLOW (11/02/14): Small hiatal hernia. No GERD elicited. 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 No mass or ulcer seen. Mild esophageal dysmotility & mild cricopharyngeus muscle dysfunction c/w chronic GERD.  CTA CHEST 10/13/14 (previously reviewed by me): 3 mm left lower lobe nodule noted. No other nodule or opacity appreciated. Trace bilateral pleural effusions no appreciated pleural thickening. Dependent atelectasis noted bilaterally. Upper lobe predominant emphysematous changes with subpleural bleb noted within the left upper lobe apex. No pericardial effusion. No pulmonary embolus. Small hiatal hernia noted.  PATHOLOGY EGD BIOPSY (12/03/14): Barrett's Esophagus without dysplasia.  LABS 10/29/14 ALPHA-1 AT: MM (145)  10/20/14 BMP: 140/4.3/103/24/14/1.27/93/9.2    Assessment & Plan:  65 year old Caucasian male with incidentally found left lower lobe nodule on CTA of the chest measuring 32mm. Patient has restarted using tobacco. I spent over 3 minutes counseling the patient on the need for complete tobacco cessation to prevent  worsening of his underlying emphysema. His spirometry today remains stable to mildly improved. His reflux seems to be well-controlled as well. I reviewed his chest CT scan with him today and we will plan for repeat imaging in 6 months. I instructed the patient to contact my office if he had any new breathing problems before his next appointment.  1. Emphysema: Likely secondary to tobacco use. Monitoring pulmonary function testing on a yearly basis unless patient develops symptoms. 2. Left lower lobe lung nodule: Repeat CT scan of the chest without contrast September 2017. 3. GERD: Well-controlled on Nexium. No changes.  4. Tobacco use: Patient counseled for over 3 minutes and need for complete tobacco cessation. Recommended trying a combination of nicotine patches and gum.  5. Health maintenance: Previously declined influenza vaccine. 6. Follow-up: Patient to return to clinic 6 months or sooner if needed.  Sonia Baller Ashok Cordia, M.D. [facility] Pager:  [phone] After 3pm or if no response, call [phone] 2:31 PM 05/17/2015

## 2015-07-15 ENCOUNTER — Other Ambulatory Visit (INDEPENDENT_AMBULATORY_CARE_PROVIDER_SITE_OTHER): Payer: Self-pay | Admitting: Internal Medicine

## 2015-08-03 ENCOUNTER — Ambulatory Visit (INDEPENDENT_AMBULATORY_CARE_PROVIDER_SITE_OTHER): Payer: Medicaid Other | Admitting: Internal Medicine

## 2015-08-03 ENCOUNTER — Ambulatory Visit (INDEPENDENT_AMBULATORY_CARE_PROVIDER_SITE_OTHER)
Admission: RE | Admit: 2015-08-03 | Discharge: 2015-08-03 | Disposition: A | Discharge status: Home / Self Care | Payer: Medicaid Other | Source: Ambulatory Visit | Attending: Internal Medicine | Admitting: Internal Medicine

## 2015-08-03 ENCOUNTER — Encounter: Payer: Self-pay | Admitting: Internal Medicine

## 2015-08-03 VITALS — BP 110/68 | HR 71 | Ht 69.0 in | Wt 185.0 lb

## 2015-08-03 DIAGNOSIS — J45901 Unspecified asthma with (acute) exacerbation: Secondary | ICD-10-CM | POA: Insufficient documentation

## 2015-08-03 DIAGNOSIS — F17210 Nicotine dependence, cigarettes, uncomplicated: Secondary | ICD-10-CM

## 2015-08-03 DIAGNOSIS — Z720 Tobacco use: Secondary | ICD-10-CM | POA: Diagnosis not present

## 2015-08-03 MED ORDER — AMOXICILLIN-POT CLAVULANATE 875-125 MG PO TABS: 1.0000 | ORAL_TABLET | Freq: Two times a day (BID) | ORAL | Status: DC

## 2015-08-03 MED ORDER — PREDNISONE 10 MG PO TABS: ORAL_TABLET | ORAL | Status: DC

## 2015-08-03 NOTE — Progress Notes (Signed)
Subjective:    Patient ID: [name], male    DOB: 11-Nov-1950    MRN: [id number]  C.C.:  Follow-up for Emphysema, Left Lower Lobe Nodule, GERD, & Ongoing Tobacco Use.  HPI Emphysema: Alpha-1 antitrypsin testing negative. Likely secondary tobacco use. Denies any coughing or wheezing. Denies any new dyspnea.   Left Lower Lobe Nodule: 3 mm. First seen on CT imaging September 2016. Stable on repeat CT imaging March 2017.  GERD:  Denies any reflux or dyspepsia. Currently taking Nexium. No nausea or vomiting.  Ongoing Tobacco Use:  He reports he relapsed and is now smoking 1ppd. Previously was using a Vapor cigarette.     rec  Call me if you have any new breathing problems before your next appointment  Try using a nicotine patch daily with nicotine gum intermittently for cravings to help you quit smoking  I will see you back in 6 months after your CT scan or sooner if needed  TESTS ORDERED: 1. CT CHEST W/O September 2017   08/03/2015 acute extended ov/Wert re: ?aeAB on no maint rx/ still smoking/ nl baseline pfts  Chief Complaint  Patient presents with  . Acute Visit    Pt c/o increased cough for the past 3-4 wks- occ prod with blood streaked green sputum. He also c/o increased SOB which he relates to humid weather. He is using albuterol neb 2-3 x per day.    baseline no need for saba at all now using 3 x daily/ more cough at hs and doe x > slow walk flat surface  No obvious day to day or daytime variability or assoc  cp or chest tightness, subjective wheeze or overt sinus or hb symptoms. No unusual exp hx or h/o childhood pna/ asthma or knowledge of premature birth.  Sleeping ok without nocturnal  or early am exacerbation  of respiratory  c/o's or need for noct saba. Also denies any obvious fluctuation of symptoms with weather or environmental changes or other aggravating or alleviating factors except as outlined above   Current Medications, Allergies, Complete Past Medical  History, Past Surgical History, Family History, and Social History were reviewed in [employer] record.  ROS  The following are not active complaints unless bolded sore throat, dysphagia, dental problems, itching, sneezing,  nasal congestion or excess/ purulent secretions, ear ache,   fever, chills, sweats, unintended wt loss, classically pleuritic or exertional cp, hemoptysis,  orthopnea pnd or leg swelling, presyncope, palpitations, abdominal pain, anorexia, nausea, vomiting, diarrhea  or change in bowel or bladder habits, change in stools or urine, dysuria,hematuria,  rash, arthralgias, visual complaints, headache, numbness, weakness or ataxia or problems with walking or coordination,  change in mood/affect or memory.              Objective:   Physical Exam   amb wm prominent pseudowheeze 2 h p last neb  Wt Readings from Last 3 Encounters:  08/03/15 185 lb (83.915 kg)  05/17/15 187 lb (84.823 kg)  01/18/15 192 lb 6.4 oz (87.272 kg)    Vital signs reviewed   General:  Awake. Alert. No distress.  Integument:  Warm & dry. No rash on exposed skin.  Lymphatics:  No appreciated cervical or supraclavicular lymphadenoapthy. HEENT:  Moist mucus membranes. No oral ulcers. No scleral injection.  Cardiovascular:  Regular rate. No edema. No appreciable JVD.  Pulmonary: very min  insp and exp rhonchi but prominent pseudowheeze partially clears with PLM  Normal work of breathing on  room air.  Abdomen: Soft. Normal bowel sounds. Nontender. Musculoskeletal:  Normal bulk and tone. No joint deformity or effusion appreciated.      CXR PA and Lateral:   08/03/2015 :    I personally reviewed images and agree with radiology impression as follows:   Hyperinflation consistent with reactive airway disease-emphysema. There is no pneumonia nor CHF.  Aortic atherosclerosis.           Assessment & Plan:

## 2015-08-03 NOTE — Patient Instructions (Addendum)
Change nexium 40 mg Take 30- 60 min before your first and last meals of the day   Augmentin 875 mg take one pill twice daily  X 10 days - take at breakfast and supper with large glass of water.  It would help reduce the usual side effects (diarrhea and yeast infections) if you ate cultured yogurt at lunch.   Prednisone 10 mg take  4 each am x 2 days,   2 each am x 2 days,  1 each am x 2 days and stop   GERD (REFLUX)  is an extremely common cause of respiratory symptoms just like yours , many times with no obvious heartburn at all.    It can be treated with medication, but also with lifestyle changes including elevation of the head of your bed (ideally with 6 inch  bed blocks),  Smoking cessation, avoidance of late meals, excessive alcohol, and avoid fatty foods, chocolate, peppermint, colas, red wine, and acidic juices such as orange juice.  NO MINT OR MENTHOL PRODUCTS SO NO COUGH DROPS  USE SUGARLESS CANDY INSTEAD (Jolley ranchers or Stover's or Life Savers) or even ice chips will also do - the key is to swallow to prevent all throat clearing. NO OIL BASED VITAMINS - use powdered substitutes.   Please remember to go to the  x-ray department downstairs for your tests - we will call you with the results when they are available.  Whenever coughing take neurontin 400 twice daily     Keep follow up with Dr Ashok Cordia

## 2015-08-03 NOTE — Progress Notes (Signed)
Quick Note:  LMTCB ______ 

## 2015-08-04 NOTE — Assessment & Plan Note (Addendum)
PFT's  02/16/2015 nl flows/vols s rx  prior to study with DLCO  55/58 % corrects to 74 % for alv volume    Not clear how much is AB vs UACS but ddx is the same: DDX of  difficult airways management almost all start with A and  include Adherence, Ace Inhibitors, Acid Reflux, Active Sinus Disease, Alpha 1 Antitripsin deficiency, Anxiety masquerading as Airways dz,  ABPA,  Allergy(esp in young), Aspiration (esp in elderly), Adverse effects of meds,  Active smokers, A bunch of PE's (a small clot burden can't cause this syndrome unless there is already severe underlying pulm or vascular dz with poor reserve) plus two Bs  = Bronchiectasis and Beta blocker use..and one C= CHF   Adherence is always the initial "prime suspect" and is a multilayered concern that requires a "trust but verify" approach in every patient - starting with knowing how to use medications, especially inhalers, correctly, keeping up with refills and understanding the fundamental difference between maintenance and prns vs those medications only taken for a very short course and then stopped and not refilled. - Already has neurontin but not using regularly and may be of benefit in UACS > suggested he use it when coughing   Active smoking greatest concern - (see separate a/p)   ? Acid (or non-acid) GERD > always difficult to exclude as up to 75% of pts in some series report no assoc GI/ Heartburn symptoms and he has Barretts but not timing his PPI optimally > rec max (24h)  acid suppression and diet restrictions/ reviewed and instructions given in writing.   ? Active sinus dz >Augmentin 875 mg take one pill twice daily  X 10 days - take at breakfast and supper with large glass of water.  It would help reduce the usual side effects (diarrhea and yeast infections) if you ate cultured yogurt at lunch.   ? Allergy > Prednisone 10 mg take  4 each am x 2 days,   2 each am x 2 days,  1 each am x 2 days and stop     I had an extended discussion  with the patient reviewing all relevant studies completed to date and  lasting 25 minutes of a 40  minute acute office visit    Each maintenance medication was reviewed in detail including most importantly the difference between maintenance and prns and under what circumstances the prns are to be triggered using an action plan format that is not reflected in the computer generated alphabetically organized AVS.    Please see instructions for details which were reviewed in writing and the patient given a copy highlighting the part that I personally wrote and discussed at today's ov.

## 2015-08-04 NOTE — Assessment & Plan Note (Signed)
>   3 min Discussed the risks and costs (both direct and indirect)  of smoking relative to the benefits of quitting but patient unwilling to commit at this point to a specific quit date.    Although I don't endorse regular use of e cigs/ many pts find them helpful; however, I emphasized they should be considered a "one-way bridge" off all tobacco products.  

## 2015-09-27 ENCOUNTER — Inpatient Hospital Stay: Admission: RE | Admit: 2015-09-27 | Payer: Medicaid Other | Source: Ambulatory Visit

## 2015-10-18 ENCOUNTER — Ambulatory Visit (INDEPENDENT_AMBULATORY_CARE_PROVIDER_SITE_OTHER)
Admission: RE | Admit: 2015-10-18 | Discharge: 2015-10-18 | Disposition: A | Discharge status: Home / Self Care | Payer: Medicaid Other | Source: Ambulatory Visit | Attending: Pulmonary Disease | Admitting: Pulmonary Disease

## 2015-10-18 DIAGNOSIS — R911 Solitary pulmonary nodule: Secondary | ICD-10-CM | POA: Diagnosis not present

## 2015-10-18 DIAGNOSIS — IMO0001 Reserved for inherently not codable concepts without codable children: Secondary | ICD-10-CM

## 2015-11-03 ENCOUNTER — Encounter (INDEPENDENT_AMBULATORY_CARE_PROVIDER_SITE_OTHER): Payer: Self-pay | Admitting: Internal Medicine

## 2015-11-09 ENCOUNTER — Other Ambulatory Visit: Payer: Medicaid Other

## 2015-11-09 ENCOUNTER — Ambulatory Visit (INDEPENDENT_AMBULATORY_CARE_PROVIDER_SITE_OTHER): Payer: Medicaid Other | Admitting: Pulmonary Disease

## 2015-11-09 ENCOUNTER — Encounter: Payer: Self-pay | Admitting: Pulmonary Disease

## 2015-11-09 VITALS — BP 128/76 | HR 73 | Ht 68.0 in | Wt 181.4 lb

## 2015-11-09 DIAGNOSIS — J439 Emphysema, unspecified: Secondary | ICD-10-CM | POA: Diagnosis not present

## 2015-11-09 DIAGNOSIS — K219 Gastro-esophageal reflux disease without esophagitis: Secondary | ICD-10-CM

## 2015-11-09 DIAGNOSIS — R911 Solitary pulmonary nodule: Secondary | ICD-10-CM | POA: Diagnosis not present

## 2015-11-09 DIAGNOSIS — F17200 Nicotine dependence, unspecified, uncomplicated: Secondary | ICD-10-CM | POA: Diagnosis not present

## 2015-11-09 DIAGNOSIS — R05 Cough: Secondary | ICD-10-CM | POA: Diagnosis not present

## 2015-11-09 DIAGNOSIS — R059 Cough, unspecified: Secondary | ICD-10-CM | POA: Insufficient documentation

## 2015-11-09 DIAGNOSIS — IMO0001 Reserved for inherently not codable concepts without codable children: Secondary | ICD-10-CM

## 2015-11-09 MED ORDER — BUDESONIDE-FORMOTEROL FUMARATE 160-4.5 MCG/ACT IN AERO: 2.0000 | INHALATION_SPRAY | Freq: Two times a day (BID) | RESPIRATORY_TRACT | 0 refills | Status: DC

## 2015-11-09 MED ORDER — AEROCHAMBER MV MISC: 0 refills | Status: DC

## 2015-11-09 NOTE — Patient Instructions (Addendum)
   Use the Symbicort - inhale two puffs twice daily with your spacer.  Make sure you rinse, gargle, brush your tongue, and spit after the inhaler to keep from getting thrush.  Call me for a prescription if this seems to help your cough & breathing and we will send in a prescription.  Let me know if this doesn't help.  I will see you back in 3 months or sooner if needed.   TESTS ORDERED: 1. Sputum culture for AFB, Fungus, & Bacteria

## 2015-11-09 NOTE — Progress Notes (Signed)
Subjective:    Patient ID: [name], male    DOB: Mar 06, 1950, 65 y.o.   MRN: [id number]  C.C.:  Follow-up for Emphysema, Left Upper Lobe Nodule, GERD, & Tobacco Use Disorder.  HPI Emphysema: Alpha-1 antitrypsin testing negative. Likely secondary tobacco use. Reports he has had a cough more frequently recently. He reports it is productive of a mucus that is "green and yellow at times". Reports he does produce some blood-tinged mucus at times as well. He is having intermittent wheezing. Previously treated with Prednisone for his cough with some relief.   Left Upper Lobe Nodule: 3 mm. First seen on CT imaging September 2016. Stable on repeat CT imaging March 2017. Mostly calcified on CT scan this month.   GERD:  On Nexium. No reflux or dyspepsia. No morning brash water taste.  Tobacco Use Disorder:  Previously using Vapor cigarette. He reports he smokes from 1/2ppd to 1ppd.   Review of Systems Denies any fever or chills. He reports he does have intermittent pain and discomfort in his right upper abdomen that is worse with picking things up and with yelling. No sore throat. Is having sinus congestion & drainage.   Allergies  Allergen Reactions  . Benazepril Hcl Other (See Comments)    Can't breathe  . Pantoprazole Sodium Other (See Comments)    Can't breathe  . Amlodipine Swelling    Current Outpatient Prescriptions on File Prior to Visit  Medication Sig Dispense Refill  . albuterol (PROVENTIL) (2.5 MG/3ML) 0.083% nebulizer solution Take 2.5 mg by nebulization every 6 (six) hours as needed. Shortness of breath    . aspirin 81 MG tablet Take 81 mg by mouth daily.     . diazepam (VALIUM) 5 MG tablet Take 5 mg by mouth every 6 (six) hours as needed. For anxiety    . gabapentin (NEURONTIN) 400 MG capsule Take 400 mg by mouth daily as needed (for neuropathy).     . losartan-hydrochlorothiazide (HYZAAR) 100-12.5 MG tablet Take one-half tablet daily by mouth (Patient taking differently:  Take by mouth daily. Take one-half tablet daily by mouth) 30 tablet 3  . NEXIUM 40 MG capsule TAKE 1 CAPSULE (40 MG TOTAL) BY MOUTH 2 (TWO) TIMES DAILY BEFORE A MEAL. 60 capsule 5  . Oxycodone HCl 20 MG TABS Take 20 mg by mouth every 4 (four) hours.     No current facility-administered medications on file prior to visit.     Past Medical History:  Diagnosis Date  . AAA (abdominal aortic aneurysm) ([place])    a. s/p EVAR 05/2010.  . Back pain   . CKD (chronic kidney disease), stage II   . Degenerative disk disease    a. Chronic back pain with ridiculopathy.  . Emphysema of lung ([place])   . Essential hypertension   . GERD (gastroesophageal reflux disease)   . Hiatal hernia   . Hyperlipidemia   . 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 Pulmonary nodule    a. 38mm nodule seen on CT 09/2014 - f/u recommended 1 yr.  . Skin cancer   . Tobacco abuse     Past Surgical History:  Procedure Laterality Date  . ABDOMINAL AORTIC ENDOVASCULAR STENT GRAFT  2014  . APPENDECTOMY    . BACK SURGERY    . BIOPSY N/A 06/10/2013   Procedure: BIOPSY;  Surgeon: Rogene Houston, MD;  Location: [facility];  Service: Endoscopy;  Laterality: N/A;  . BIOPSY  12/03/2014  Procedure: BIOPSY (Gastroesophageal Junction);  Surgeon: Rogene Houston, MD;  Location: [facility];  Service: Endoscopy;;  . ESOPHAGEAL DILATION N/A 12/03/2014   Procedure: ESOPHAGEAL DILATION WITH 54FR AND 56FR MALONEY;  Surgeon: Rogene Houston, MD;  Location: [facility];  Service: Endoscopy;  Laterality: N/A;  . ESOPHAGOGASTRODUODENOSCOPY (EGD) WITH PROPOFOL N/A 06/10/2013   Procedure: ESOPHAGOGASTRODUODENOSCOPY (EGD) WITH PROPOFOL;  Surgeon: Rogene Houston, MD;  Location: [facility];  Service: Endoscopy;  Laterality: N/A;  . ESOPHAGOGASTRODUODENOSCOPY (EGD) WITH PROPOFOL N/A 12/03/2014   Procedure: ESOPHAGOGASTRODUODENOSCOPY (EGD) WITH PROPOFOL;  Surgeon: Rogene Houston, MD;  Location: [facility];  Service: Endoscopy;  Laterality: N/A;   . EVAR  06/20/10   for AAA repair   . MALONEY DILATION N/A 06/10/2013   Procedure: MALONEY DILATION 52 fr, 30 fr;  Surgeon: Rogene Houston, MD;  Location: [facility];  Service: Endoscopy;  Laterality: N/A;  . SPINAL FUSION     of neck and lower back    Family History  Problem Relation Age of Onset  . Atrial fibrillation Mother   . Heart disease Mother     Patient unclear of what kind  . Deep vein thrombosis Mother   . Hyperlipidemia Mother   . Hypertension Mother   . Stroke Father   . Diabetes Father   . Heart disease Father     Patient unclear of what kind  . Kidney disease Father   . Hyperlipidemia Father   . Hypertension Father   . Diabetes Sister   . Heart disease Sister     Patient unclear of what kind  . Brain cancer Sister   . Seizures Sister   . Cancer Paternal Grandmother   . Lung disease Neg Hx   . Deep vein thrombosis Other   . Pulmonary embolism Other     Social History   Social History  . Marital status: Married    Spouse name: N/A  . Number of children: N/A  . Years of education: N/A   Social History Main Topics  . Smoking status: Current Every Day Smoker    Packs/day: 1.00    Years: 48.00    Types: Cigarettes    Start date: 12/16/1965  . Smokeless tobacco: Never Used  . Alcohol use No  . Drug use: No  . Sexual activity: Not Asked   Other Topics Concern  . None   Social History Narrative   Originally from [place], [place]. Always lived in [place]. Travel to the [place] in March 2016. Has traveled to [place], [place], [place], & [place]. 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 No known asbestos exposure. Has 2 parakeets currently. Remotely raised parakeets, love birds, & cockatiels in a different house. No known mold or hot tub exposure.       Objective:   Physical Exam BP 128/76 (BP Location: Left Arm, Cuff Size: Normal)   Pulse 73   Ht 5\' 8"  (1.727 m)   Wt 181 lb 6.4 oz  (82.3 kg)   SpO2 98%   BMI 27.58 kg/m  General:  Awake. Alert. Comfortable. Grand-daughter with him today.  Integument:  Warm & dry. No rash on exposed skin.  Lymphatics:  No appreciated cervical or supraclavicular lymphadenoapthy. HEENT:  Moist mucus membranes. Minimal nasal turbinate swelling. No oral ulcers. Cardiovascular:  Regular rate. No edema. Normal S1 & S2. Pulmonary:  Good aeration bilaterally. Speaking in complete sentences. Clear on auscultation.  Abdomen: Soft. Normal bowel sounds. Nontender. Musculoskeletal:  Normal bulk and tone. No joint deformity or effusion appreciated.  PFT 05/17/15: FVC 3.16 L (82%) FEV1 2.74 L (83%) FEV1/FVC 0.76 FEF 25-75 2.19 L (82%)                                                                                                               DLCO corrected 58% 12/31/14: FVC 3.15 L (72%) FEV1 2.59 L (78%) FEV1/FVC 0.82 FEF 25-75 2.74 L (101%) no bronchodilator response TLC 5.34 L (80%) RV 67% ERV 69% DLCO uncorrected 61%  6MWT 12/31/14:  Walked 326 meters / Baseline Sat 96% on RA / Nadir Sat 96% on RA  IMAGING CT CHEST W/O 10/18/15 (personally reviewed by me):  34mm left upper lobe nodule unchanged and now mostly calcified. No new nodule or opacity. Borderline pre-carinal adenopathy. No pleural effusion or thickening. No pericardial effusion.   CT CHEST W/O 03/31/15 (previously reviewed by me): 3 mm left upper lobe nodule unchanged. No new nodule appreciated. No pleural effusion or thickening. No pathologic mediastinal adenopathy. No pericardial effusion.  BARIUM SWALLOW (11/02/14): Small hiatal hernia. No GERD elicited. 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 No mass or ulcer seen. Mild esophageal dysmotility & mild cricopharyngeus muscle dysfunction c/w chronic GERD.  CTA CHEST 10/13/14 (previously reviewed by me): 3 mm left upper lobe nodule noted. No other nodule or opacity appreciated. Trace bilateral pleural effusions  no appreciated pleural thickening. Dependent atelectasis noted bilaterally. Upper lobe predominant emphysematous changes with subpleural bleb noted within the left upper lobe apex. No pericardial effusion. No pulmonary embolus. Small hiatal hernia noted.  PATHOLOGY EGD BIOPSY (12/03/14): Barrett's Esophagus without dysplasia.  LABS 10/29/14 ALPHA-1 AT: MM (145)  10/20/14 BMP: 140/4.3/103/24/14/1.27/93/9.2    Assessment & Plan:  65 y.o. Caucasian male with incidentally found to have a left upper lobe nodule. This nodule appears mostly calcified and has not changed in 12 months. Patient's chronic cough is likely multifactorial but certainly there is a component to his chronic tobacco use. I do question an atypical infectious process but believes that holding off on antibiotic therapy at this time is quite reasonable. I am trying the patient on inhaled steroid therapy to see if we can decrease any airway inflammation that may be contributing again to his cough. I spent over 3 minutes counseling him on the value of tobacco cessation and preventing worsening of his underlying lung function. He'll contact my office for any further worsening in his cough.  1. Cough: Checking sputum culture for AFB, fungus, and bacteria. 2. Emphysema: Likely secondary to tobacco use. Starting Symbicort 160/4.5 with spacer. Patient to contact me for prescription if this is effective. 3. Left Upper Lobe Nodule: No need for further imaging as this is now mostly calcified. 4. GERD: Controlled with Nexium. No changes at this time. 5. Tobacco Use Disorder: Counseled for over 3 minutes on the for complete tobacco cessation. Patient has not yet resigned himself to quit. 6. Health Maintenance: Previously declined  influenza vaccine. 7. Follow-up: Patient to return to clinic 3 months or sooner if needed.  Sonia Baller Ashok Cordia, M.D. [facility] Pager:  [phone] After 3pm or if no response, call  [phone] 1:50 PM 11/09/15

## 2015-11-10 ENCOUNTER — Other Ambulatory Visit: Payer: Medicaid Other

## 2015-11-10 DIAGNOSIS — R059 Cough, unspecified: Secondary | ICD-10-CM

## 2015-11-10 DIAGNOSIS — R05 Cough: Secondary | ICD-10-CM

## 2015-11-13 LAB — RESPIRATORY CULTURE OR RESPIRATORY AND SPUTUM CULTURE: ORGANISM ID, BACTERIA: NORMAL

## 2015-12-05 ENCOUNTER — Ambulatory Visit (INDEPENDENT_AMBULATORY_CARE_PROVIDER_SITE_OTHER): Payer: Medicaid Other | Admitting: Internal Medicine

## 2015-12-05 ENCOUNTER — Encounter (INDEPENDENT_AMBULATORY_CARE_PROVIDER_SITE_OTHER): Payer: Self-pay | Admitting: Internal Medicine

## 2015-12-21 ENCOUNTER — Encounter: Payer: Self-pay | Admitting: Family

## 2015-12-23 ENCOUNTER — Other Ambulatory Visit: Payer: Self-pay

## 2015-12-23 ENCOUNTER — Other Ambulatory Visit: Payer: Self-pay | Admitting: *Deleted

## 2015-12-23 ENCOUNTER — Ambulatory Visit (INDEPENDENT_AMBULATORY_CARE_PROVIDER_SITE_OTHER): Payer: Medicaid Other | Admitting: Family

## 2015-12-23 ENCOUNTER — Encounter: Payer: Self-pay | Admitting: Family

## 2015-12-23 ENCOUNTER — Ambulatory Visit (HOSPITAL_COMMUNITY)
Admission: RE | Admit: 2015-12-23 | Discharge: 2015-12-23 | Disposition: A | Discharge status: Home / Self Care | Payer: Medicaid Other | Source: Ambulatory Visit | Attending: Family | Admitting: Family

## 2015-12-23 VITALS — BP 109/70 | HR 69 | Temp 97.4°F | Resp 16 | Ht 69.0 in | Wt 177.0 lb

## 2015-12-23 DIAGNOSIS — Z87891 Personal history of nicotine dependence: Secondary | ICD-10-CM | POA: Diagnosis not present

## 2015-12-23 DIAGNOSIS — I714 Abdominal aortic aneurysm, without rupture, unspecified: Secondary | ICD-10-CM

## 2015-12-23 DIAGNOSIS — Z789 Other specified health status: Secondary | ICD-10-CM

## 2015-12-23 DIAGNOSIS — Z720 Tobacco use: Secondary | ICD-10-CM

## 2015-12-23 DIAGNOSIS — Z95828 Presence of other vascular implants and grafts: Secondary | ICD-10-CM | POA: Diagnosis present

## 2015-12-23 NOTE — Progress Notes (Signed)
[facility] [place]  CC: Follow up s/p EVAR  History of Present Illness  [name] is a 65 y.o. (1950/10/05) male patient of Dr. Bridgett Larsson who presents for routine follow up s/p EVAR (Date: 06/21/10). Most recent EVAR duplex (Date: 05/30/12) demonstrates: no endoleak and shrinking sac size. Most recent CTA (Date: 12/05/2012) demonstrates: no endoleak and no sac. The patient has had chronic back and neck pain now with some radiculopathy. He attends [facility], Dr. Hardin Negus.  He denies any new back pain, reports abdominal pain that has been evaluated in 2016. He states his esophagus was dilated, but also states no etiology was found for his abdominal pain. He states he also has a hiatal hernia.  The patient denies any history of stroke or TIA, denies history of MI; he states he had a heart murmur as a child.  Pt Diabetic: No Pt smoker: smoker (1 ppd until October 2016, started vapor nicotine then, started smoking at age 38 yrs)    Past Medical History:  Diagnosis Date  . AAA (abdominal aortic aneurysm) ([place])    a. s/p EVAR 05/2010.  . Back pain   . CKD (chronic kidney disease), stage II   . Degenerative disk disease    a. Chronic back pain with ridiculopathy.  . Emphysema of lung ([place])   . Essential hypertension   . GERD (gastroesophageal reflux disease)   . Hiatal hernia   . Hyperlipidemia   . 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 Pulmonary nodule    a. 80mm nodule seen on CT 09/2014 - f/u recommended 1 yr.  . Skin cancer   . Tobacco abuse    Past Surgical History:  Procedure Laterality Date  . ABDOMINAL AORTIC ENDOVASCULAR STENT GRAFT  2014  . APPENDECTOMY    . BACK SURGERY    . BIOPSY N/A 06/10/2013   Procedure: BIOPSY;  Surgeon: Rogene Houston, MD;  Location: [facility];  Service: Endoscopy;  Laterality: N/A;  . BIOPSY  12/03/2014   Procedure: BIOPSY (Gastroesophageal Junction);  Surgeon: Rogene Houston,  MD;  Location: [facility];  Service: Endoscopy;;  . ESOPHAGEAL DILATION N/A 12/03/2014   Procedure: ESOPHAGEAL DILATION WITH 54FR AND 56FR MALONEY;  Surgeon: Rogene Houston, MD;  Location: [facility];  Service: Endoscopy;  Laterality: N/A;  . ESOPHAGOGASTRODUODENOSCOPY (EGD) WITH PROPOFOL N/A 06/10/2013   Procedure: ESOPHAGOGASTRODUODENOSCOPY (EGD) WITH PROPOFOL;  Surgeon: Rogene Houston, MD;  Location: [facility];  Service: Endoscopy;  Laterality: N/A;  . ESOPHAGOGASTRODUODENOSCOPY (EGD) WITH PROPOFOL N/A 12/03/2014   Procedure: ESOPHAGOGASTRODUODENOSCOPY (EGD) WITH PROPOFOL;  Surgeon: Rogene Houston, MD;  Location: [facility];  Service: Endoscopy;  Laterality: N/A;  . EVAR  06/20/10   for AAA repair   . MALONEY DILATION N/A 06/10/2013   Procedure: MALONEY DILATION 52 fr, 11 fr;  Surgeon: Rogene Houston, MD;  Location: [facility];  Service: Endoscopy;  Laterality: N/A;  . SPINAL FUSION     of neck and lower back   Social History Social History  Substance Use Topics  . Smoking status: Current Every Day Smoker    Packs/day: 1.00    Years: 48.00    Types: Cigarettes    Start date: 12/16/1965  . Smokeless tobacco: Never Used  . Alcohol use No   Family History Family History  Problem Relation Age of Onset  . Atrial fibrillation Mother   . Heart disease Mother  Patient unclear of what kind  . Deep vein thrombosis Mother   . Hyperlipidemia Mother   . Hypertension Mother   . Stroke Father   . Diabetes Father   . Heart disease Father     Patient unclear of what kind  . Kidney disease Father   . Hyperlipidemia Father   . Hypertension Father   . Diabetes Sister   . Heart disease Sister     Patient unclear of what kind  . Brain cancer Sister   . Seizures Sister   . Cancer Paternal Grandmother   . Lung disease Neg Hx   . Deep vein thrombosis Other   . Pulmonary embolism Other    Current Outpatient Prescriptions on File Prior to Visit  Medication Sig Dispense Refill  . albuterol (PROVENTIL)  (2.5 MG/3ML) 0.083% nebulizer solution Take 2.5 mg by nebulization every 6 (six) hours as needed. Shortness of breath    . aspirin 81 MG tablet Take 81 mg by mouth daily.     . budesonide-formoterol (SYMBICORT) 160-4.5 MCG/ACT inhaler Inhale 2 puffs into the lungs 2 (two) times daily. 1 Inhaler 0  . diazepam (VALIUM) 5 MG tablet Take 5 mg by mouth every 6 (six) hours as needed. For anxiety    . gabapentin (NEURONTIN) 400 MG capsule Take 400 mg by mouth daily as needed (for neuropathy).     . losartan-hydrochlorothiazide (HYZAAR) 100-12.5 MG tablet Take one-half tablet daily by mouth (Patient taking differently: Take by mouth daily. Take one-half tablet daily by mouth) 30 tablet 3  . NEXIUM 40 MG capsule TAKE 1 CAPSULE (40 MG TOTAL) BY MOUTH 2 (TWO) TIMES DAILY BEFORE A MEAL. 60 capsule 5  . Oxycodone HCl 20 MG TABS Take 20 mg by mouth every 4 (four) hours.    Marland Kitchen Spacer/Aero-Holding Chambers (AEROCHAMBER MV) inhaler Use as instructed 1 each 0   No current facility-administered medications on file prior to visit.    Allergies  Allergen Reactions  . Benazepril Hcl Other (See Comments)    Can't breathe  . Pantoprazole Sodium Other (See Comments)    Can't breathe  . Amlodipine Swelling     ROS: See HPI for pertinent positives and negatives.  Physical Examination  Vitals:   12/23/15 0841 12/23/15 0843  BP: 101/69 109/70  Pulse: 69   Resp: 16   Temp: 97.4 F (36.3 C)   SpO2: 99%   Weight: 177 lb (80.3 kg)   Height: 5\' 9"  (1.753 m)    Body mass index is 26.14 kg/m.  General: A&O x 3, WDWN  Pulmonary: Sym exp, respirations are non labored, fair air movt, CTAB, no rales, rhonchi, or wheezing. Occasional moist cough.  Cardiac: RRR, Nl S1, S2, no murmur appreciated   Vascular: Vessel Right Left  Radial Palpable Palpable  Brachial Palpable Palpable  Carotid Palpable, without bruit Palpable, without bruit  Aorta Not palpable N/A  Femoral  Palpable Palpable  Popliteal Not palpable Not palpable  PT Palpable Palpable  DP Palpable Palpable   Gastrointestinal: soft, NTND, -G/R, - HSM, - masses palpated, - CVAT B, no palpable AAA  Musculoskeletal: M/S 5/5 throughout. extremities without ischemic changes , no palpable popliteal aneurysms  Neurologic: Pain and light touch intact in extremities , Motor exam as listed above    CTA abd/pelvis (12/05/2012)  Based on Dr. Lianne Moris review of this patient's CTA, his endograft is in appropriate immediate infrarenal position with widely patent renal arteries and SMA and CA. There is no evidence of limb  dysfunction. The AAA sac is nearly non-existent.   Non-Invasive Vascular Imaging  EVAR Duplex (Date: 12/23/15)  AAA sac size: 3.2 cm x 3.9 cm  Technically difficult exam due to excessive bowel gas  The distal aorta s/p repair measures 3.2 cm x 3.9 cm  Iliac arteries not well visualized  No mention of endoleak detected  12-20-14: 3.0 cm   Medical Decision Making  [name] is a 65 y.o. male who presents s/p EVAR (Date: 06/21/10).  Pt is asymptomatic with increase in sac size from 3.0 to 3.9 cm in a year, based on limited visualization. I discussed with Dr. Bridgett Larsson results of today's abdominal duplex, based on limited visualization.  The patient was counseled re smoking cessation and given several free resources re smoking cessation.   I discussed with the patient the importance of surveillance of the endograft.  The next CTA will be scheduled for within 1 month, pt will follow up with Dr. Bridgett Larsson after this.  I emphasized the importance of maximal medical management including strict control of blood pressure, blood glucose, and lipid levels, antiplatelet agents, obtaining regular exercise, and cessation of smoking.   Thank you for allowing [place] to participate in this patient's care.  Clemon Chambers, RN, MSN, FNP-C [facility]  [place] Office: [phone]  Clinic Physician: Bridgett Larsson  12/23/2015, 8:52 AM

## 2016-01-25 ENCOUNTER — Ambulatory Visit
Admission: RE | Admit: 2016-01-25 | Discharge: 2016-01-25 | Disposition: A | Discharge status: Home / Self Care | Payer: Medicaid Other | Source: Ambulatory Visit | Attending: Vascular Surgery | Admitting: Vascular Surgery

## 2016-01-25 DIAGNOSIS — I714 Abdominal aortic aneurysm, without rupture, unspecified: Secondary | ICD-10-CM

## 2016-01-25 MED ORDER — IOPAMIDOL (ISOVUE-370) INJECTION 76%
75.0000 mL | Freq: Once | INTRAVENOUS | Status: AC | PRN
  Administered 2016-01-25: 75 mL via INTRAVENOUS

## 2016-02-07 ENCOUNTER — Encounter: Payer: Self-pay | Admitting: Vascular Surgery

## 2016-02-08 ENCOUNTER — Ambulatory Visit: Payer: Medicaid Other | Admitting: Pulmonary Disease

## 2016-02-09 NOTE — Progress Notes (Signed)
Established EVAR  History of Present Illness  [name] is a 66 y.o. ([birth year]) male who presents for routine follow up s/p EVAR (Date: 06/20/10).  Most recent EVAR duplex (Date: 12/23/15) demonstrates: no endoleak and increasing sac size.  Most recent CTA (Date: 01/25/16) demonstrates: no endoleak and small shrunken sac size.  The patient has no had back or abdominal pain.  The patient's PMH, PSH, SH, and FamHx are unchanged from 12/23/15.  Current Outpatient Prescriptions  Medication Sig Dispense Refill  . albuterol (PROVENTIL) (2.5 MG/3ML) 0.083% nebulizer solution Take 2.5 mg by nebulization every 6 (six) hours as needed. Shortness of breath    . aspirin 81 MG tablet Take 81 mg by mouth daily.     . budesonide-formoterol (SYMBICORT) 160-4.5 MCG/ACT inhaler Inhale 2 puffs into the lungs 2 (two) times daily. 1 Inhaler 0  . diazepam (VALIUM) 5 MG tablet Take 5 mg by mouth every 6 (six) hours as needed. For anxiety    . DOCOSAHEXAENOIC ACID PO Take 1 g by mouth.    . gabapentin (NEURONTIN) 400 MG capsule Take 400 mg by mouth daily as needed (for neuropathy).     . losartan-hydrochlorothiazide (HYZAAR) 100-12.5 MG tablet Take one-half tablet daily by mouth (Patient taking differently: Take by mouth daily. Take one-half tablet daily by mouth) 30 tablet 3  . NEXIUM 40 MG capsule TAKE 1 CAPSULE (40 MG TOTAL) BY MOUTH 2 (TWO) TIMES DAILY BEFORE A MEAL. 60 capsule 5  . Oxycodone HCl 20 MG TABS Take 20 mg by mouth every 4 (four) hours.    Marland Kitchen Spacer/Aero-Holding Chambers (AEROCHAMBER MV) inhaler Use as instructed 1 each 0   No current facility-administered medications for this visit.     Allergies  Allergen Reactions  . Benazepril Hcl Other (See Comments)    Can't breathe  . Pantoprazole Sodium Other (See Comments)    Can't breathe  . Amlodipine Swelling    On ROS today: LUQ intermittent pain, +emphysema   Physical Examination  Vitals:   02/10/16 0902  BP: 117/81  Pulse: 68    Resp: 20  Temp: 97.6 F (36.4 C)  TempSrc: Oral  SpO2: 96%  Weight: 179 lb 4.8 oz (81.3 kg)  Height: 5\' 9"  (1.753 m)    Body mass index is 26.48 kg/m.  General: Alert, O x 3, WD,NAD  Pulmonary: Sym exp, good B air movt,CTA B  Cardiac: RRR, Nl S1, S2, no Murmurs, No rubs, No S3,S4  Vascular: Vessel Right Left  Radial Palpable Palpable  Brachial Palpable Palpable  Carotid Palpable, No Bruit Palpable, No Bruit  Aorta Not palpable N/A  Femoral Palpable Palpable  Popliteal Not palpable Not palpable  PT Palpable Palpable  DP Palpable Palpable   Gastrointestinal: soft, non-distended, non-tender to palpation, No guarding or rebound, no HSM, no masses, no CVAT B, No palpable prominent aortic pulse,    Musculoskeletal: M/S 5/5 throughout  , Extremities without ischemic changes  , No edema present,   Neurologic: Pain and light touch intact in extremities, Motor exam as listed above  CTA Abd/Pelvis Duplex (01/25/16) Stable appearance of shrunken aortic sac surrounding the aortic endograft. The only change since prior studies is development of a mild amount of mural thrombus within the distal aspect of the right common iliac artery limb. No evidence of endoleak. The only other vascular finding is mildly more prominent fusiform dilatation of the celiac trunk compared to prior studies.  Based on my review of this patient's CTA, the  endograft remains in infrarenal position and there is no evidence of limb dysfunction.  There is no endoleak.  There is minimal sac present.  I'm not certain what is being measured as sac.   Medical Decision Making  [name] is a 66 y.o. male who presents s/p EVAR.  Pt is asymptomatic with nearly resolved aortic sac.   I'm not certain what is being measured as "sac".  There is essentially no sac left in this patient's AAA except a small sliver where the two limbs cross.    The combined limbs appeared be getting measured as the "sac."  Obviously  this is incorrect.    I discussed with the patient the importance of surveillance of the endograft.  The next endograft duplex will be scheduled for 12 months.  No further CTA will be needed.  The patient will follow up with [place] in 12 months with these studies.  Thank you for allowing [place] to participate in this patient's care.   Adele Barthel, MD, FACS [facility] Office: [phone] Pager: [phone]

## 2016-02-10 ENCOUNTER — Encounter: Payer: Self-pay | Admitting: Vascular Surgery

## 2016-02-10 ENCOUNTER — Ambulatory Visit (INDEPENDENT_AMBULATORY_CARE_PROVIDER_SITE_OTHER): Payer: Medicaid Other | Admitting: Vascular Surgery

## 2016-02-10 VITALS — BP 117/81 | HR 68 | Temp 97.6°F | Resp 20 | Ht 69.0 in | Wt 179.3 lb

## 2016-02-10 DIAGNOSIS — I714 Abdominal aortic aneurysm, without rupture, unspecified: Secondary | ICD-10-CM

## 2016-02-16 NOTE — Addendum Note (Signed)
Addended by: Ismelda Weatherman A on: 12/26/2016 04:17 PM   Modules accepted: Orders  

## 2016-02-22 ENCOUNTER — Other Ambulatory Visit (INDEPENDENT_AMBULATORY_CARE_PROVIDER_SITE_OTHER): Payer: Self-pay | Admitting: Internal Medicine

## 2016-02-22 NOTE — Telephone Encounter (Signed)
Patient will need to have appointment prior to further refills or he may get from his PCP.

## 2016-02-27 ENCOUNTER — Encounter: Payer: Self-pay | Admitting: Pulmonary Disease

## 2016-02-27 ENCOUNTER — Ambulatory Visit (INDEPENDENT_AMBULATORY_CARE_PROVIDER_SITE_OTHER)
Admission: RE | Admit: 2016-02-27 | Discharge: 2016-02-27 | Disposition: A | Discharge status: Home / Self Care | Payer: Medicaid Other | Source: Ambulatory Visit | Attending: Pulmonary Disease | Admitting: Pulmonary Disease

## 2016-02-27 ENCOUNTER — Ambulatory Visit (INDEPENDENT_AMBULATORY_CARE_PROVIDER_SITE_OTHER): Payer: Medicaid Other | Admitting: Pulmonary Disease

## 2016-02-27 VITALS — BP 110/70 | HR 64 | Ht 69.0 in | Wt 177.0 lb

## 2016-02-27 DIAGNOSIS — F17200 Nicotine dependence, unspecified, uncomplicated: Secondary | ICD-10-CM

## 2016-02-27 DIAGNOSIS — K219 Gastro-esophageal reflux disease without esophagitis: Secondary | ICD-10-CM | POA: Diagnosis not present

## 2016-02-27 DIAGNOSIS — J439 Emphysema, unspecified: Secondary | ICD-10-CM

## 2016-02-27 DIAGNOSIS — R042 Hemoptysis: Secondary | ICD-10-CM

## 2016-02-27 DIAGNOSIS — R1310 Dysphagia, unspecified: Secondary | ICD-10-CM

## 2016-02-27 NOTE — Patient Instructions (Signed)
   Call me if your cough doesn't improve, continues to have blood in it or you have any new symptoms.  Remember to drop off your sputum specimen within 4 hours of coughing it up. Early morning specimens are the best. Do not refrigerate the sputum.  I will see you back in 3 months or sooner if needed.  TESTS ORDERED: 1. Esophagram/Barium Swallow 2. CXR PA/LAT today 3. Sputum Culture for AFB, Fungus, & Bacteria

## 2016-02-27 NOTE — Progress Notes (Signed)
Subjective:    Patient ID: [name], male    DOB: [birth year], 66 y.o.   MRN: [id number]  C.C.:  Follow-up for Pulmonary Emphysema, GERD, & Tobacco Use Disorder.  HPI Pulmonary Emphysema: Started Symbicort 160/4.5 at last appointment with a sample. Reports his cough seems to be worse than his baseline. He reports he is cough is producing a "green to yellow" mucus. He reports it will intermittently have "strings of blood" in the mucus. Denies any frank hemoptysis. He reports his baseline dyspnea. He reports his dyspnea did improve on the Symbicort but he was worried about becoming "dependent" using it on a daily basis. No exacerbations since last appointment. Reports he uses his rescue inhaler sporadically up to 3 times daily and some days none at all.   GERD:  Prescribed Nexium. Denies any reflux or dyspepsia. He does have dysphagia at times in his neck with some subsequent odynophagia. Reports his last esophageal dilation was about 2 years ago but records review shows it was done in November 2016.   Tobacco Use Disorder:  Previously was using one half to one pack per day of cigarettes. Also previously using vapor cigarette. He reports he is still at a pack a day. He reports he does seem to cut back some days more than others. His wife does "holler at him" about the smoking.   Review of Systems Denies any fever, chills, or sweats. He reports he still have some abdominal discomfort in his right upper quadrant "under his ribs". Denies any nausea or emesis.   Allergies  Allergen Reactions  . Benazepril Hcl Other (See Comments)    Can't breathe  . Pantoprazole Sodium Other (See Comments)    Can't breathe  . Amlodipine Swelling    Current Outpatient Prescriptions on File Prior to Visit  Medication Sig Dispense Refill  . albuterol (PROVENTIL) (2.5 MG/3ML) 0.083% nebulizer solution Take 2.5 mg by nebulization every 6 (six) hours as needed. Shortness of breath    . aspirin 81 MG tablet Take  81 mg by mouth daily.     . diazepam (VALIUM) 5 MG tablet Take 5 mg by mouth every 6 (six) hours as needed. For anxiety    . DOCOSAHEXAENOIC ACID PO Take 1 g by mouth.    . gabapentin (NEURONTIN) 400 MG capsule Take 400 mg by mouth daily as needed (for neuropathy).     . losartan-hydrochlorothiazide (HYZAAR) 100-12.5 MG tablet Take one-half tablet daily by mouth (Patient taking differently: Take by mouth daily. Take one-half tablet daily by mouth) 30 tablet 3  . NEXIUM 40 MG capsule TAKE 1 CAPSULE (40 MG TOTAL) BY MOUTH 2 (TWO) TIMES DAILY BEFORE A MEAL. 60 capsule 5  . Oxycodone HCl 20 MG TABS Take 20 mg by mouth every 4 (four) hours.    Marland Kitchen Spacer/Aero-Holding Chambers (AEROCHAMBER MV) inhaler Use as instructed 1 each 0  . budesonide-formoterol (SYMBICORT) 160-4.5 MCG/ACT inhaler Inhale 2 puffs into the lungs 2 (two) times daily. (Patient not taking: Reported on 02/27/2016) 1 Inhaler 0   No current facility-administered medications on file prior to visit.     Past Medical History:  Diagnosis Date  . AAA (abdominal aortic aneurysm) ([place])    a. s/p EVAR 05/2010.  . Back pain   . CKD (chronic kidney disease), stage II   . Degenerative disk disease    a. Chronic back pain with ridiculopathy.  . Emphysema of lung ([place])   . Essential hypertension   . GERD (gastroesophageal  reflux disease)   . Hiatal hernia   . Hyperlipidemia   . 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 Pulmonary nodule    a. 42mm nodule seen on CT 09/2014 - f/u recommended 1 yr.  . Skin cancer   . Tobacco abuse     Past Surgical History:  Procedure Laterality Date  . ABDOMINAL AORTIC ENDOVASCULAR STENT GRAFT  2014  . APPENDECTOMY    . BACK SURGERY    . BIOPSY N/A 06/10/2013   Procedure: BIOPSY;  Surgeon: Rogene Houston, MD;  Location: [facility];  Service: Endoscopy;  Laterality: N/A;  . BIOPSY  12/03/2014   Procedure: BIOPSY (Gastroesophageal Junction);  Surgeon: Rogene Houston, MD;  Location:  [facility];  Service: Endoscopy;;  . ESOPHAGEAL DILATION N/A 12/03/2014   Procedure: ESOPHAGEAL DILATION WITH 54FR AND 56FR MALONEY;  Surgeon: Rogene Houston, MD;  Location: [facility];  Service: Endoscopy;  Laterality: N/A;  . ESOPHAGOGASTRODUODENOSCOPY (EGD) WITH PROPOFOL N/A 06/10/2013   Procedure: ESOPHAGOGASTRODUODENOSCOPY (EGD) WITH PROPOFOL;  Surgeon: Rogene Houston, MD;  Location: [facility];  Service: Endoscopy;  Laterality: N/A;  . ESOPHAGOGASTRODUODENOSCOPY (EGD) WITH PROPOFOL N/A 12/03/2014   Procedure: ESOPHAGOGASTRODUODENOSCOPY (EGD) WITH PROPOFOL;  Surgeon: Rogene Houston, MD;  Location: [facility];  Service: Endoscopy;  Laterality: N/A;  . EVAR  06/20/10   for AAA repair   . MALONEY DILATION N/A 06/10/2013   Procedure: MALONEY DILATION 52 fr, 31 fr;  Surgeon: Rogene Houston, MD;  Location: [facility];  Service: Endoscopy;  Laterality: N/A;  . SPINAL FUSION     of neck and lower back    Family History  Problem Relation Age of Onset  . Atrial fibrillation Mother   . Heart disease Mother     Patient unclear of what kind  . Deep vein thrombosis Mother   . Hyperlipidemia Mother   . Hypertension Mother   . Stroke Father   . Diabetes Father   . Heart disease Father     Patient unclear of what kind  . Kidney disease Father   . Hyperlipidemia Father   . Hypertension Father   . Diabetes Sister   . Heart disease Sister     Patient unclear of what kind  . Brain cancer Sister   . Seizures Sister   . Deep vein thrombosis Other   . Pulmonary embolism Other   . Cancer Paternal Grandmother   . Lung disease Neg Hx     Social History   Social History  . Marital status: Married    Spouse name: N/A  . Number of children: N/A  . Years of education: N/A   Social History Main Topics  . Smoking status: Current Every Day Smoker    Packs/day: 1.00    Years: 48.00    Types: Cigarettes    Start date: 12/16/1965  . Smokeless tobacco: Never Used  . Alcohol use No  . Drug use: No  . Sexual  activity: Not Asked   Other Topics Concern  . None   Social History Narrative   Originally from [place], [place]. Always lived in [place]. Travel to the [place] in March 2016. Has traveled to [place], [place], [place], & [place]. 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 No known asbestos exposure. Has 2 parakeets currently. Remotely raised parakeets, love birds, & cockatiels in a different house. No known mold  or hot tub exposure.       Objective:   Physical Exam BP 110/70 (BP Location: Right Arm, Patient Position: Sitting, Cuff Size: Normal)   Pulse 64   Ht 5\' 9"  (1.753 m)   Wt 177 lb (80.3 kg)   SpO2 97%   BMI 26.14 kg/m   General:  Awake. Elderly male. No acute distress.  Integument:  Warm & dry. No rash on exposed skin.  Extremities:  No cyanosis or clubbing.  HEENT:  Moist mucus membranes. No nasal turbinate swelling or erythema. No oral ulcers. Cardiovascular:  Regular rate. No edema. No appreciable JVD.  Pulmonary: Overall clear with auscultation. No accessory muscle use on room air. Speaking in complete sentences. Abdomen: Soft. Normal bowel sounds. Nondistended.  Musculoskeletal:  Normal bulk and tone. No joint deformity or effusion appreciated.  PFT 05/17/15: FVC 3.16 L (82%) FEV1 2.74 L (83%) FEV1/FVC 0.76 FEF 25-75 2.19 L (82%)                                                                                                               DLCO corrected 58% 12/31/14: FVC 3.15 L (72%) FEV1 2.59 L (78%) FEV1/FVC 0.82 FEF 25-75 2.74 L (101%) no bronchodilator response TLC 5.34 L (80%) RV 67% ERV 69% DLCO uncorrected 61%  6MWT 12/31/14:  Walked 326 meters / Baseline Sat 96% on RA / Nadir Sat 96% on RA  IMAGING CTA ABDOMEN/PELVIS 01/25/16 (lung windows personally reviewed by me): Well bibasilar atelectasis. No parenchymal nodule or opacity appreciated within limited lung windows. No pleural effusion or thickening  appreciated. Esophageal thickening noted.  CT CHEST W/O 10/18/15 (previously reviewed by me):  46mm left upper lobe nodule unchanged and now mostly calcified. No new nodule or opacity. Borderline pre-carinal adenopathy. No pleural effusion or thickening. No pericardial effusion.   CT CHEST W/O 03/31/15 (previously reviewed by me): 3 mm left upper lobe nodule unchanged. No new nodule appreciated. No pleural effusion or thickening. No pathologic mediastinal adenopathy. No pericardial effusion.  BARIUM SWALLOW (11/02/14): Small hiatal hernia. No GERD elicited. 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 No mass or ulcer seen. Mild esophageal dysmotility & mild cricopharyngeus muscle dysfunction c/w chronic GERD.  CTA CHEST 10/13/14 (previously reviewed by me): 3 mm left upper lobe nodule noted. No other nodule or opacity appreciated. Trace bilateral pleural effusions no appreciated pleural thickening. Dependent atelectasis noted bilaterally. Upper lobe predominant emphysematous changes with subpleural bleb noted within the left upper lobe apex. No pericardial effusion. No pulmonary embolus. Small hiatal hernia noted.  PATHOLOGY EGD BIOPSY (12/03/14): Barrett's Esophagus without dysplasia.  MICROBIOLOGY Sputum Culture (11/10/15):  Normal oral flora  LABS 10/29/14 ALPHA-1 AT: MM (145)  10/20/14 BMP: 140/4.3/103/24/14/1.27/93/9.2    Assessment & Plan:  66 y.o. caucasian male with pulmonary emphysema, GERD, and a tobacco use disorder. Patient has developed a blood-tinged sputum which could be due to his chronic tobacco use. I do not get the sense that he is having recurrent epistaxis. Certainly with his history  of tobacco use malignancy must be considered but I'm deferring bronchoscopy pending more complete chest imaging which may include a CT scan depending upon his x-ray and persistent hemoptysis. An infectious etiology is certainly possible and will need to be addressed with  cultures. With his history of esophageal dilatation and ongoing dysphagia. Esophagram is most prudent. With his improved symptom control on Symbicort I educated him and encouraged him to use his inhaler twice daily as prescribed. I instructed the patient contact my office if he had any new breathing problems or questions before next appointment.  1. Pulmonary Emphysema: Patient counseled to use his Symbicort inhaler daily as prescribed. Continuing albuterol inhaler as needed. 2. Hemoptysis:  Checking chest x-ray PA/LAT today. Briefly discussed the possible need for bronchoscopy with airway inspection pending result and continued hemoptysis. If hemoptysis persists he was instructed to notify me. Checking sputum culture for AFB, fungus, and bacteria. 3. GERD w/ Dysphagia: Continuing Nexium. Checking esophagogram/barium swallow. Patient may need follow-up with GI for repeat EGD and dilatation. 4. Tobacco Use Disorder: Patient counseled for over 3 minutes on tobacco cessation education to prevent worsening of his pulmonary function and lessen his risk of malignancy. 5. Health Maintenance: Previously declined influenza vaccine. 6. Follow-up: Patient to return to clinic 3 months or sooner if needed.  Sonia Baller Ashok Cordia, M.D. [facility] Pager:  [phone] After 3pm or if no response, call [phone] 1:59 PM 02/27/16

## 2016-03-01 ENCOUNTER — Ambulatory Visit (HOSPITAL_COMMUNITY)
Admission: RE | Admit: 2016-03-01 | Discharge: 2016-03-01 | Disposition: A | Discharge status: Home / Self Care | Payer: Medicaid Other | Source: Ambulatory Visit | Attending: Pulmonary Disease | Admitting: Pulmonary Disease

## 2016-03-01 DIAGNOSIS — K224 Dyskinesia of esophagus: Secondary | ICD-10-CM | POA: Insufficient documentation

## 2016-03-01 DIAGNOSIS — K449 Diaphragmatic hernia without obstruction or gangrene: Secondary | ICD-10-CM | POA: Diagnosis not present

## 2016-03-01 DIAGNOSIS — R1310 Dysphagia, unspecified: Secondary | ICD-10-CM | POA: Insufficient documentation

## 2016-03-02 ENCOUNTER — Other Ambulatory Visit: Payer: Medicaid Other

## 2016-03-02 DIAGNOSIS — R042 Hemoptysis: Secondary | ICD-10-CM

## 2016-03-05 LAB — RESPIRATORY CULTURE OR RESPIRATORY AND SPUTUM CULTURE
Gram Stain: NONE SEEN
Organism ID, Bacteria: NORMAL

## 2016-03-05 NOTE — Progress Notes (Signed)
Spoke with patient and informed him of medical results and recommendations. Pt had no additional questions. Nothing further needed.

## 2016-03-14 ENCOUNTER — Encounter (INDEPENDENT_AMBULATORY_CARE_PROVIDER_SITE_OTHER): Payer: Self-pay | Admitting: Internal Medicine

## 2016-03-14 NOTE — Telephone Encounter (Signed)
Patient was given an appointment for 06/25/16 at 10:15am with Deberah Castle, NP.  A letter was mailed to the patient.

## 2016-03-23 ENCOUNTER — Other Ambulatory Visit: Payer: Self-pay | Admitting: Pulmonary Disease

## 2016-04-02 LAB — FUNGUS CULTURE W SMEAR

## 2016-04-17 LAB — AFB CULTURE WITH SMEAR (NOT AT ARMC)

## 2016-06-04 ENCOUNTER — Telehealth: Payer: Self-pay | Admitting: Gastroenterology

## 2016-06-04 ENCOUNTER — Encounter: Payer: Self-pay | Admitting: Internal Medicine

## 2016-06-04 ENCOUNTER — Ambulatory Visit (INDEPENDENT_AMBULATORY_CARE_PROVIDER_SITE_OTHER): Payer: Medicaid Other | Admitting: Pulmonary Disease

## 2016-06-04 ENCOUNTER — Encounter: Payer: Self-pay | Admitting: Pulmonary Disease

## 2016-06-04 VITALS — BP 122/60 | HR 58 | Ht 69.0 in | Wt 176.2 lb

## 2016-06-04 DIAGNOSIS — K219 Gastro-esophageal reflux disease without esophagitis: Secondary | ICD-10-CM

## 2016-06-04 DIAGNOSIS — F17200 Nicotine dependence, unspecified, uncomplicated: Secondary | ICD-10-CM | POA: Diagnosis not present

## 2016-06-04 DIAGNOSIS — J439 Emphysema, unspecified: Secondary | ICD-10-CM

## 2016-06-04 MED ORDER — BUDESONIDE-FORMOTEROL FUMARATE 160-4.5 MCG/ACT IN AERO: 2.0000 | INHALATION_SPRAY | Freq: Two times a day (BID) | RESPIRATORY_TRACT | 6 refills | Status: DC

## 2016-06-04 NOTE — Telephone Encounter (Signed)
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 Patient is requesting to transfer back to our office because he does not want to drive to [place]. He is wanting all of his doctors to be in [place]. Records printed from [facility] and placed on Dr. Lynne Leader desk for review.

## 2016-06-04 NOTE — Patient Instructions (Signed)
   We are referring you to a GI specialist in my office.  Call me if your cough or breathing gets worse.  We are sending in a prescription to get you back on your Symbicort inhaler. Make sure you rinse, gargle, & spit afterward to keep from getting thrush.  Call me if you start coughing up any more blood.

## 2016-06-04 NOTE — Progress Notes (Signed)
Subjective:    Patient ID: [name], male    DOB: 05-13-1950, 66 y.o.   MRN: [id number]  C.C.:  Follow-up for Pulmonary Emphysema, Hemoptysis, GERD, & Tobacco Use Disorder.  HPI Pulmonary emphysema: Continued on Symbicort 160/4.5 at last appointment with albuterol rescue inhaler. He recently was only using his Symbicort inhaler once daily. Recommended at last appointment to use twice daily as prescribed. Cough is still producing a mucus that is "green, yellow, & clear" at times. He does have some dyspnea, particularly in the mornings. He denies any wheezing. He is out of his Symbicort. He is using his rescue inhaler at most 2-3 times daily but hasn't needed it as frequently recently.   Hemoptysis: Reported at last appointment. Sputum culture negative. Chest x-ray without obvious parenchymal abnormality. He reports he hasn't noticed any further hemoptysis since last appointment.   GERD: Previously prescribed Nexium. Prior esophageal dilatation in November 2016. Esophagogram/barium swallow essentially normal. He still has significant reflux. He does wake up with a brash water taste. He did have a mild stricture that explains his dysphagia but wants to see a new GI specialist locally in [place].   Tobacco use disorder: Previously smoking 0.5-1 pack per day of cigarettes at last appointment. Still smoking regularly. He admits he smokes more when he is inactive. He smokes less in the Summer. He is still smoking up to 1ppd.   Review of Systems He is using a nasal spray that helps with his sinus drainage, pressure, & congestion. No chest pressure or pain. No fever, chills, or sweats.   Allergies  Allergen Reactions  . Benazepril Hcl Other (See Comments)    Can't breathe  . Pantoprazole Sodium Other (See Comments)    Can't breathe  . Amlodipine Swelling    Current Outpatient Prescriptions on File Prior to Visit  Medication Sig Dispense Refill  . albuterol (PROVENTIL) (2.5 MG/3ML)  0.083% nebulizer solution Take 2.5 mg by nebulization every 6 (six) hours as needed. Shortness of breath    . aspirin 81 MG tablet Take 81 mg by mouth daily.     . diazepam (VALIUM) 5 MG tablet Take 5 mg by mouth every 6 (six) hours as needed. For anxiety    . gabapentin (NEURONTIN) 400 MG capsule Take 400 mg by mouth daily as needed (for neuropathy).     . losartan-hydrochlorothiazide (HYZAAR) 100-12.5 MG tablet Take one-half tablet daily by mouth (Patient taking differently: Take by mouth daily. Take one-half tablet daily by mouth) 30 tablet 3  . NEXIUM 40 MG capsule TAKE 1 CAPSULE (40 MG TOTAL) BY MOUTH 2 (TWO) TIMES DAILY BEFORE A MEAL. 60 capsule 5  . Oxycodone HCl 20 MG TABS Take 20 mg by mouth every 4 (four) hours.    Marland Kitchen PROAIR HFA 108 (90 Base) MCG/ACT inhaler INHALE 2 PUFFS EVERY 6 HOURS AS NEEDED 8.5 Inhaler 2  . Spacer/Aero-Holding Chambers (AEROCHAMBER MV) inhaler Use as instructed 1 each 0  . budesonide-formoterol (SYMBICORT) 160-4.5 MCG/ACT inhaler Inhale 2 puffs into the lungs 2 (two) times daily. (Patient not taking: Reported on 06/04/2016) 1 Inhaler 0  . DOCOSAHEXAENOIC ACID PO Take 1 g by mouth.     No current facility-administered medications on file prior to visit.     Past Medical History:  Diagnosis Date  . AAA (abdominal aortic aneurysm) ([place])    a. s/p EVAR 05/2010.  . Back pain   . CKD (chronic kidney disease), stage II   . Degenerative disk disease  a. Chronic back pain with ridiculopathy.  . Emphysema of lung ([place])   . Essential hypertension   . GERD (gastroesophageal reflux disease)   . Hiatal hernia   . Hyperlipidemia   . 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 Pulmonary nodule    a. 36mm nodule seen on CT 09/2014 - f/u recommended 1 yr.  . Skin cancer   . Tobacco abuse     Past Surgical History:  Procedure Laterality Date  . ABDOMINAL AORTIC ENDOVASCULAR STENT GRAFT  2014  . APPENDECTOMY    . BACK SURGERY    . BIOPSY N/A  06/10/2013   Procedure: BIOPSY;  Surgeon: Rogene Houston, MD;  Location: [facility];  Service: Endoscopy;  Laterality: N/A;  . BIOPSY  12/03/2014   Procedure: BIOPSY (Gastroesophageal Junction);  Surgeon: Rogene Houston, MD;  Location: [facility];  Service: Endoscopy;;  . ESOPHAGEAL DILATION N/A 12/03/2014   Procedure: ESOPHAGEAL DILATION WITH 54FR AND 56FR MALONEY;  Surgeon: Rogene Houston, MD;  Location: [facility];  Service: Endoscopy;  Laterality: N/A;  . ESOPHAGOGASTRODUODENOSCOPY (EGD) WITH PROPOFOL N/A 06/10/2013   Procedure: ESOPHAGOGASTRODUODENOSCOPY (EGD) WITH PROPOFOL;  Surgeon: Rogene Houston, MD;  Location: [facility];  Service: Endoscopy;  Laterality: N/A;  . ESOPHAGOGASTRODUODENOSCOPY (EGD) WITH PROPOFOL N/A 12/03/2014   Procedure: ESOPHAGOGASTRODUODENOSCOPY (EGD) WITH PROPOFOL;  Surgeon: Rogene Houston, MD;  Location: [facility];  Service: Endoscopy;  Laterality: N/A;  . EVAR  06/20/10   for AAA repair   . MALONEY DILATION N/A 06/10/2013   Procedure: MALONEY DILATION 52 fr, 2 fr;  Surgeon: Rogene Houston, MD;  Location: [facility];  Service: Endoscopy;  Laterality: N/A;  . SPINAL FUSION     of neck and lower back    Family History  Problem Relation Age of Onset  . Atrial fibrillation Mother   . Heart disease Mother        Patient unclear of what kind  . Deep vein thrombosis Mother   . Hyperlipidemia Mother   . Hypertension Mother   . Stroke Father   . Diabetes Father   . Heart disease Father        Patient unclear of what kind  . Kidney disease Father   . Hyperlipidemia Father   . Hypertension Father   . Diabetes Sister   . Heart disease Sister        Patient unclear of what kind  . Brain cancer Sister   . Seizures Sister   . Deep vein thrombosis Other   . Pulmonary embolism Other   . Cancer Paternal Grandmother   . Lung disease Neg Hx     Social History   Social History  . Marital status: Married    Spouse name: N/A  . Number of children: N/A  . Years of education: N/A     Social History Main Topics  . Smoking status: Current Every Day Smoker    Packs/day: 1.00    Years: 48.00    Types: Cigarettes    Start date: 12/16/1965  . Smokeless tobacco: Never Used  . Alcohol use No  . Drug use: No  . Sexual activity: Not Asked   Other Topics Concern  . None   Social History Narrative   Originally from [place], [place]. Always lived in [place]. Travel to the [place] in March 2016. Has traveled to [place], [place], [place], & [place]. Previously has worked Associate Professor. Has exposure to fumes  from glue. 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 No known asbestos exposure. Has 2 parakeets currently. Remotely raised parakeets, love birds, & cockatiels in a different house. No known mold or hot tub exposure.       Objective:   Physical Exam BP 122/60 (BP Location: Right Arm, Patient Position: Sitting, Cuff Size: Normal)   Pulse (!) 58   Ht 5\' 9"  (1.753 m)   Wt 176 lb 3.2 oz (79.9 kg)   SpO2 97%   BMI 26.02 kg/m   General:  Awake. No distress. Elderly male. Integument:  Warm & dry. No bruising. Extremities:  No cyanosis or clubbing.  HEENT:  Moist mucus membranes. No oral ulcers. No nasal turbinate swelling. No scleral icterus. Cardiovascular:  Regular rate. Regular rhythm. No edema. Pulmonary:  Minimal mid and apical end expiratory wheeze. Otherwise good aeration. Normal work of breathing on room air. Abdomen: Soft. Normal bowel sounds. Nondistended.  Neurological: Cranial nerves grossly intact. No meningismus. Alert and oriented 4.  PFT 05/17/15: FVC 3.16 L (82%) FEV1 2.74 L (83%) FEV1/FVC 0.76 FEF 25-75 2.19 L (82%)                                                                                                               DLCO corrected 58% 12/31/14: FVC 3.15 L (72%) FEV1 2.59 L (78%) FEV1/FVC 0.82 FEF 25-75 2.74 L (101%) no bronchodilator response TLC 5.34 L (80%) RV 67% ERV 69% DLCO uncorrected 61%  6MWT 12/31/14:  Walked 326 meters / Baseline Sat 96%  on RA / Nadir Sat 96% on RA  IMAGING CXR PA/LAT 02/27/16 (personally reviewed by me):  No parenchymal nodule or opacity appreciated. No pleural effusion. Heart normal in size & mediastinum normal in contour.  ESOPHAGRAM/BARIUM SWALLOW 03/01/16 (per radiologist):  Mild esophageal dysmotility, likely presbyesophagus. Small hiatal hernia. Similar appearance of a mild stricture at the gastroesophageal junction. Delayed passage of 13 mm barium tablet, likely secondary to the above issues.  CTA ABDOMEN/PELVIS 01/25/16 (lung windows previously reviewed by me): Well bibasilar atelectasis. No parenchymal nodule or opacity appreciated within limited lung windows. No pleural effusion or thickening appreciated. Esophageal thickening noted.  CT CHEST W/O 10/18/15 (previously reviewed by me):  41mm left upper lobe nodule unchanged and now mostly calcified. No new nodule or opacity. Borderline pre-carinal adenopathy. No pleural effusion or thickening. No pericardial effusion.   CT CHEST W/O 03/31/15 (previously reviewed by me): 3 mm left upper lobe nodule unchanged. No new nodule appreciated. No pleural effusion or thickening. No pathologic mediastinal adenopathy. No pericardial effusion.  BARIUM SWALLOW (11/02/14): Small hiatal hernia. No GERD elicited. 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 No mass or ulcer seen. Mild esophageal dysmotility & mild cricopharyngeus muscle dysfunction c/w chronic GERD.  CTA CHEST 10/13/14 (previously reviewed by me): 3 mm left upper lobe nodule noted. No other nodule or opacity appreciated. Trace bilateral pleural effusions no appreciated pleural thickening. Dependent atelectasis noted bilaterally. Upper lobe predominant emphysematous changes with subpleural bleb noted within  the left upper lobe apex. No pericardial effusion. No pulmonary embolus. Small hiatal hernia noted.  PATHOLOGY EGD BIOPSY (12/03/14): Barrett's Esophagus without  dysplasia.  MICROBIOLOGY Sputum Culture (03/02/16): Normal oral flora/AFB negative/light growth of yeast with multiple morphologic types Sputum Culture (11/10/15):  Normal oral flora  LABS 10/29/14 ALPHA-1 AT: MM (145)  10/20/14 BMP: 140/4.3/103/24/14/1.27/93/9.2    Assessment & Plan:  66 y.o. male with pulmonary emphysema, hemoptysis, GERD, and tobacco use disorder. Reviewed patient's x-ray imaging from last appointment which shows no evidence of or cause for his hemoptysis. His hemoptysis has completely resolved. No need for further imaging at this time or bronchoscopy. Reflux is still suboptimally controlled and with the stricture noted on his esophagram I believe evaluation by GI is necessary. Patient would like to see a GI specialist in [place]. Continues to smoke tobacco and we did discuss this use at length.  1. Pulmonary emphysema:  Sending in prescriptions for Symbicort inhaler. Patient to resume use. Continuing albuterol inhaler as needed. 2. Hemoptysis: Resolved. Patient to notify me for any new symptoms.  3. GERD: Continuing Nexium. 4. Stricture/dysphagia: Referring to lower GI for evaluation and esophageal dilatation. 5. Tobacco use disorder: Patient counseled for over 3 minutes on the need for complete tobacco cessation. Unable to quit smoking. 6. Health maintenance: Previously declined vaccination. 7. Follow-up: Return to clinic in 6 months or sooner if needed.  Sonia Baller Ashok Cordia, M.D. [facility]monary [facility] Pager:  [phone] After 3pm or if no response, call [phone] 9:29 AM 06/04/16

## 2016-06-05 ENCOUNTER — Encounter: Payer: Self-pay | Admitting: Gastroenterology

## 2016-06-05 NOTE — Telephone Encounter (Signed)
Dr. Fuller Plan reviewed records and has accepted patient. Appointment scheduled.

## 2016-06-14 ENCOUNTER — Telehealth: Payer: Self-pay

## 2016-06-14 NOTE — Telephone Encounter (Signed)
PA for Symbicort 160 came in today and pt has Medicaid. I do not see where he has tried and failed Qvar or Flovent. I called the pharmacy and they could only give me a 2 year history of medications. Do you want to switch medications or go through with PA?

## 2016-06-19 NOTE — Telephone Encounter (Signed)
He has not tried either that I am aware of. However, he is getting symptomatic benefit from the Symbicort. Therefore start the PA please. Thanks.

## 2016-06-19 NOTE — Telephone Encounter (Signed)
Called [employer] to initiate PA -- Reference # I [id number] Patient ID# [id number] [name] with [name], [place] initiated for Symbicort 143mcg PA # [id number] Approved through 06/09/18  Called [facility] pharmacy and made aware of this approval.   ATC pt, no answer/no voicemail, [facility]

## 2016-06-25 ENCOUNTER — Ambulatory Visit (INDEPENDENT_AMBULATORY_CARE_PROVIDER_SITE_OTHER): Payer: Medicaid Other | Admitting: Internal Medicine

## 2016-07-11 ENCOUNTER — Telehealth (INDEPENDENT_AMBULATORY_CARE_PROVIDER_SITE_OTHER): Payer: Self-pay | Admitting: Internal Medicine

## 2016-07-11 MED ORDER — ESOMEPRAZOLE MAGNESIUM 40 MG PO CPDR: DELAYED_RELEASE_CAPSULE | ORAL | 5 refills | Status: DC

## 2016-07-16 NOTE — Telephone Encounter (Signed)
err

## 2016-07-18 ENCOUNTER — Encounter: Payer: Self-pay | Admitting: Gastroenterology

## 2016-07-18 ENCOUNTER — Ambulatory Visit (INDEPENDENT_AMBULATORY_CARE_PROVIDER_SITE_OTHER): Payer: Medicaid Other | Admitting: Gastroenterology

## 2016-07-18 VITALS — BP 110/70 | HR 60 | Ht 69.0 in | Wt 174.0 lb

## 2016-07-18 DIAGNOSIS — R1310 Dysphagia, unspecified: Secondary | ICD-10-CM

## 2016-07-18 DIAGNOSIS — R933 Abnormal findings on diagnostic imaging of other parts of digestive tract: Secondary | ICD-10-CM | POA: Diagnosis not present

## 2016-07-18 DIAGNOSIS — K219 Gastro-esophageal reflux disease without esophagitis: Secondary | ICD-10-CM

## 2016-07-18 DIAGNOSIS — Z1212 Encounter for screening for malignant neoplasm of rectum: Secondary | ICD-10-CM

## 2016-07-18 DIAGNOSIS — Z1211 Encounter for screening for malignant neoplasm of colon: Secondary | ICD-10-CM

## 2016-07-18 MED ORDER — SUPREP BOWEL PREP KIT 17.5-3.13-1.6 GM/177ML PO SOLN: ORAL | 0 refills | Status: DC

## 2016-07-18 NOTE — Patient Instructions (Signed)
Normal BMI (Body Mass Index- based on height and weight) is between 19 and 25. Your BMI today is Body mass index is 25.7 kg/m. [name] Please consider follow up  regarding your BMI with your Primary Care Provider.  You have been scheduled for an endoscopy and colonoscopy. Please follow the written instructions given to you at your visit today. Please pick up your prep supplies at the pharmacy within the next 1-3 days. If you use inhalers (even only as needed), please bring them with you on the day of your procedure. Your physician has requested that you go to www.startemmi.com and enter the access code given to you at your visit today. This web site gives a general overview about your procedure. However, you should still follow specific instructions given to you by our office regarding your preparation for the procedure.  You have been anti-reflux measures handout.

## 2016-07-18 NOTE — Progress Notes (Signed)
History of Present Illness: This is a 66 year old male referred by Javier Glazier, MD for the evaluation of recurrent dysphagia. Previously cared for by Dr. Laural Golden with 2 endoscopies as outlined below. Patient cannot recall whether he had a benefit from the prior dilations. He's had difficulty swallowing solid foods for over one year. He occasionally has difficulty with liquids. He notes occasional heartburn symptoms that are partially controlled with daily esomeprazole. He feels the branded Nexium was more effective in controlling his symptoms. Colonoscopy was performed in 12/2003 showed diverticulosis and internal hemorrhoids. Denies weight loss, abdominal pain, constipation, diarrhea, change in stool caliber, melena, hematochezia, nausea, vomiting, chest pain.  EGDs in 05/2013 and 11/2014 by Dr. Laural Golden: no stricture noted however had small proximal esophageal mucosal tears below UES following dilation felt to indicate a disrupted web. Small HH was noted. BIopsies did not show EoE.   Barium esophagram IMPRESSION: 1. Mild esophageal dysmotility, likely presbyesophagus. 2. Small hiatal hernia. Similar appearance of a mild stricture at the gastroesophageal junction. 3. Delayed passage of 13 mm barium tablet, likely secondary to the above issues.   Allergies  Allergen Reactions  . Benazepril Hcl Other (See Comments)    Can't breathe  . Pantoprazole Sodium Other (See Comments)    Can't breathe  . Amlodipine Swelling   Outpatient Medications Prior to Visit  Medication Sig Dispense Refill  . albuterol (PROVENTIL) (2.5 MG/3ML) 0.083% nebulizer solution Take 2.5 mg by nebulization every 6 (six) hours as needed. Shortness of breath    . aspirin 81 MG tablet Take 81 mg by mouth daily.     . budesonide-formoterol (SYMBICORT) 160-4.5 MCG/ACT inhaler Inhale 2 puffs into the lungs 2 (two) times daily. 1 Inhaler 6  . diazepam (VALIUM) 5 MG tablet Take 5 mg by mouth every 6 (six) hours as needed.  For anxiety    . esomeprazole (NEXIUM) 40 MG capsule TAKE 1 CAPSULE (40 MG TOTAL) BY MOUTH 2 (TWO) TIMES DAILY BEFORE A MEAL. 60 capsule 5  . gabapentin (NEURONTIN) 400 MG capsule Take 400 mg by mouth daily as needed (for neuropathy).     . losartan-hydrochlorothiazide (HYZAAR) 100-12.5 MG tablet Take one-half tablet daily by mouth (Patient taking differently: Take by mouth daily. Take one-half tablet daily by mouth) 30 tablet 3  . Oxycodone HCl 20 MG TABS Take 20 mg by mouth every 4 (four) hours.    Marland Kitchen PROAIR HFA 108 (90 Base) MCG/ACT inhaler INHALE 2 PUFFS EVERY 6 HOURS AS NEEDED 8.5 Inhaler 2  . Spacer/Aero-Holding Chambers (AEROCHAMBER MV) inhaler Use as instructed 1 each 0  . DOCOSAHEXAENOIC ACID PO Take 1 g by mouth.     No facility-administered medications prior to visit.    Past Medical History:  Diagnosis Date  . AAA (abdominal aortic aneurysm) ([place])    a. s/p EVAR 05/2010.  . Back pain   . CKD (chronic kidney disease), stage II   . Degenerative disk disease    a. Chronic back pain with ridiculopathy.  . Diverticulosis   . Emphysema of lung ([place])   . Essential hypertension   . GERD (gastroesophageal reflux disease)   . Hiatal hernia   . Hyperlipidemia   . 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 Pulmonary nodule    a. 70mm nodule seen on CT 09/2014 - f/u recommended 1 yr.  . Skin cancer   . Tobacco abuse    Past Surgical History:  Procedure  Laterality Date  . ABDOMINAL AORTIC ENDOVASCULAR STENT GRAFT  2014  . APPENDECTOMY    . BACK SURGERY    . BIOPSY N/A 06/10/2013   Procedure: BIOPSY;  Surgeon: Rogene Houston, MD;  Location: [facility];  Service: Endoscopy;  Laterality: N/A;  . BIOPSY  12/03/2014   Procedure: BIOPSY (Gastroesophageal Junction);  Surgeon: Rogene Houston, MD;  Location: [facility];  Service: Endoscopy;;  . ESOPHAGEAL DILATION N/A 12/03/2014   Procedure: ESOPHAGEAL DILATION WITH 54FR AND 56FR MALONEY;  Surgeon: Rogene Houston, MD;   Location: [facility];  Service: Endoscopy;  Laterality: N/A;  . ESOPHAGOGASTRODUODENOSCOPY (EGD) WITH PROPOFOL N/A 06/10/2013   Procedure: ESOPHAGOGASTRODUODENOSCOPY (EGD) WITH PROPOFOL;  Surgeon: Rogene Houston, MD;  Location: [facility];  Service: Endoscopy;  Laterality: N/A;  . ESOPHAGOGASTRODUODENOSCOPY (EGD) WITH PROPOFOL N/A 12/03/2014   Procedure: ESOPHAGOGASTRODUODENOSCOPY (EGD) WITH PROPOFOL;  Surgeon: Rogene Houston, MD;  Location: [facility];  Service: Endoscopy;  Laterality: N/A;  . EVAR  06/20/10   for AAA repair   . MALONEY DILATION N/A 06/10/2013   Procedure: MALONEY DILATION 52 fr, 52 fr;  Surgeon: Rogene Houston, MD;  Location: [facility];  Service: Endoscopy;  Laterality: N/A;  . SPINAL FUSION     of neck and lower back   Social History   Social History  . Marital status: Married    Spouse name: N/A  . Number of children: N/A  . Years of education: N/A   Social History Main Topics  . Smoking status: Current Every Day Smoker    Packs/day: 1.00    Years: 48.00    Types: Cigarettes    Start date: 12/16/1965  . Smokeless tobacco: Never Used  . Alcohol use No  . Drug use: No  . Sexual activity: Not Asked   Other Topics Concern  . None   Social History Narrative   Originally from [place], [place]. Always lived in [place]. Travel to the [place] in March 2016. Has traveled to [place], [place], [place], & [place]. 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 No known asbestos exposure. Has 2 parakeets currently. Remotely raised parakeets, love birds, & cockatiels in a different house. No known mold or hot tub exposure.    Family History  Problem Relation Age of Onset  . Atrial fibrillation Mother   . Heart disease Mother        Patient unclear of what kind  . Deep vein thrombosis Mother   . Hyperlipidemia Mother   . Hypertension Mother   . Stroke Father   . Diabetes Father   . Heart disease Father        Patient  unclear of what kind  . Kidney disease Father   . Hyperlipidemia Father   . Hypertension Father   . Diabetes Sister   . Heart disease Sister        Patient unclear of what kind  . Brain cancer Sister   . Seizures Sister   . Deep vein thrombosis Other   . Pulmonary embolism Other   . Cancer Paternal Grandmother   . Lung disease Neg Hx       Review of Systems: Pertinent positive and negative review of systems were noted in the above HPI section. All other review of systems were otherwise negative.    Physical Exam: General: Well developed, well nourished, no acute distress Head: Normocephalic and atraumatic Eyes:  sclerae anicteric,  EOMI Ears: Normal auditory acuity Mouth: No deformity or lesions Neck: Supple, no masses or thyromegaly Lungs: Clear throughout to auscultation Heart: Regular rate and rhythm; no murmurs, rubs or bruits Abdomen: Soft, non tender and non distended. No masses, hepatosplenomegaly or hernias noted. Normal Bowel sounds Rectal: deferred to colonoscopy Musculoskeletal: Symmetrical with no gross deformities  Skin: No lesions on visible extremities Pulses:  Normal pulses noted Extremities: No clubbing, cyanosis, edema or deformities noted Neurological: Alert oriented x 4, grossly nonfocal Cervical Nodes:  No significant cervical adenopathy Inguinal Nodes: No significant inguinal adenopathy Psychological:  Alert and cooperative. Normal mood and affect  Assessment and Recommendations:  1. Dysphagia. Barium tablet hold up in distal esophagus with mild stricture noted. Dysmotility noted on barium esophagram. 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 Schedule EGD with dilation. The risks (including bleeding, perforation, infection, missed lesions, medication reactions and possible hospitalization or surgery if complications occur), benefits, and alternatives to endoscopy with possible biopsy and possible dilation were discussed with the patient and  they consent to proceed.   2. GERD. Good but not excellent control of symptoms. Follow antireflux measures. Quit smoking. Continue esomeprazole 40 mg daily. Consider adjusting his medication regimen following endoscopy.  3. CRC screening, average risk. The risks (including bleeding, perforation, infection, missed lesions, medication reactions and possible hospitalization or surgery if complications occur), benefits, and alternatives to colonoscopy with possible biopsy and possible polypectomy were discussed with the patient and they consent to proceed.    cc: Javier Glazier, MD [id number], [place] [address]

## 2016-08-22 ENCOUNTER — Encounter: Payer: Self-pay | Admitting: Gastroenterology

## 2016-08-22 ENCOUNTER — Ambulatory Visit (AMBULATORY_SURGERY_CENTER): Payer: Medicaid Other | Admitting: Gastroenterology

## 2016-08-22 VITALS — BP 115/68 | HR 54 | Temp 98.4°F | Resp 30 | Ht 69.0 in | Wt 174.0 lb

## 2016-08-22 DIAGNOSIS — R1310 Dysphagia, unspecified: Secondary | ICD-10-CM

## 2016-08-22 DIAGNOSIS — R933 Abnormal findings on diagnostic imaging of other parts of digestive tract: Secondary | ICD-10-CM | POA: Diagnosis not present

## 2016-08-22 DIAGNOSIS — Z1212 Encounter for screening for malignant neoplasm of rectum: Secondary | ICD-10-CM | POA: Diagnosis not present

## 2016-08-22 DIAGNOSIS — D125 Benign neoplasm of sigmoid colon: Secondary | ICD-10-CM

## 2016-08-22 DIAGNOSIS — K635 Polyp of colon: Secondary | ICD-10-CM

## 2016-08-22 DIAGNOSIS — Z1211 Encounter for screening for malignant neoplasm of colon: Secondary | ICD-10-CM | POA: Diagnosis not present

## 2016-08-22 MED ORDER — SODIUM CHLORIDE 0.9 % IV SOLN: 500.0000 mL | INTRAVENOUS | Status: DC

## 2016-08-22 NOTE — Op Note (Signed)
[place] Patient Name: Martinez Boxx Procedure Date: 08/22/2016 4:07 PM MRN: [id number] Endoscopist: Ladene Artist , MD Age: 66 Referring MD:  Date of Birth: 23-Mar-1950 Gender: Male Account #: [id number] Procedure:                Upper GI endoscopy Indications:              Dysphagia Medicines:                Monitored Anesthesia Care Procedure:                Pre-Anesthesia Assessment:                           - Prior to the procedure, a History and Physical                            was performed, and patient medications and                            allergies were reviewed. The patient's tolerance of                            previous anesthesia was also reviewed. The risks                            and benefits of the procedure and the sedation                            options and risks were discussed with the patient.                            All questions were answered, and informed consent                            was obtained. Prior Anticoagulants: The patient has                            taken no previous anticoagulant or antiplatelet                            agents. ASA Grade Assessment: II - A patient with                            mild systemic disease. After reviewing the risks                            and benefits, the patient was deemed in                            satisfactory condition to undergo the procedure.                           After obtaining informed consent, the endoscope was  passed under direct vision. Throughout the                            procedure, the patient's blood pressure, pulse, and                            oxygen saturations were monitored continuously. The                            Model GIF-[id number] scope was introduced                            through the mouth, and advanced to the second part                            of duodenum. The upper GI endoscopy was                       accomplished without difficulty. The patient                            tolerated the procedure well. Scope In: Scope Out: Findings:                 No endoscopic abnormality was evident in the                            esophagus to explain the patient's complaint of                            dysphagia. It was decided, however, to proceed with                            dilation of the entire esophagus. A guidewire was                            placed and the scope was withdrawn. Dilation was                            performed with a Savary dilator with no resistance                            at 16 mm.                           A small hiatal hernia was present.                           The exam of the stomach was otherwise normal.                           The duodenal bulb and second portion of the                            duodenum were normal. Complications:  No immediate complications. Estimated Blood Loss:     Estimated blood loss: none. Impression:               - No endoscopic esophageal abnormality to explain                            patient's dysphagia. Esophagus dilated. Dilated.                           - Small hiatal hernia.                           - Normal duodenal bulb and second portion of the                            duodenum.                           - No specimens collected. Recommendation:           - Clear liquid diet for 2 hours, then advance as                            tolerated to soft diet today. Resume prior diet                            tomorrow.                           - Continue present medications.                           - If dysphagia persists consider esophageal                            manometry. Ladene Artist, MD 08/22/2016 4:49:16 PM This report has been signed electronically.

## 2016-08-22 NOTE — Patient Instructions (Signed)
Handouts given on polyps, diverticulosis, hemorrhoids, Esophagitis, Stricture and Post Dilation diet   YOU HAD AN ENDOSCOPIC PROCEDURE TODAY: Refer to the procedure report and other information in the discharge instructions given to you for any specific questions about what was found during the examination. If this information does not answer your questions, please call [facility] office at [phone] to clarify.   YOU SHOULD EXPECT: Some feelings of bloating in the abdomen. Passage of more gas than usual. Walking can help get rid of the air that was put into your GI tract during the procedure and reduce the bloating. If you had a lower endoscopy (such as a colonoscopy or flexible sigmoidoscopy) you may notice spotting of blood in your stool or on the toilet paper. Some abdominal soreness may be present for a day or two, also.  DIET: Your first meal following the procedure should be a light meal and then it is ok to progress to your normal diet. A half-sandwich or bowl of soup is an example of a good first meal. Heavy or fried foods are harder to digest and may make you feel nauseous or bloated. Drink plenty of fluids but you should avoid alcoholic beverages for 24 hours. If you had a esophageal dilation, please see attached instructions for diet.    ACTIVITY: Your care partner should take you home directly after the procedure. You should plan to take it easy, moving slowly for the rest of the day. You can resume normal activity the day after the procedure however YOU SHOULD NOT DRIVE, use power tools, machinery or perform tasks that involve climbing or major physical exertion for 24 hours (because of the sedation medicines used during the test).   SYMPTOMS TO REPORT IMMEDIATELY: A gastroenterologist can be reached at any hour. Please call [phone]  for any of the following symptoms:  Following lower endoscopy (colonoscopy, flexible sigmoidoscopy) Excessive amounts of blood in the stool   Significant tenderness, worsening of abdominal pains  Swelling of the abdomen that is new, acute  Fever of 100 or higher  Following upper endoscopy (EGD, EUS, ERCP, esophageal dilation) Vomiting of blood or coffee ground material  New, significant abdominal pain  New, significant chest pain or pain under the shoulder blades  Painful or persistently difficult swallowing  New shortness of breath  Black, tarry-looking or red, bloody stools  FOLLOW UP:  If any biopsies were taken you will be contacted by phone or by letter within the next 1-3 weeks. Call [phone]  if you have not heard about the biopsies in 3 weeks.  Please also call with any specific questions about appointments or follow up tests.

## 2016-08-22 NOTE — Op Note (Signed)
[facility] Patient Name: [name] Procedure Date: 08/22/2016 4:07 PM MRN: [id number] Endoscopist: Ladene Artist , MD Age: 66 Referring MD:  Date of Birth: [birth year] Gender: Male Account #: [id number] Procedure:                Colonoscopy Indications:              Screening for colorectal malignant neoplasm Medicines:                Monitored Anesthesia Care Procedure:                Pre-Anesthesia Assessment:                           - Prior to the procedure, a History and Physical                            was performed, and patient medications and                            allergies were reviewed. The patient's tolerance of                            previous anesthesia was also reviewed. The risks                            and benefits of the procedure and the sedation                            options and risks were discussed with the patient.                            All questions were answered, and informed consent                            was obtained. Prior Anticoagulants: The patient has                            taken no previous anticoagulant or antiplatelet                            agents. ASA Grade Assessment: II - A patient with                            mild systemic disease. After reviewing the risks                            and benefits, the patient was deemed in                            satisfactory condition to undergo the procedure.                           After obtaining informed consent, the colonoscope  was passed under direct vision. Throughout the                            procedure, the patient's blood pressure, pulse, and                            oxygen saturations were monitored continuously. The                            Colonoscope was introduced through the anus and                            advanced to the the cecum, identified by                            appendiceal orifice and  ileocecal valve. The                            ileocecal valve, appendiceal orifice, and rectum                            were photographed. The quality of the bowel                            preparation was excellent. The colonoscopy was                            performed without difficulty. The patient tolerated                            the procedure well. Scope In: 4:20:26 PM Scope Out: 4:34:21 PM Scope Withdrawal Time: 0 hours 11 minutes 6 seconds  Total Procedure Duration: 0 hours 13 minutes 55 seconds  Findings:                 The perianal and digital rectal examinations were                            normal.                           A 6 mm polyp was found in the sigmoid colon. The                            polyp was sessile. The polyp was removed with a                            cold snare. Resection and retrieval were complete.                           A few medium-mouthed diverticula were found in the                            sigmoid colon. There was no evidence of  diverticular bleeding.                           Internal hemorrhoids were found during                            retroflexion. The hemorrhoids were small and Grade                            I (internal hemorrhoids that do not prolapse).                           The exam was otherwise without abnormality on                            direct and retroflexion views. Complications:            No immediate complications. Estimated blood loss:                            None. Estimated Blood Loss:     Estimated blood loss: none. Impression:               - One 6 mm polyp in the sigmoid colon, removed with                            a cold snare. Resected and retrieved.                           - Mild diverticulosis in the sigmoid colon. There                            was no evidence of diverticular bleeding.                           - Internal hemorrhoids.                            - The examination was otherwise normal on direct                            and retroflexion views. Recommendation:           - Repeat colonoscopy in 5 years for surveillance if                            polyp is precancerous, otherwise 10 years for                            screening.                           - Patient has a contact number available for                            emergencies. The signs and symptoms of potential  delayed complications were discussed with the                            patient. Return to normal activities tomorrow.                            Written discharge instructions were provided to the                            patient.                           - Resume previous diet.                           - Continue present medications.                           - Await pathology results. Ladene Artist, MD 08/22/2016 4:37:12 PM This report has been signed electronically.

## 2016-08-22 NOTE — Progress Notes (Signed)
Called to room to assist during endoscopic procedure.  Patient ID and intended procedure confirmed with present staff. Received instructions for my participation in the procedure from the performing physician.  

## 2016-08-22 NOTE — Progress Notes (Signed)
Patient awakening,vss,report to rn 

## 2016-08-23 ENCOUNTER — Telehealth: Payer: Self-pay | Admitting: *Deleted

## 2016-08-23 NOTE — Telephone Encounter (Signed)
  Follow up Call-  Call back number 08/22/2016  Post procedure Call Back phone  # [phone]  Permission to leave phone message Yes  Some recent data might be hidden     Patient questions:  Do you have a fever, pain , or abdominal swelling? No. Pain Score  0 *  Have you tolerated food without any problems? Yes.    Have you been able to return to your normal activities? Yes.    Do you have any questions about your discharge instructions: Diet   No. Medications  No. Follow up visit  No.  Do you have questions or concerns about your Care? No.  Actions: * If pain score is 4 or above: No action needed, pain <4.

## 2016-09-02 ENCOUNTER — Encounter: Payer: Self-pay | Admitting: Gastroenterology

## 2016-12-05 ENCOUNTER — Ambulatory Visit: Payer: Medicaid Other | Admitting: Pulmonary Disease

## 2016-12-05 ENCOUNTER — Encounter: Payer: Self-pay | Admitting: Pulmonary Disease

## 2016-12-05 VITALS — BP 124/78 | HR 63 | Ht 68.0 in | Wt 170.1 lb

## 2016-12-05 DIAGNOSIS — J069 Acute upper respiratory infection, unspecified: Secondary | ICD-10-CM | POA: Insufficient documentation

## 2016-12-05 DIAGNOSIS — J439 Emphysema, unspecified: Secondary | ICD-10-CM | POA: Diagnosis not present

## 2016-12-05 DIAGNOSIS — F17210 Nicotine dependence, cigarettes, uncomplicated: Secondary | ICD-10-CM | POA: Diagnosis not present

## 2016-12-05 DIAGNOSIS — F17200 Nicotine dependence, unspecified, uncomplicated: Secondary | ICD-10-CM

## 2016-12-05 MED ORDER — PREDNISONE 20 MG PO TABS: 20.0000 mg | ORAL_TABLET | Freq: Every day | ORAL | 0 refills | Status: DC

## 2016-12-05 MED ORDER — AMOXICILLIN-POT CLAVULANATE 875-125 MG PO TABS: 1.0000 | ORAL_TABLET | Freq: Two times a day (BID) | ORAL | 0 refills | Status: DC

## 2016-12-05 MED ORDER — ALBUTEROL SULFATE HFA 108 (90 BASE) MCG/ACT IN AERS: 2.0000 | INHALATION_SPRAY | Freq: Four times a day (QID) | RESPIRATORY_TRACT | 6 refills | Status: DC | PRN

## 2016-12-05 NOTE — Patient Instructions (Addendum)
   Continue using your inhalers and medications as prescribed.  Call my office if you aren't starting to get better after a couple of days.  We will see you back in 3 months or sooner if needed.

## 2016-12-05 NOTE — Progress Notes (Signed)
Subjective:    Patient ID: [name], male    DOB: 1950-12-09, 66 y.o.   MRN: [id number]  C.C.:  Follow-up for Pulmonary Emphysema, Hemoptysis, GERD, & Tobacco Use Disorder.  HPI He reports he was exposed to a grandchild with strep throat about 1.5 weeks ago. Since then he has been sick. He reports it started with a sore throat that has resolved. Since then he has had significant sinus congestion but no facial pain.  Pulmonary emphysema: Restarted Symbicort at last appointment. He denies any wheezing. He has only rarely needed his ProAir rescue inhaler.   Hemoptysis: Previously resolved. No parenchymal abnormality on chest imaging. Sputum culture negative. Still has no hemoptysis.  GERD: Status post esophageal dilation November 2016. Referred to local GI specialist. Previously prescribed Nexium. He reports no reflux or dyspepsia.   Tobacco use disorder: Patient was previously smoking anywhere from 0.5-1 pack per day of cigarettes. Admitted that he smokes less than the Summer and more when he is inactive.   Review of Systems He has had hot & cold chills. No fever or sweats. No chest pain, pressure or tightness.   Allergies  Allergen Reactions  . Benazepril Hcl Other (See Comments)    Can't breathe  . Pantoprazole Sodium Other (See Comments)    Can't breathe  . Amlodipine Swelling    Current Outpatient Medications on File Prior to Visit  Medication Sig Dispense Refill  . albuterol (PROVENTIL) (2.5 MG/3ML) 0.083% nebulizer solution Take 2.5 mg by nebulization every 6 (six) hours as needed. Shortness of breath    . aspirin 81 MG tablet Take 81 mg by mouth daily.     . budesonide-formoterol (SYMBICORT) 160-4.5 MCG/ACT inhaler Inhale 2 puffs into the lungs 2 (two) times daily. 1 Inhaler 6  . esomeprazole (NEXIUM) 40 MG capsule TAKE 1 CAPSULE (40 MG TOTAL) BY MOUTH 2 (TWO) TIMES DAILY BEFORE A MEAL. 60 capsule 5  . losartan-hydrochlorothiazide (HYZAAR) 100-12.5 MG tablet Take  one-half tablet daily by mouth (Patient taking differently: Take by mouth daily. Take one-half tablet daily by mouth) 30 tablet 3  . Oxycodone HCl 20 MG TABS Take 20 mg by mouth every 4 (four) hours.    Marland Kitchen PROAIR HFA 108 (90 Base) MCG/ACT inhaler INHALE 2 PUFFS EVERY 6 HOURS AS NEEDED 8.5 Inhaler 2  . Spacer/Aero-Holding Chambers (AEROCHAMBER MV) inhaler Use as instructed 1 each 0   Current Facility-Administered Medications on File Prior to Visit  Medication Dose Route Frequency Provider Last Rate Last Dose  . 0.9 %  sodium chloride infusion  500 mL Intravenous Continuous Ladene Artist, MD        Past Medical History:  Diagnosis Date  . AAA (abdominal aortic aneurysm) ([place])    a. s/p EVAR 05/2010.  . Back pain   . CKD (chronic kidney disease), stage II   . Degenerative disk disease    a. Chronic back pain with ridiculopathy.  . Diverticulosis   . Emphysema of lung ([place])   . Essential hypertension   . GERD (gastroesophageal reflux disease)   . Hiatal hernia   . Hyperlipidemia   . 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 Pulmonary nodule    a. 8mm nodule seen on CT 09/2014 - f/u recommended 1 yr.  . Skin cancer   . Tobacco abuse     Past Surgical History:  Procedure Laterality Date  . ABDOMINAL AORTIC ENDOVASCULAR STENT GRAFT  2014  . APPENDECTOMY    .  BACK SURGERY    . EVAR  06/20/10   for AAA repair   . SPINAL FUSION     of neck and lower back    Family History  Problem Relation Age of Onset  . Atrial fibrillation Mother   . Heart disease Mother        Patient unclear of what kind  . Deep vein thrombosis Mother   . Hyperlipidemia Mother   . Hypertension Mother   . Stroke Father   . Diabetes Father   . Heart disease Father        Patient unclear of what kind  . Kidney disease Father   . Hyperlipidemia Father   . Hypertension Father   . Diabetes Sister   . Heart disease Sister        Patient unclear of what kind  . Brain cancer  Sister   . Seizures Sister   . Deep vein thrombosis Other   . Pulmonary embolism Other   . Cancer Paternal Grandmother   . Lung disease Neg Hx   . Colon cancer Neg Hx   . Esophageal cancer Neg Hx   . Rectal cancer Neg Hx   . Stomach cancer Neg Hx     Social History   Socioeconomic History  . Marital status: Married    Spouse name: None  . Number of children: None  . Years of education: None  . Highest education level: None  Social Needs  . Financial resource strain: None  . Food insecurity - worry: None  . Food insecurity - inability: None  . Transportation needs - medical: None  . Transportation needs - non-medical: None  Occupational History  . None  Tobacco Use  . Smoking status: Current Every Day Smoker    Packs/day: 1.00    Years: 48.00    Pack years: 48.00    Types: Cigarettes    Start date: 12/16/1965  . Smokeless tobacco: Never Used  Substance and Sexual Activity  . Alcohol use: No    Alcohol/week: 0.0 oz  . Drug use: No  . Sexual activity: None  Other Topics Concern  . None  Social History Narrative   Originally from [place], [place]. Always lived in [place]. Travel to the [place] in March 2016. Has traveled to [place], [place], [place], & [place]. 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 No known asbestos exposure. Has 2 parakeets currently. Remotely raised parakeets, love birds, & cockatiels in a different house. No known mold or hot tub exposure.       Objective:   Physical Exam BP 124/78 (BP Location: Left Arm, Cuff Size: Normal)   Pulse 63   Ht 5\' 8"  (1.727 m)   Wt 170 lb 2 oz (77.2 kg)   SpO2 96%   BMI 25.87 kg/m    General:  Awake. No distress. Comfortable. Integument:  Warm. Dry. No rash. Extremities:  No cyanosis or clubbing.  HEENT:  Moderate to severe bilateral nasal turbinate swelling with erythematous mucosa. Minimal wax impaction adjacent right tympanic membrane. No oral  ulcers. Moist mucous membranes. Cardiovascular:  Regular rate. No edema. Regular rhythm.  Pulmonary:  Prolonged exhalation phase. Faint end expiratory wheeze that is very brief. Intermittent cough witnessed throughout exam. Abdomen: Soft. Normal bowel sounds. Nondistended.  Musculoskeletal:  Normal bulk and tone. No joint effusion appreciated. Neurological:  Cranial nerves 2-12 grossly in tact. No meningismus. Moving all 4 extremities  equally.   PFT 05/17/15: FVC 3.16 L (82%) FEV1 2.74 L (83%) FEV1/FVC 0.76 FEF 25-75 2.19 L (82%)                                                                                                               DLCO corrected 58% 12/31/14: FVC 3.15 L (72%) FEV1 2.59 L (78%) FEV1/FVC 0.82 FEF 25-75 2.74 L (101%) no bronchodilator response TLC 5.34 L (80%) RV 67% ERV 69% DLCO uncorrected 61%  6MWT 12/31/14:  Walked 326 meters / Baseline Sat 96% on RA / Nadir Sat 96% on RA  IMAGING CXR PA/LAT 02/27/16 (previously reviewed by me):  No parenchymal nodule or opacity appreciated. No pleural effusion. Heart normal in size & mediastinum normal in contour.  ESOPHAGRAM/BARIUM SWALLOW 03/01/16 (per radiologist):  Mild esophageal dysmotility, likely presbyesophagus. Small hiatal hernia. Similar appearance of a mild stricture at the gastroesophageal junction. Delayed passage of 13 mm barium tablet, likely secondary to the above issues.  CTA ABDOMEN/PELVIS 01/25/16 (lung windows previously reviewed by me): Well bibasilar atelectasis. No parenchymal nodule or opacity appreciated within limited lung windows. No pleural effusion or thickening appreciated. Esophageal thickening noted.  CT CHEST W/O 10/18/15 (previously reviewed by me):  76mm left upper lobe nodule unchanged and now mostly calcified. No new nodule or opacity. Borderline pre-carinal adenopathy. No pleural effusion or thickening. No pericardial effusion.   CT CHEST W/O 03/31/15 (previously reviewed by me): 3 mm left upper lobe  nodule unchanged. No new nodule appreciated. No pleural effusion or thickening. No pathologic mediastinal adenopathy. No pericardial effusion.  BARIUM SWALLOW (11/02/14): Small hiatal hernia. No GERD elicited. 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 No mass or ulcer seen. Mild esophageal dysmotility & mild cricopharyngeus muscle dysfunction c/w chronic GERD.  CTA CHEST 10/13/14 (previously reviewed by me): 3 mm left upper lobe nodule noted. No other nodule or opacity appreciated. Trace bilateral pleural effusions no appreciated pleural thickening. Dependent atelectasis noted bilaterally. Upper lobe predominant emphysematous changes with subpleural bleb noted within the left upper lobe apex. No pericardial effusion. No pulmonary embolus. Small hiatal hernia noted.  PATHOLOGY EGD BIOPSY (12/03/14): Barrett's Esophagus without dysplasia.  MICROBIOLOGY Sputum Culture (03/02/16): Normal oral flora/AFB negative/light growth of yeast with multiple morphologic types Sputum Culture (11/10/15):  Normal oral flora  LABS 10/29/14 ALPHA-1 AT: MM (145)  10/20/14 BMP: 140/4.3/103/24/14/1.27/93/9.2    Assessment & Plan:  66 y.o. male with chronic and ongoing tobacco use disorder as well as underlying pulmonary emphysema. His reflux is well controlled. Suspect his initial illness was viral in etiology but this is likely progressing to a bacterial infection and sinusitis. As such, I am prescribing the patient a course of antibiotics. I instructed the patient to contact my office if he had any new breathing problems or questions before his next appointment.  1. Acute URI: Treating with Augmentin 875 mg twice a day 7 days. 2. Pulmonary emphysema: Continuing Symbicort and albuterol inhaler. Treating with prednisone 40 mg daily 4 days. 3. GERD: Controlled.  Continuing the Nexium. 4. Tobacco use disorder: Patient counseled for over 3 minutes on the need for complete tobacco  cessation. 5. Health maintenance: Previously declined vaccination. 6. Follow-up: Return to clinic in 3 months or sooner if needed.  Sonia Baller Ashok Cordia, M.D. [facility] Pulmonary & Critical Care Pager:  [phone] After 3pm or if no response, call [phone] 10:09 AM 12/05/16

## 2017-01-18 ENCOUNTER — Other Ambulatory Visit (INDEPENDENT_AMBULATORY_CARE_PROVIDER_SITE_OTHER): Payer: Self-pay | Admitting: *Deleted

## 2017-01-18 MED ORDER — ESOMEPRAZOLE MAGNESIUM 40 MG PO CPDR: DELAYED_RELEASE_CAPSULE | ORAL | 5 refills | Status: DC

## 2017-01-20 ENCOUNTER — Other Ambulatory Visit (INDEPENDENT_AMBULATORY_CARE_PROVIDER_SITE_OTHER): Payer: Self-pay | Admitting: Internal Medicine

## 2017-02-03 ENCOUNTER — Emergency Department (HOSPITAL_COMMUNITY): Payer: Medicaid Other

## 2017-02-03 ENCOUNTER — Observation Stay (HOSPITAL_COMMUNITY)
Admission: EM | Admit: 2017-02-03 | Discharge: 2017-02-04 | Disposition: A | Discharge status: Home / Self Care | Payer: Medicaid Other | Attending: Internal Medicine | Admitting: Internal Medicine

## 2017-02-03 ENCOUNTER — Encounter (HOSPITAL_COMMUNITY): Payer: Self-pay | Admitting: *Deleted

## 2017-02-03 ENCOUNTER — Other Ambulatory Visit: Payer: Self-pay

## 2017-02-03 DIAGNOSIS — R079 Chest pain, unspecified: Secondary | ICD-10-CM | POA: Diagnosis present

## 2017-02-03 DIAGNOSIS — I10 Essential (primary) hypertension: Secondary | ICD-10-CM | POA: Diagnosis present

## 2017-02-03 DIAGNOSIS — I129 Hypertensive chronic kidney disease with stage 1 through stage 4 chronic kidney disease, or unspecified chronic kidney disease: Secondary | ICD-10-CM | POA: Diagnosis not present

## 2017-02-03 DIAGNOSIS — F17210 Nicotine dependence, cigarettes, uncomplicated: Secondary | ICD-10-CM | POA: Diagnosis present

## 2017-02-03 DIAGNOSIS — Z7951 Long term (current) use of inhaled steroids: Secondary | ICD-10-CM | POA: Insufficient documentation

## 2017-02-03 DIAGNOSIS — Z7982 Long term (current) use of aspirin: Secondary | ICD-10-CM | POA: Insufficient documentation

## 2017-02-03 DIAGNOSIS — R1310 Dysphagia, unspecified: Secondary | ICD-10-CM

## 2017-02-03 DIAGNOSIS — Z7952 Long term (current) use of systemic steroids: Secondary | ICD-10-CM | POA: Diagnosis not present

## 2017-02-03 DIAGNOSIS — Z79899 Other long term (current) drug therapy: Secondary | ICD-10-CM | POA: Insufficient documentation

## 2017-02-03 DIAGNOSIS — I2510 Atherosclerotic heart disease of native coronary artery without angina pectoris: Secondary | ICD-10-CM | POA: Insufficient documentation

## 2017-02-03 DIAGNOSIS — N182 Chronic kidney disease, stage 2 (mild): Secondary | ICD-10-CM | POA: Diagnosis not present

## 2017-02-03 DIAGNOSIS — I2584 Coronary atherosclerosis due to calcified coronary lesion: Secondary | ICD-10-CM | POA: Diagnosis not present

## 2017-02-03 DIAGNOSIS — Z79891 Long term (current) use of opiate analgesic: Secondary | ICD-10-CM | POA: Insufficient documentation

## 2017-02-03 DIAGNOSIS — K219 Gastro-esophageal reflux disease without esophagitis: Secondary | ICD-10-CM | POA: Diagnosis not present

## 2017-02-03 DIAGNOSIS — Z1211 Encounter for screening for malignant neoplasm of colon: Secondary | ICD-10-CM

## 2017-02-03 DIAGNOSIS — R0789 Other chest pain: Principal | ICD-10-CM | POA: Diagnosis present

## 2017-02-03 DIAGNOSIS — E785 Hyperlipidemia, unspecified: Secondary | ICD-10-CM | POA: Insufficient documentation

## 2017-02-03 LAB — TSH: TSH: 0.756 u[IU]/mL (ref 0.350–4.500)

## 2017-02-03 LAB — BASIC METABOLIC PANEL
ANION GAP: 10 (ref 5–15)
BUN: 10 mg/dL (ref 6–20)
CALCIUM: 9.1 mg/dL (ref 8.9–10.3)
CO2: 24 mmol/L (ref 22–32)
Chloride: 105 mmol/L (ref 101–111)
Creatinine, Ser: 1.39 mg/dL — ABNORMAL HIGH (ref 0.61–1.24)
GFR calc Af Amer: >60 mL/min (ref >60)
GFR calc non Af Amer: 52 mL/min — ABNORMAL LOW (ref >60)
GLUCOSE: 115 mg/dL — AB (ref 65–99)
Potassium: 3.9 mmol/L (ref 3.5–5.1)
Sodium: 139 mmol/L (ref 135–145)

## 2017-02-03 LAB — TROPONIN I: Troponin I: <0.03 ng/mL (ref <0.03)

## 2017-02-03 LAB — I-STAT TROPONIN, ED: TROPONIN I, POC: 0 ng/mL (ref 0.00–0.08)

## 2017-02-03 LAB — GLUCOSE, CAPILLARY: Glucose-Capillary: 127 mg/dL — ABNORMAL HIGH (ref 65–99)

## 2017-02-03 LAB — HEMOGLOBIN A1C
HEMOGLOBIN A1C: 5.5 % (ref 4.8–5.6)
MEAN PLASMA GLUCOSE: 111.15 mg/dL

## 2017-02-03 LAB — CBC
HEMATOCRIT: 44.5 % (ref 39.0–52.0)
HEMOGLOBIN: 15.3 g/dL (ref 13.0–17.0)
MCH: 30.4 pg (ref 26.0–34.0)
MCHC: 34.4 g/dL (ref 30.0–36.0)
MCV: 88.5 fL (ref 78.0–100.0)
Platelets: 255 10*3/uL (ref 150–400)
RBC: 5.03 MIL/uL (ref 4.22–5.81)
RDW: 13.2 % (ref 11.5–15.5)
WBC: 11.1 10*3/uL — ABNORMAL HIGH (ref 4.0–10.5)

## 2017-02-03 LAB — MRSA PCR SCREENING: MRSA by PCR: NEGATIVE

## 2017-02-03 MED ORDER — ENOXAPARIN SODIUM 40 MG/0.4ML ~~LOC~~ SOLN
40.0000 mg | SUBCUTANEOUS | Status: DC
  Administered 2017-02-03: 40 mg via SUBCUTANEOUS
  Filled 2017-02-03: qty 0.4

## 2017-02-03 MED ORDER — OXYCODONE HCL 5 MG PO TABS
20.0000 mg | ORAL_TABLET | Freq: Four times a day (QID) | ORAL | Status: DC | PRN
  Administered 2017-02-04: 20 mg via ORAL
  Filled 2017-02-03: qty 4

## 2017-02-03 MED ORDER — NICOTINE 14 MG/24HR TD PT24: 14.0000 mg | MEDICATED_PATCH | Freq: Every day | TRANSDERMAL | Status: DC | PRN

## 2017-02-03 MED ORDER — ALUM HYDROXIDE-MAG CARBONATE 95-358 MG/15ML PO SUSP
15.0000 mL | ORAL | Status: DC | PRN
  Filled 2017-02-03: qty 15

## 2017-02-03 MED ORDER — NICOTINE POLACRILEX 2 MG MT GUM
2.0000 mg | CHEWING_GUM | OROMUCOSAL | Status: DC | PRN
  Filled 2017-02-03: qty 1

## 2017-02-03 MED ORDER — FLUTICASONE FUROATE-VILANTEROL 200-25 MCG/INH IN AEPB
1.0000 | INHALATION_SPRAY | Freq: Every day | RESPIRATORY_TRACT | Status: DC
  Filled 2017-02-03: qty 28

## 2017-02-03 MED ORDER — SODIUM CHLORIDE 0.9 % IV SOLN: 500.0000 mL | INTRAVENOUS | Status: DC

## 2017-02-03 MED ORDER — SODIUM CHLORIDE 0.9% FLUSH
3.0000 mL | Freq: Two times a day (BID) | INTRAVENOUS | Status: DC
  Administered 2017-02-03 – 2017-02-04 (×2): 3 mL via INTRAVENOUS

## 2017-02-03 MED ORDER — ASPIRIN EC 81 MG PO TBEC
81.0000 mg | DELAYED_RELEASE_TABLET | Freq: Every day | ORAL | Status: DC
  Administered 2017-02-04: 81 mg via ORAL
  Filled 2017-02-03 (×4): qty 1

## 2017-02-03 MED ORDER — LOSARTAN POTASSIUM 50 MG PO TABS
50.0000 mg | ORAL_TABLET | Freq: Every day | ORAL | Status: DC
  Administered 2017-02-04: 50 mg via ORAL
  Filled 2017-02-03: qty 1

## 2017-02-03 MED ORDER — FAMOTIDINE 20 MG PO TABS
40.0000 mg | ORAL_TABLET | Freq: Two times a day (BID) | ORAL | Status: DC
  Administered 2017-02-03 – 2017-02-04 (×2): 40 mg via ORAL
  Filled 2017-02-03 (×2): qty 2

## 2017-02-03 NOTE — ED Provider Notes (Signed)
[facility] EMERGENCY DEPARTMENT Provider Note   CSN: [id number] Arrival date & time: 02/03/17  1352     History   Chief Complaint Chief Complaint  Patient presents with  . Chest Pain    HPI DRYSTAN READER is a 67 y.o. male.  67 year old male with prior history of CKD, chronic back pain, AAA (status post repair), hyperlipidemia, and tobacco abuse presents with complaint of chest pain.  Patient reports that yesterday evening he started having left-sided chest discomfort.  This pain was intermittent over the course of last night.  This morning he noted some mild discomfort radiating to his left arm.  At this point his wife convinced him to come to the ED.  Upon arrival to the ED his pain is improved.  He denies prior history of CAD.  He does not believe that he has ever had a cardiac catheterization before.   The history is provided by the patient.  Chest Pain   This is a new problem. The current episode started 12 to 24 hours ago. The problem occurs rarely. The problem has been resolved. Associated with: not associated with movement, lifting  The pain is present in the lateral region. The patient is experiencing no pain. The quality of the pain is described as pressure-like. The pain radiates to the left arm. Duration of episode(s) is 30 minutes. Pertinent negatives include no abdominal pain, no back pain, no dizziness, no fever and no shortness of breath.    Past Medical History:  Diagnosis Date  . AAA (abdominal aortic aneurysm) ([place])    a. s/p EVAR 05/2010.  . Back pain   . CKD (chronic kidney disease), stage II   . Degenerative disk disease    a. Chronic back pain with ridiculopathy.  . Diverticulosis   . Emphysema of lung ([place])   . Essential hypertension   . GERD (gastroesophageal reflux disease)   . Hiatal hernia   . Hyperlipidemia   . 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 Pulmonary nodule    a. 51mm nodule seen on CT 09/2014  - f/u recommended 1 yr.  . Skin cancer   . Tobacco abuse     Patient Active Problem List   Diagnosis Date Noted  . Acute URI 12/05/2016  . Cough 11/09/2015  . Asthmatic bronchitis with acute exacerbation 08/03/2015  . Tobacco use disorder 05/17/2015  . Emphysema of lung ([place]) 10/29/2014  . CKD (chronic kidney disease), stage II   . Pulmonary nodule   . Dysphagia 05/07/2013  . GERD (gastroesophageal reflux disease) 05/07/2013  . Chest pain 09/06/2012  . Abdominal aneurysm without mention of rupture 10/20/2010  . Preop cardiovascular exam 06/01/2010  . Cigarette smoker 06/01/2010  . AAA (abdominal aortic aneurysm) ([place]) 06/01/2010  . HTN (hypertension) 06/01/2010    Past Surgical History:  Procedure Laterality Date  . ABDOMINAL AORTIC ENDOVASCULAR STENT GRAFT  2014  . APPENDECTOMY    . BACK SURGERY    . BIOPSY N/A 06/10/2013   Procedure: BIOPSY;  Surgeon: Rogene Houston, MD;  Location: [facility];  Service: Endoscopy;  Laterality: N/A;  . BIOPSY  12/03/2014   Procedure: BIOPSY (Gastroesophageal Junction);  Surgeon: Rogene Houston, MD;  Location: [facility];  Service: Endoscopy;;  . ESOPHAGEAL DILATION N/A 12/03/2014   Procedure: ESOPHAGEAL DILATION WITH 54FR AND 56FR MALONEY;  Surgeon: Rogene Houston, MD;  Location: [facility];  Service: Endoscopy;  Laterality: N/A;  . ESOPHAGOGASTRODUODENOSCOPY (  EGD) WITH PROPOFOL N/A 06/10/2013   Procedure: ESOPHAGOGASTRODUODENOSCOPY (EGD) WITH PROPOFOL;  Surgeon: Rogene Houston, MD;  Location: [facility];  Service: Endoscopy;  Laterality: N/A;  . ESOPHAGOGASTRODUODENOSCOPY (EGD) WITH PROPOFOL N/A 12/03/2014   Procedure: ESOPHAGOGASTRODUODENOSCOPY (EGD) WITH PROPOFOL;  Surgeon: Rogene Houston, MD;  Location: [facility];  Service: Endoscopy;  Laterality: N/A;  . EVAR  06/20/10   for AAA repair   . MALONEY DILATION N/A 06/10/2013   Procedure: MALONEY DILATION 52 fr, 81 fr;  Surgeon: Rogene Houston, MD;  Location: [facility];  Service: Endoscopy;  Laterality: N/A;    . SPINAL FUSION     of neck and lower back       Home Medications    Prior to Admission medications   Medication Sig Start Date End Date Taking? Authorizing Provider  albuterol (PROAIR HFA) 108 (90 Base) MCG/ACT inhaler Inhale 2 puffs every 6 (six) hours as needed into the lungs. 12/05/16   Javier Glazier, MD  albuterol (PROVENTIL) (2.5 MG/3ML) 0.083% nebulizer solution Take 2.5 mg by nebulization every 6 (six) hours as needed. Shortness of breath    [provider]  amoxicillin-clavulanate (AUGMENTIN) 875-125 MG tablet Take 1 tablet 2 (two) times daily by mouth. 12/05/16   Javier Glazier, MD  aspirin 81 MG tablet Take 81 mg by mouth daily.     [provider]  budesonide-formoterol (SYMBICORT) 160-4.5 MCG/ACT inhaler Inhale 2 puffs into the lungs 2 (two) times daily. 06/04/16   Javier Glazier, MD  esomeprazole (NEXIUM) 40 MG capsule TAKE 1 CAPSULE (40 MG TOTAL) BY MOUTH 2 (TWO) TIMES DAILY BEFORE A MEAL. 01/18/17   Rehman, Mechele Dawley, MD  losartan-hydrochlorothiazide (HYZAAR) 100-12.5 MG tablet Take one-half tablet daily by mouth Patient taking differently: Take by mouth daily. Take one-half tablet daily by mouth 12/30/14   Sherren Mocha, MD  Oxycodone HCl 20 MG TABS Take 20 mg by mouth every 4 (four) hours.    [provider]  predniSONE (DELTASONE) 20 MG tablet Take 1 tablet (20 mg total) daily with breakfast by mouth. 12/05/16   Javier Glazier, MD  Spacer/Aero-Holding Chambers (AEROCHAMBER MV) inhaler Use as instructed 11/09/15   Javier Glazier, MD    Family History Family History  Problem Relation Age of Onset  . Atrial fibrillation Mother   . Heart disease Mother        Patient unclear of what kind  . Deep vein thrombosis Mother   . Hyperlipidemia Mother   . Hypertension Mother   . Stroke Father   . Diabetes Father   . Heart disease Father        Patient unclear of what kind  . Kidney disease Father   . Hyperlipidemia Father    . Hypertension Father   . Diabetes Sister   . Heart disease Sister        Patient unclear of what kind  . Brain cancer Sister   . Seizures Sister   . Deep vein thrombosis Other   . Pulmonary embolism Other   . Cancer Paternal Grandmother   . Lung disease Neg Hx   . Colon cancer Neg Hx   . Esophageal cancer Neg Hx   . Rectal cancer Neg Hx   . Stomach cancer Neg Hx     Social History Social History   Tobacco Use  . Smoking status: Current Every Day Smoker    Packs/day: 1.00    Years: 48.00    Pack years: 48.00  Types: Cigarettes    Start date: 12/16/1965  . Smokeless tobacco: Never Used  Substance Use Topics  . Alcohol use: No    Alcohol/week: 0.0 oz  . Drug use: No     Allergies   Benazepril hcl; Pantoprazole sodium; and Amlodipine   Review of Systems Review of Systems  Constitutional: Negative for fever.  Respiratory: Negative for shortness of breath.   Cardiovascular: Positive for chest pain.  Gastrointestinal: Negative for abdominal pain.  Musculoskeletal: Negative for back pain.  Neurological: Negative for dizziness.  All other systems reviewed and are negative.    Physical Exam Updated Vital Signs BP (!) 147/94 (BP Location: Left Arm)   Pulse 68   Temp 98.1 F (36.7 C) (Oral)   Resp 18   Ht 5\' 9"  (1.753 m)   Wt 77.1 kg (170 lb)   SpO2 99%   BMI 25.10 kg/m   Physical Exam  Constitutional: He is oriented to person, place, and time. He appears well-developed and well-nourished. No distress.  HENT:  Head: Normocephalic and atraumatic.  Mouth/Throat: Oropharynx is clear and moist.  Eyes: Conjunctivae and EOM are normal. Pupils are equal, round, and reactive to light.  Neck: Normal range of motion. Neck supple.  Cardiovascular: Normal rate, regular rhythm and normal heart sounds.  Pulmonary/Chest: Effort normal and breath sounds normal. No respiratory distress.  Abdominal: Soft. He exhibits no distension. There is no tenderness.   Musculoskeletal: Normal range of motion. He exhibits no edema or deformity.  Neurological: He is alert and oriented to person, place, and time.  Skin: Skin is warm and dry.  Psychiatric: He has a normal mood and affect.  Nursing note and vitals reviewed.    ED Treatments / Results  Labs (all labs ordered are listed, but only abnormal results are displayed) Labs Reviewed  BASIC METABOLIC PANEL - Abnormal; Notable for the following components:      Result Value   Glucose, Bld 115 (*)    Creatinine, Ser 1.39 (*)    GFR calc non Af Amer 52 (*)    All other components within normal limits  CBC - Abnormal; Notable for the following components:   WBC 11.1 (*)    All other components within normal limits  I-STAT TROPONIN, ED    EKG  EKG Interpretation  Date/Time:  Sunday February 03 2017 13:55:06 EST Ventricular Rate:  82 PR Interval:  146 QRS Duration: 74 QT Interval:  390 QTC Calculation: 455 R Axis:   -46 Text Interpretation:  Normal sinus rhythm with sinus arrhythmia Left axis deviation Abnormal ECG Confirmed by Dene Gentry [phone]) on 02/03/2017 2:06:02 PM       Radiology Dg Chest 2 View  Result Date: 02/03/2017 CLINICAL DATA:  Left-sided chest pain since yesterday. Shortness of breath. Nausea and dizziness. EXAM: CHEST  2 VIEW COMPARISON:  02/27/2016 FINDINGS: Mild pectus excavatum deformity. Midline trachea. Normal heart size. Atherosclerosis in the transverse aorta. No pleural effusion or pneumothorax. Lower cervical spine fixation. Mild volume loss at the left lung base. Suspect mild interstitial lung disease in the subpleural lower lobes, similar. IMPRESSION: No acute cardiopulmonary disease. Aortic Atherosclerosis (ICD10-I70.0). Suspect subtle interstitial lung disease at the bases. Consider nonemergent outpatient high-resolution chest CT, especially if the patient has chronic shortness of breath. Electronically Signed   By: Abigail Miyamoto M.D.   On: 02/03/2017 14:21     Procedures Procedures (including critical care time)  Medications Ordered in ED Medications - No data to display   Initial  Impression / Assessment and Plan / ED Course  I have reviewed the triage vital signs and the nursing notes.  Pertinent labs & imaging results that were available during my care of the patient were reviewed by me and considered in my medical decision making (see chart for details).     MDM screen complete  Patient is presenting with chest pain.  Initial EKG and first troponin are reassuring.  Patient will be admitted for further rule out of ACS.  Patient is comfortable at time of admission.  Patient took aspirin at home prior to arrival in the ED.  1515  Internal medicine is aware of case and will evaluate for admission.    Final Clinical Impressions(s) / ED Diagnoses   Final diagnoses:  Chest pain, unspecified type    ED Discharge Orders    None       Valarie Merino, MD 02/03/17 [phone]

## 2017-02-03 NOTE — ED Notes (Signed)
Bed placement unable to room pt as acuity set to 'acute'. Paged MD to make aware and put in new order. Waiting for response.

## 2017-02-03 NOTE — ED Triage Notes (Addendum)
Pt states L sided chest and L lat chest pain since yesterday.  Today pain began radiating to L arm.  Also c/o increasing sob, nausea and dizziness.  147/94 on R arm 138/86 on L.

## 2017-02-03 NOTE — H&P (Signed)
Date: 02/03/2017               Patient Name:  [name] MRN: [id number]  DOB: April 08, 1950 Age / Sex: 67 y.o., male   PCP: Cory Munch, PA-C              Medical Service: Internal Medicine Teaching Service              Attending Physician: Dr. Lenice Pressman, MD    First Contact: Annye Asa, MS4 Pager: [phone]  Second Contact: Dr. Einar Gip Pager: [phone]            After Hours (After 5p/  First Contact Pager: [phone]  weekends / holidays): Second Contact Pager: [phone]   Chief Complaint: Chest Pain  History of Present Illness:  Pt is a 67yo M with a hx of COPD, AAA s/p stent placement (2012) Barrett's esophagus, hiatal hernia, and tobacco use who presented to the ED with 1 day of L-sided chest pain. The pt reports that he was sitting down yesterday when he began having shooting, sharp pains on the lateral aspect of his L chest and L arm. He then experienced a hot flash all over his body for about 20 minutes. His wife reports that he looked pale and clammy, and he was nauseous for about an hour but did not vomit.The pain would come and go, but he was able to sleep through the night after taking Gaviscon. When he woke up this morning, the lateral L chest pain had persisted. He did not notice that the pain was better or worse with exertion. The pt reports having a similar pain on his R side and lower extremities in the past, but it did not last as long. He father died from a heart attack in his 40s, and his mother had an arrhythmia that was diagnosed in her 42s.    Meds: Current Facility-Administered Medications  Medication Dose Route Frequency Provider Last Rate Last Dose  . 0.9 %  sodium chloride infusion  500 mL Intravenous Continuous Ladene Artist, MD       Current Outpatient Medications  Medication Sig Dispense Refill  . albuterol (PROAIR HFA) 108 (90 Base) MCG/ACT inhaler Inhale 2 puffs every 6 (six) hours as needed into the lungs. 1 Inhaler 6  . albuterol  (PROVENTIL) (2.5 MG/3ML) 0.083% nebulizer solution Take 2.5 mg by nebulization every 6 (six) hours as needed. Shortness of breath    . amoxicillin-clavulanate (AUGMENTIN) 875-125 MG tablet Take 1 tablet 2 (two) times daily by mouth. 14 tablet 0  . aspirin 81 MG tablet Take 81 mg by mouth daily.     . budesonide-formoterol (SYMBICORT) 160-4.5 MCG/ACT inhaler Inhale 2 puffs into the lungs 2 (two) times daily. 1 Inhaler 6  . esomeprazole (NEXIUM) 40 MG capsule TAKE 1 CAPSULE (40 MG TOTAL) BY MOUTH 2 (TWO) TIMES DAILY BEFORE A MEAL. 60 capsule 5  . losartan-hydrochlorothiazide (HYZAAR) 100-12.5 MG tablet Take one-half tablet daily by mouth (Patient taking differently: Take by mouth daily. Take one-half tablet daily by mouth) 30 tablet 3  . Oxycodone HCl 20 MG TABS Take 20 mg by mouth every 4 (four) hours.    . predniSONE (DELTASONE) 20 MG tablet Take 1 tablet (20 mg total) daily with breakfast by mouth. 4 tablet 0  . Spacer/Aero-Holding Chambers (AEROCHAMBER MV) inhaler Use as instructed 1 each 0    Allergies: Allergies as of 02/03/2017 - Review Complete 02/03/2017  Allergen Reaction Noted  . Benazepril hcl  Other (See Comments) 06/01/2010  . Pantoprazole sodium Other (See Comments) 06/01/2010  . Amlodipine Swelling 01/18/2015   Past Medical History:  Diagnosis Date  . AAA (abdominal aortic aneurysm) ([place])    a. s/p EVAR 05/2010.  . Back pain   . CKD (chronic kidney disease), stage II   . Degenerative disk disease    a. Chronic back pain with ridiculopathy.  . Diverticulosis   . Emphysema of lung ([place])   . Essential hypertension   . GERD (gastroesophageal reflux disease)   . Hiatal hernia   . Hyperlipidemia   . 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 Pulmonary nodule    a. 77mm nodule seen on CT 09/2014 - f/u recommended 1 yr.  . Skin cancer   . Tobacco abuse    Past Surgical History:  Procedure Laterality Date  . ABDOMINAL AORTIC ENDOVASCULAR STENT GRAFT   2014  . APPENDECTOMY    . BACK SURGERY    . BIOPSY N/A 06/10/2013   Procedure: BIOPSY;  Surgeon: Rogene Houston, MD;  Location: [facility];  Service: Endoscopy;  Laterality: N/A;  . BIOPSY  12/03/2014   Procedure: BIOPSY (Gastroesophageal Junction);  Surgeon: Rogene Houston, MD;  Location: [facility];  Service: Endoscopy;;  . ESOPHAGEAL DILATION N/A 12/03/2014   Procedure: ESOPHAGEAL DILATION WITH 54FR AND 56FR MALONEY;  Surgeon: Rogene Houston, MD;  Location: [facility];  Service: Endoscopy;  Laterality: N/A;  . ESOPHAGOGASTRODUODENOSCOPY (EGD) WITH PROPOFOL N/A 06/10/2013   Procedure: ESOPHAGOGASTRODUODENOSCOPY (EGD) WITH PROPOFOL;  Surgeon: Rogene Houston, MD;  Location: [facility];  Service: Endoscopy;  Laterality: N/A;  . ESOPHAGOGASTRODUODENOSCOPY (EGD) WITH PROPOFOL N/A 12/03/2014   Procedure: ESOPHAGOGASTRODUODENOSCOPY (EGD) WITH PROPOFOL;  Surgeon: Rogene Houston, MD;  Location: [facility];  Service: Endoscopy;  Laterality: N/A;  . EVAR  06/20/10   for AAA repair   . MALONEY DILATION N/A 06/10/2013   Procedure: MALONEY DILATION 52 fr, 7 fr;  Surgeon: Rogene Houston, MD;  Location: [facility];  Service: Endoscopy;  Laterality: N/A;  . SPINAL FUSION     of neck and lower back   Family History  Problem Relation Age of Onset  . Atrial fibrillation Mother   . Heart disease Mother        Patient unclear of what kind  . Deep vein thrombosis Mother   . Hyperlipidemia Mother   . Hypertension Mother   . Stroke Father   . Diabetes Father   . Heart disease Father        Patient unclear of what kind  . Kidney disease Father   . Hyperlipidemia Father   . Hypertension Father   . Diabetes Sister   . Heart disease Sister        Patient unclear of what kind  . Brain cancer Sister   . Seizures Sister   . Deep vein thrombosis Other   . Pulmonary embolism Other   . Cancer Paternal Grandmother   . Lung disease Neg Hx   . Colon cancer Neg Hx   . Esophageal cancer Neg Hx   . Rectal cancer Neg Hx   .  Stomach cancer Neg Hx    Social History   Socioeconomic History  . Marital status: Married  Social Needs  Occupational History  Tobacco Use  . Smoking status: Current Every Day Smoker    Packs/day: 1.00    Years: 48.00    Pack years: 48.00  Types: Cigarettes    Start date: 12/16/1965  . Smokeless tobacco: Never Used  Substance and Sexual Activity  . Alcohol use: No    Alcohol/week: 0.0 oz  . Drug use: No  Other Topics Concern  . Not on file  Social History Narrative   Originally from [place], [place]. Always lived in [place]. Travel to the [place] in March 2016. Has traveled to [place], [place], [place], & [place]. 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 No known asbestos exposure. Has 2 parakeets currently. Remotely raised parakeets, love birds, & cockatiels in a different house. No known mold or hot tub exposure.     Review of Systems: Pertinent items noted in HPI and remainder of comprehensive ROS otherwise negative.  Physical Exam: BP 127/78   Pulse (!) 57   Temp 98.1 F (36.7 C) (Oral)   Resp 16   Ht 5\' 9"  (1.753 m)   Wt 77.1 kg (170 lb)   SpO2 94%   BMI 25.10 kg/m   General Appearance:    Alert, cooperative, no distress, appears stated age  HEENT:    Raised 2-cm papule on scalp with no overlying skin changes, tenderness, or erythema. Corneas clear. Moist oral mucosa.   Lungs:     Clear to auscultation bilaterally, respirations unlabored  Chest wall:    Tender to palpation of lower left lateral chest wall  Heart:    Bradycardic, regular rhythm, S1 and S2 normal, no murmur, rub, or gallop  Abdomen:     Soft, nontender to palpation of LUQ, bowel sounds active, no masses, no organomegaly  Extremities:   Extremities normal, atraumatic, no cyanosis or edema  Pulses:   2+ and symmetric all extremities  Skin:   Skin color appropriate for ethnicity and consistent with chronic sun exposure. Numerous  freckles over body.   Lymph nodes:   Cervical, supraclavicular, and axillary nodes normal  Neurologic:  Alert and oriented x 4. Short-term and long-term memory intact.     Lab results: Hgb - 15.3, Hct - 44.5, Plts - 255, WBC - 11.1  Na - 139, K - 3.9, Cl - 105, CO2 - 24, Glucose - 115, Cr - 1.39 (H), BUN - 10  Troponin (14:00) - 0   Imaging results:  DX Chest 2 View Result Date: 02/03/2017  FINDINGS: Mild pectus excavatum deformity. Midline trachea. Normal heart size. Atherosclerosis in the transverse aorta. No pleural effusion or pneumothorax. Lower cervical spine fixation. Mild volume loss at the left lung base. Suspect mild interstitial lung disease in the subpleural lower lobes, similar.   IMPRESSION: No acute cardiopulmonary disease. Aortic Atherosclerosis. Suspect subtle interstitial lung disease at the bases. Consider nonemergent outpatient high-resolution chest CT, especially if the patient has chronic shortness of breath.    Other results: EKG (1/13) - Normal sinus rhythm. Rate 82 bpm. No ST elevations or depressions. U waves noted in the anterolateral precordial leads. No T wave inversions.    Assessment & Plan by Problem: Pt is a 67yo M with a hx of HTN, HLD, AAA s/p stent placement, hiatal hernia, COPD, and tobacco use who presented to the ED with 1 day of L-sided chest and arm pain that is sharp, intermittent, and accompanied by an episode of diaphoresis and nausea yesterday. No ischemic EKG changes were noted and initial troponins were negative, but due to his risk factors of HLD, HTN, and tobacco use, he will be admitted  to trend troponins and rule out ACS.   1. Chest Pain  Pt has intermittent sharp pain in his L lateral chest and arm that is atypical for ACS. Other conditions on the differential include pericarditis, GERD, esophageal dysmotility, hyperthyroidism, MSK strain, costochondritis, pulmonary embolism, and pneumonia. He does not have a fever or cough that would  suggest pneumonia. He is not hypoxic or tachycardic to suggest pulmonary embolism, and he has not had any recent surgeries. If he had pericarditis, I would expect his pain to be more positional or mediated by exertion. I did not hear a friction rub on my exam.He did report that he had some discomfort when palpating deep in the LUQ, but he says that that discomfort on palpation is more consistent with what is usually a hiatal hernia and GERD. I suspect that this is likely of MSK etiology, but would like to observe him overnight due to his increased CV risk.  - Continue to trend troponins q6 hrs  - Will obtain HbA1c, Lipid panel, TSH to assess current CV risk - May consider coronary CT if pt's chest pain recurs more severely  - Will likely recommend a stress test as an outpatient. Last stress test in October 2016 was negative.   2. GERD/Hiatal Hernia - Possibly contributing to his chest pain, as his pain improved with an OTC antacid - Will manage reflux with Famotidine  3. Tobacco Use Pt has a 64 pck yr history, and he reports that he was able to quit for 4 years recently. However, he has resumed smoking 1 ppd, and he is not ready to quit. Tobacco cessation options were discussed. - Will consider adding nicotine patch upon patient request  4. Chronic Kidney Disease Stage II - Cr stable at 1.39 compared over the last 2 yrs. No evidence of AKI  5. Hypertension - Normotensive to 110s-120s after taking his BP meds earlier this morning.  - Continue home Losartan  6. COPD - Continue steroid-LABA inhaler BID with Albuterol prn   Fluids: Not necessary as pt can PO Electrolytes: Wnl  Nutrition: Regular diet PPx: SQ Lovenox Dispo: Likely discharge tomorrow if symptoms have not worsened and workup is unremarkable.   This is a Careers information officer Note.  The care of the patient was discussed with Dr. Danford Bad & Dr. Rebeca Alert, and the assessment and plan were formulated with their assistance.  Please see their  note for official documentation of the patient encounter.   Signed: Annye Asa, Medical Student 02/03/2017, 4:21 PM

## 2017-02-04 ENCOUNTER — Ambulatory Visit (HOSPITAL_COMMUNITY)
Admission: EM | Disposition: A | Discharge status: Home / Self Care | Payer: Self-pay | Source: Home / Self Care | Attending: Emergency Medicine

## 2017-02-04 DIAGNOSIS — I2510 Atherosclerotic heart disease of native coronary artery without angina pectoris: Secondary | ICD-10-CM

## 2017-02-04 DIAGNOSIS — Z720 Tobacco use: Secondary | ICD-10-CM

## 2017-02-04 DIAGNOSIS — E785 Hyperlipidemia, unspecified: Secondary | ICD-10-CM

## 2017-02-04 DIAGNOSIS — N182 Chronic kidney disease, stage 2 (mild): Secondary | ICD-10-CM | POA: Diagnosis not present

## 2017-02-04 DIAGNOSIS — I10 Essential (primary) hypertension: Secondary | ICD-10-CM

## 2017-02-04 DIAGNOSIS — I739 Peripheral vascular disease, unspecified: Secondary | ICD-10-CM | POA: Diagnosis not present

## 2017-02-04 DIAGNOSIS — R0789 Other chest pain: Secondary | ICD-10-CM | POA: Diagnosis not present

## 2017-02-04 DIAGNOSIS — I129 Hypertensive chronic kidney disease with stage 1 through stage 4 chronic kidney disease, or unspecified chronic kidney disease: Secondary | ICD-10-CM | POA: Diagnosis not present

## 2017-02-04 DIAGNOSIS — F17210 Nicotine dependence, cigarettes, uncomplicated: Secondary | ICD-10-CM | POA: Diagnosis not present

## 2017-02-04 HISTORY — PX: LEFT HEART CATH AND CORONARY ANGIOGRAPHY: CATH118249

## 2017-02-04 LAB — PROTIME-INR
INR: 1.03
Prothrombin Time: 13.4 seconds (ref 11.4–15.2)

## 2017-02-04 LAB — COMPREHENSIVE METABOLIC PANEL
ALK PHOS: 44 U/L (ref 38–126)
ALT: 15 U/L — ABNORMAL LOW (ref 17–63)
ANION GAP: 11 (ref 5–15)
AST: 20 U/L (ref 15–41)
Albumin: 3.6 g/dL (ref 3.5–5.0)
BUN: 11 mg/dL (ref 6–20)
CALCIUM: 8.8 mg/dL — AB (ref 8.9–10.3)
CO2: 23 mmol/L (ref 22–32)
CREATININE: 1.19 mg/dL (ref 0.61–1.24)
Chloride: 103 mmol/L (ref 101–111)
Glucose, Bld: 81 mg/dL (ref 65–99)
Potassium: 4 mmol/L (ref 3.5–5.1)
SODIUM: 137 mmol/L (ref 135–145)
Total Bilirubin: 0.8 mg/dL (ref 0.3–1.2)
Total Protein: 6.8 g/dL (ref 6.5–8.1)

## 2017-02-04 LAB — LIPID PANEL
Cholesterol: 165 mg/dL (ref 0–200)
HDL: 29 mg/dL — ABNORMAL LOW (ref >40)
LDL Cholesterol: 115 mg/dL — ABNORMAL HIGH (ref 0–99)
Total CHOL/HDL Ratio: 5.7 RATIO
Triglycerides: 106 mg/dL (ref <150)
VLDL: 21 mg/dL (ref 0–40)

## 2017-02-04 LAB — TROPONIN I

## 2017-02-04 LAB — CBC
HCT: 41.4 % (ref 39.0–52.0)
HEMOGLOBIN: 14.7 g/dL (ref 13.0–17.0)
MCH: 31.6 pg (ref 26.0–34.0)
MCHC: 35.5 g/dL (ref 30.0–36.0)
MCV: 89 fL (ref 78.0–100.0)
PLATELETS: 230 10*3/uL (ref 150–400)
RBC: 4.65 MIL/uL (ref 4.22–5.81)
RDW: 13.6 % (ref 11.5–15.5)
WBC: 9.7 10*3/uL (ref 4.0–10.5)

## 2017-02-04 LAB — HIV ANTIBODY (ROUTINE TESTING W REFLEX): HIV SCREEN 4TH GENERATION: NONREACTIVE

## 2017-02-04 SURGERY — LEFT HEART CATH AND CORONARY ANGIOGRAPHY
Anesthesia: LOCAL

## 2017-02-04 MED ORDER — IOPAMIDOL (ISOVUE-370) INJECTION 76%
INTRAVENOUS | Status: DC | PRN
  Administered 2017-02-04: 55 mL

## 2017-02-04 MED ORDER — HEPARIN (PORCINE) IN NACL 2-0.9 UNIT/ML-% IJ SOLN
INTRAMUSCULAR | Status: AC
  Filled 2017-02-04: qty 1000

## 2017-02-04 MED ORDER — HEPARIN (PORCINE) IN NACL 2-0.9 UNIT/ML-% IJ SOLN
INTRAMUSCULAR | Status: DC | PRN
  Administered 2017-02-04: 14:00:00

## 2017-02-04 MED ORDER — SODIUM CHLORIDE 0.9% FLUSH: 3.0000 mL | Freq: Two times a day (BID) | INTRAVENOUS | Status: DC

## 2017-02-04 MED ORDER — SODIUM CHLORIDE 0.9 % IV SOLN: 250.0000 mL | INTRAVENOUS | Status: DC | PRN

## 2017-02-04 MED ORDER — MIDAZOLAM HCL 2 MG/2ML IJ SOLN
INTRAMUSCULAR | Status: DC | PRN
  Administered 2017-02-04: 1 mg via INTRAVENOUS

## 2017-02-04 MED ORDER — FENTANYL CITRATE (PF) 100 MCG/2ML IJ SOLN
INTRAMUSCULAR | Status: DC | PRN
  Administered 2017-02-04: 25 ug via INTRAVENOUS

## 2017-02-04 MED ORDER — NITROGLYCERIN 1 MG/10 ML FOR IR/CATH LAB
INTRA_ARTERIAL | Status: AC
  Filled 2017-02-04: qty 10

## 2017-02-04 MED ORDER — SODIUM CHLORIDE 0.9 % WEIGHT BASED INFUSION
3.0000 mL/kg/h | INTRAVENOUS | Status: DC
  Administered 2017-02-04: 3 mL/kg/h via INTRAVENOUS

## 2017-02-04 MED ORDER — LIDOCAINE HCL (PF) 1 % IJ SOLN
INTRAMUSCULAR | Status: DC | PRN
  Administered 2017-02-04: 2 mL

## 2017-02-04 MED ORDER — SODIUM CHLORIDE 0.9 % WEIGHT BASED INFUSION: 1.0000 mL/kg/h | INTRAVENOUS | Status: DC

## 2017-02-04 MED ORDER — IOPAMIDOL (ISOVUE-370) INJECTION 76%
INTRAVENOUS | Status: AC
  Filled 2017-02-04: qty 100

## 2017-02-04 MED ORDER — ASPIRIN 81 MG PO CHEW: 81.0000 mg | CHEWABLE_TABLET | ORAL | Status: DC

## 2017-02-04 MED ORDER — ACETAMINOPHEN 325 MG PO TABS: 650.0000 mg | ORAL_TABLET | ORAL | Status: DC | PRN

## 2017-02-04 MED ORDER — ASPIRIN 81 MG PO CHEW: 81.0000 mg | CHEWABLE_TABLET | Freq: Every day | ORAL | Status: DC

## 2017-02-04 MED ORDER — NICOTINE 14 MG/24HR TD PT24: 14.0000 mg | MEDICATED_PATCH | Freq: Every day | TRANSDERMAL | 0 refills | Status: DC | PRN

## 2017-02-04 MED ORDER — HEPARIN SODIUM (PORCINE) 1000 UNIT/ML IJ SOLN
INTRAMUSCULAR | Status: DC | PRN
  Administered 2017-02-04: 3500 [IU] via INTRAVENOUS

## 2017-02-04 MED ORDER — ATORVASTATIN CALCIUM 80 MG PO TABS
80.0000 mg | ORAL_TABLET | Freq: Every day | ORAL | Status: DC
  Administered 2017-02-04: 80 mg via ORAL
  Filled 2017-02-04: qty 1

## 2017-02-04 MED ORDER — ONDANSETRON HCL 4 MG/2ML IJ SOLN: 4.0000 mg | Freq: Four times a day (QID) | INTRAMUSCULAR | Status: DC | PRN

## 2017-02-04 MED ORDER — MORPHINE SULFATE (PF) 2 MG/ML IV SOLN: 2.0000 mg | INTRAVENOUS | Status: DC | PRN

## 2017-02-04 MED ORDER — FENTANYL CITRATE (PF) 100 MCG/2ML IJ SOLN
INTRAMUSCULAR | Status: AC
Start: 2017-02-04 — End: ?
  Filled 2017-02-04: qty 2

## 2017-02-04 MED ORDER — SODIUM CHLORIDE 0.9% FLUSH: 3.0000 mL | INTRAVENOUS | Status: DC | PRN

## 2017-02-04 MED ORDER — VERAPAMIL HCL 2.5 MG/ML IV SOLN
INTRAVENOUS | Status: DC | PRN
  Administered 2017-02-04: 5 mL via INTRA_ARTERIAL

## 2017-02-04 MED ORDER — ATORVASTATIN CALCIUM 80 MG PO TABS: 80.0000 mg | ORAL_TABLET | Freq: Every day | ORAL | 0 refills | Status: DC

## 2017-02-04 MED ORDER — SODIUM CHLORIDE 0.9 % IV SOLN: INTRAVENOUS | Status: AC

## 2017-02-04 MED ORDER — LIDOCAINE HCL (PF) 1 % IJ SOLN
INTRAMUSCULAR | Status: AC
  Filled 2017-02-04: qty 30

## 2017-02-04 MED ORDER — HEPARIN SODIUM (PORCINE) 1000 UNIT/ML IJ SOLN
INTRAMUSCULAR | Status: AC
  Filled 2017-02-04: qty 1

## 2017-02-04 MED ORDER — OXYCODONE HCL 5 MG PO TABS
20.0000 mg | ORAL_TABLET | ORAL | Status: DC | PRN
  Administered 2017-02-04: 20 mg via ORAL
  Filled 2017-02-04: qty 4

## 2017-02-04 MED ORDER — MIDAZOLAM HCL 2 MG/2ML IJ SOLN
INTRAMUSCULAR | Status: AC
  Filled 2017-02-04: qty 2

## 2017-02-04 MED ORDER — ASPIRIN EC 81 MG PO TBEC: 81.0000 mg | DELAYED_RELEASE_TABLET | Freq: Every day | ORAL | Status: DC

## 2017-02-04 MED ORDER — VERAPAMIL HCL 2.5 MG/ML IV SOLN
INTRAVENOUS | Status: AC
  Filled 2017-02-04: qty 2

## 2017-02-04 SURGICAL SUPPLY — 12 items
CATH INFINITI 5FR ANG PIGTAIL (CATHETERS) ×1 IMPLANT
CATH OPTITORQUE TIG 4.0 5F (CATHETERS) ×1 IMPLANT
DEVICE RAD COMP TR BAND LRG (VASCULAR PRODUCTS) ×1 IMPLANT
GLIDESHEATH SLEND A-KIT 6F 22G (SHEATH) ×1 IMPLANT
GUIDEWIRE INQWIRE 1.5J.035X260 (WIRE) IMPLANT
INQWIRE 1.5J .035X260CM (WIRE) ×2
KIT HEART LEFT (KITS) ×2 IMPLANT
PACK CARDIAC CATHETERIZATION (CUSTOM PROCEDURE TRAY) ×2 IMPLANT
SYR MEDRAD MARK V 150ML (SYRINGE) ×2 IMPLANT
TRANSDUCER W/STOPCOCK (MISCELLANEOUS) ×2 IMPLANT
TUBING CIL FLEX 10 FLL-RA (TUBING) ×2 IMPLANT
WIRE HI TORQ VERSACORE-J 145CM (WIRE) ×1 IMPLANT

## 2017-02-04 NOTE — Interval H&P Note (Signed)
Cath Lab Visit (complete for each Cath Lab visit)  Clinical Evaluation Leading to the Procedure:   ACS: No.  Non-ACS:    Anginal Classification: CCS II  Anti-ischemic medical therapy: Minimal Therapy (1 class of medications)  Non-Invasive Test Results: No non-invasive testing performed  Prior CABG: No previous CABG      History and Physical Interval Note:  02/04/2017 2:14 PM  Tia Masker  has presented today for surgery, with the diagnosis of cp  The various methods of treatment have been discussed with the patient and family. After consideration of risks, benefits and other options for treatment, the patient has consented to  Procedure(s): LEFT HEART CATH AND CORONARY ANGIOGRAPHY (N/A) as a surgical intervention .  The patient's history has been reviewed, patient examined, no change in status, stable for surgery.  I have reviewed the patient's chart and labs.  Questions were answered to the patient's satisfaction.     Quay Burow

## 2017-02-04 NOTE — Consult Note (Signed)
Cardiology Consultation:   Patient ID: [name]; [id number]; 08/21/1950   Admit date: 02/03/2017 Date of Consult: 02/04/2017  Primary Care Provider: Cory Munch, PA-C Primary Cardiologist: Sherren Mocha, MD  Primary Electrophysiologist:  N/A   Patient Profile:   [name] is a 67 y.o. male with a hx of PVD, AAA s/p EVAR, evidence of coronary artery calcifications chest CT scans, HTN, HLD and tobacco abuse, who is being seen today for the evaluation of chest pain at the request of Dr. Rebeca Alert, Internal Medicine.  History of Present Illness:   [name] is a 67 y.o. male with a hx of AAA s/p EVAR 5/12, HTN, HL, tobacco abuse, emphysema, GERD.  He was evaluated by Dr. Sherren Mocha and Melina Copa, PA-C, in 9/16 for dyspnea.  Nuclear stress test was low risk.  He has been followed by pulmonology for emphysema and recent finding of pulmonary nodules.   He presented to the [facility] ED overnight with complaints of chest pain. Atypical symptoms. Left sided chest pain, sharp, shooting pains, radiating down the left arm. Also experienced "hot flashes" and felt "clamy" all over. He was nauseated but no vomiting.   In ED, EKG was nonischemic and initial troponin was negative. Given his cardiac risk factors, he was admitted for overnight observation for rule-out.   Vital signs have remained stable overnight. He is currently CP free. Troponins are negative x 3. EKG shows sinus bradycardia, 46 bpm. Pt's chart reviewed. Previous chest CT from 2017 showed heavily calcified coronaries.    Past Medical History:  Diagnosis Date  . AAA (abdominal aortic aneurysm) ([place])    a. s/p EVAR 05/2010.  . Back pain   . CKD (chronic kidney disease), stage II   . Degenerative disk disease    a. Chronic back pain with ridiculopathy.  . Diverticulosis   . Emphysema of lung ([place])   . Essential hypertension   . GERD (gastroesophageal reflux disease)   . Hiatal hernia   . Hyperlipidemia   . Neck  rigidity    from cervical fusion- patient cannot turn head  . Pulmonary nodule    a. 53mm nodule seen on CT 09/2014 - f/u recommended 1 yr.  . Skin cancer   . Tobacco abuse     Past Surgical History:  Procedure Laterality Date  . ABDOMINAL AORTIC ENDOVASCULAR STENT GRAFT  2014  . APPENDECTOMY    . BACK SURGERY    . BIOPSY N/A 06/10/2013   Procedure: BIOPSY;  Surgeon: Rogene Houston, MD;  Location: [facility];  Service: Endoscopy;  Laterality: N/A;  . BIOPSY  12/03/2014   Procedure: BIOPSY (Gastroesophageal Junction);  Surgeon: Rogene Houston, MD;  Location: [facility];  Service: Endoscopy;;  . ESOPHAGEAL DILATION N/A 12/03/2014   Procedure: ESOPHAGEAL DILATION WITH 54FR AND 56FR MALONEY;  Surgeon: Rogene Houston, MD;  Location: [facility];  Service: Endoscopy;  Laterality: N/A;  . ESOPHAGOGASTRODUODENOSCOPY (EGD) WITH PROPOFOL N/A 06/10/2013   Procedure: ESOPHAGOGASTRODUODENOSCOPY (EGD) WITH PROPOFOL;  Surgeon: Rogene Houston, MD;  Location: [facility];  Service: Endoscopy;  Laterality: N/A;  . ESOPHAGOGASTRODUODENOSCOPY (EGD) WITH PROPOFOL N/A 12/03/2014   Procedure: ESOPHAGOGASTRODUODENOSCOPY (EGD) WITH PROPOFOL;  Surgeon: Rogene Houston, MD;  Location: [facility];  Service: Endoscopy;  Laterality: N/A;  . EVAR  06/20/10   for AAA repair   . MALONEY DILATION N/A 06/10/2013   Procedure: MALONEY DILATION 52 fr, 28 fr;  Surgeon: Rogene Houston, MD;  Location: [facility];  Service: Endoscopy;  Laterality: N/A;  . SPINAL FUSION     of neck and lower back     Home Medications:  Prior to Admission medications   Medication Sig Start Date End Date Taking? Authorizing Provider  acetaminophen (TYLENOL) 500 MG tablet Take 1,000 mg by mouth every 6 (six) hours as needed for headache (pain).   Yes [provider]  albuterol (PROAIR HFA) 108 (90 Base) MCG/ACT inhaler Inhale 2 puffs every 6 (six) hours as needed into the lungs. Patient taking differently: Inhale 2 puffs into the lungs every 6 (six) hours as  needed for wheezing or shortness of breath.  12/05/16  Yes Javier Glazier, MD  aspirin EC 81 MG tablet Take 81 mg by mouth daily.   Yes [provider]  budesonide-formoterol (SYMBICORT) 160-4.5 MCG/ACT inhaler Inhale 2 puffs into the lungs 2 (two) times daily. 06/04/16  Yes Javier Glazier, MD  diazepam (VALIUM) 5 MG tablet Take 5 mg by mouth daily as needed for anxiety.   Yes [provider]  esomeprazole (NEXIUM) 40 MG capsule TAKE 1 CAPSULE (40 MG TOTAL) BY MOUTH 2 (TWO) TIMES DAILY BEFORE A MEAL. Patient taking differently: Take 40 mg by mouth 2 (two) times daily before a meal.  01/18/17  Yes Rehman, Mechele Dawley, MD  losartan-hydrochlorothiazide (HYZAAR) 100-12.5 MG tablet Take one-half tablet daily by mouth Patient taking differently: Take 0.25 tablets by mouth daily as needed (SBP >130 or headache from high blood pressure).  12/30/14  Yes Sherren Mocha, MD  Oxycodone HCl 20 MG TABS Take 20 mg by mouth every 4 (four) hours. scheduled   Yes [provider]  sodium chloride (OCEAN) 0.65 % SOLN nasal spray Place 1 spray into both nostrils daily as needed for congestion.   Yes [provider]  Spacer/Aero-Holding Chambers (AEROCHAMBER MV) inhaler Use as instructed Patient taking differently: See admin instructions. Use as instructed with Symbicort 11/09/15  Yes Javier Glazier, MD    Inpatient Medications: Scheduled Meds: . aspirin EC  81 mg Oral Daily  . enoxaparin (LOVENOX) injection  40 mg Subcutaneous Q24H  . famotidine  40 mg Oral BID  . fluticasone furoate-vilanterol  1 puff Inhalation Daily  . losartan  50 mg Oral Daily  . sodium chloride flush  3 mL Intravenous Q12H   Continuous Infusions: . sodium chloride     PRN Meds: aluminum hydroxide-magnesium carbonate, nicotine, nicotine polacrilex, oxyCODONE  Allergies:    Allergies  Allergen Reactions  . Benazepril Hcl Other (See Comments)    Can't breathe  . Pantoprazole Sodium Other  (See Comments)    Can't breathe  . Amlodipine Swelling    Social History:   Social History   Socioeconomic History  . Marital status: Married    Spouse name: Not on file  . Number of children: Not on file  . Years of education: Not on file  . Highest education level: Not on file  Social Needs  . Financial resource strain: Not on file  . Food insecurity - worry: Not on file  . Food insecurity - inability: Not on file  . Transportation needs - medical: Not on file  . Transportation needs - non-medical: Not on file  Occupational History  . Not on file  Tobacco Use  . Smoking status: Current Every Day Smoker    Packs/day: 1.00    Years: 48.00    Pack years: 48.00    Types: Cigarettes    Start date: 12/16/1965  .  Smokeless tobacco: Never Used  Substance and Sexual Activity  . Alcohol use: No    Alcohol/week: 0.0 oz  . Drug use: No  . Sexual activity: Not on file  Other Topics Concern  . Not on file  Social History Narrative   Originally from [place], [place]. Always lived in [place]. Travel to the [place] in March 2016. Has traveled to [place], [place], [place], & [place]. 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 No known asbestos exposure. Has 2 parakeets currently. Remotely raised parakeets, love birds, & cockatiels in a different house. No known mold or hot tub exposure.     Family History:    Family History  Problem Relation Age of Onset  . Atrial fibrillation Mother   . Heart disease Mother        Patient unclear of what kind  . Deep vein thrombosis Mother   . Hyperlipidemia Mother   . Hypertension Mother   . Stroke Father   . Diabetes Father   . Heart disease Father        Patient unclear of what kind  . Kidney disease Father   . Hyperlipidemia Father   . Hypertension Father   . Diabetes Sister   . Heart disease Sister        Patient unclear of what kind  . Brain cancer Sister   . Seizures  Sister   . Deep vein thrombosis Other   . Pulmonary embolism Other   . Cancer Paternal Grandmother   . Lung disease Neg Hx   . Colon cancer Neg Hx   . Esophageal cancer Neg Hx   . Rectal cancer Neg Hx   . Stomach cancer Neg Hx      ROS:  Please see the history of present illness.  ROS  All other ROS reviewed and negative.     Physical Exam/Data:   Vitals:   02/03/17 2115 02/03/17 2311 02/04/17 0448 02/04/17 0743  BP: 121/90 118/79 114/73 110/84  Pulse: (!) 48 (!) 58 (!) 50 (!) 56  Resp: 14 14 15 15   Temp: 98.1 F (36.7 C) 97.9 F (36.6 C) 97.8 F (36.6 C) 98.3 F (36.8 C)  TempSrc: Oral Oral Oral Oral  SpO2: 98% 96% 96% 97%  Weight:   164 lb 14.4 oz (74.8 kg)   Height:        Intake/Output Summary (Last 24 hours) at 02/04/2017 0839 Last data filed at 02/03/2017 2202 Gross per 24 hour  Intake 3 ml  Output -  Net 3 ml   Filed Weights   02/03/17 1359 02/03/17 1800 02/04/17 0448  Weight: 170 lb (77.1 kg) 166 lb 1.6 oz (75.3 kg) 164 lb 14.4 oz (74.8 kg)   Body mass index is 24.35 kg/m.  General:  Well nourished, well developed, in no acute distress HEENT: rigid neck Lymph: no adenopathy Neck: no JVD Endocrine:  No thryomegaly Vascular: No carotid bruits; FA pulses 2+ bilaterally without bruits  Cardiac:  normal S1, S2; RRR; no murmur  Lungs:  clear to auscultation bilaterally, no wheezing, rhonchi or rales  Abd: soft, nontender, no hepatomegaly  Ext: no edema Musculoskeletal:  No deformities, BUE and BLE strength normal and equal Skin: warm and dry  Neuro:  CNs 2-12 intact, no focal abnormalities noted Psych:  Normal affect   EKG:  The EKG was personally reviewed and demonstrates: sinus bradycardia 46 bpm  Telemetry:  Telemetry was  personally reviewed and demonstrates:  NSR in the mid 60s currently  Relevant CV Studies: LHC-  pending   Laboratory Data:  Chemistry Recent Labs  Lab 02/03/17 1355 02/04/17 0525  NA 139 137  K 3.9 4.0  CL 105 103    CO2 24 23  GLUCOSE 115* 81  BUN 10 11  CREATININE 1.39* 1.19  CALCIUM 9.1 8.8*  GFRNONAA 52* >60  GFRAA >60 >60  ANIONGAP 10 11    Recent Labs  Lab 02/04/17 0525  PROT 6.8  ALBUMIN 3.6  AST 20  ALT 15*  ALKPHOS 44  BILITOT 0.8   Hematology Recent Labs  Lab 02/03/17 1355 02/04/17 0525  WBC 11.1* 9.7  RBC 5.03 4.65  HGB 15.3 14.7  HCT 44.5 41.4  MCV 88.5 89.0  MCH 30.4 31.6  MCHC 34.4 35.5  RDW 13.2 13.6  PLT 255 230   Cardiac Enzymes Recent Labs  Lab 02/03/17 1659 02/03/17 2224 02/04/17 0525  TROPONINI <0.03 <0.03 <0.03    Recent Labs  Lab 02/03/17 1416  TROPIPOC 0.00    BNPNo results for input(s): BNP, PROBNP in the last 168 hours.  DDimer No results for input(s): DDIMER in the last 168 hours.  Radiology/Studies:  Dg Chest 2 View  Result Date: 02/03/2017 CLINICAL DATA:  Left-sided chest pain since yesterday. Shortness of breath. Nausea and dizziness. EXAM: CHEST  2 VIEW COMPARISON:  02/27/2016 FINDINGS: Mild pectus excavatum deformity. Midline trachea. Normal heart size. Atherosclerosis in the transverse aorta. No pleural effusion or pneumothorax. Lower cervical spine fixation. Mild volume loss at the left lung base. Suspect mild interstitial lung disease in the subpleural lower lobes, similar. IMPRESSION: No acute cardiopulmonary disease. Aortic Atherosclerosis (ICD10-I70.0). Suspect subtle interstitial lung disease at the bases. Consider nonemergent outpatient high-resolution chest CT, especially if the patient has chronic shortness of breath. Electronically Signed   By: Abigail Miyamoto M.D.   On: 02/03/2017 14:21    Assessment and Plan:   1. Chest Pain: although CP is somewhat atypical and cardiac enzymes are negative x 3, he had a chest CT in 2017 that showed heavily calcified coronaries. In addition, he has several other cardiac risk factors, including known PVD, s/p EVAR for AAA, tobacco abuse, HTN and HLD. Given his risk factors and CP, we have  recommended definitive LHC. We have reviewed the risks, indications, and alternatives to cardiac catheterization and possible angioplasty/stenting with the patient. Risks include but are not limited to bleeding, infection, vascular injury, stroke, myocardial infection, arrhythmia, kidney injury, radiation-related injury in the case of prolonged fluoroscopy use, emergency cardiac surgery, and death. The patient understands the risks of serious complication is low (<7%). We will add on case for today.     For questions or updates, please contact [place] Please consult [email or web address] for contact info under Cardiology/STEMI.   Signed, Lyda Jester, PA-C  02/04/2017 8:39 AM   Personally seen and examined. Agree with above.  67 year old with known severe peripheral vascular disease status post EVAR AAA, ongoing tobacco use, hypertension, hyperlipidemia with chest pain with fairly atypical features but some concerning as well, negative troponin, EKG with bradycardia otherwise no ischemic changes.  Personally reviewed his last CT scan from 2017 and he has severe coronary artery calcification including the left main, LAD from proximal to mid and circumflex in the proximal region.  Did not see any RCA calcification.  Prior nuclear stress test approximately 2 years ago did not show any ischemia.  He stated that he felt a  prickly-like sensation under his left breast, left axillary region yesterday but also had symptoms of left arm pressure a few days ago that was concerning to him.  He also had the sensation of heat, hot flash, felt sweaty, went outside to try to cool off.  Exam reveals normal radial pulse, overall no wheezes he is alert heart without any rubs, no murmurs regular rate and rhythm.  -With his multiple cardiac risk factors, severe coronary artery calcification noted on CT scan, symptoms with both typical and atypical features, peripheral vascular disease, I think it makes sense for [place]  to proceed with cardiac catheterization for definitive diagnosis.  Given his risk factors, he could have severe CAD, perhaps even left main disease.  He is amenable to catheterization.  Continue with aggressive secondary prevention.  Candee Furbish, MD

## 2017-02-04 NOTE — Discharge Instructions (Signed)
Mr. Kaiyon, [place] were admitted for evaluation of your chest pain. You underwent a cardiac catheterization to look at the blood flow to your heart which was NORMAL. Your chest pain is very unlikely to be related to your heart.  We do want to reduce your risk of having a heart attack in the future. Your cholesterol was higher than desired, and so the cardiologist has restarted your Lipitor. Please take one of these pills every evening.  Please continue taking your blood pressure medicine.  Please try to quit smoking as soon as you can.   Make a follow up appointment to see your primary care doctor by next week.

## 2017-02-04 NOTE — Progress Notes (Signed)
Patient received discharge information and acknowledged understanding of it. Patient received radial site care education. Patient IVs were removed.

## 2017-02-04 NOTE — Discharge Summary (Signed)
Name: [name] MRN: [id number] DOB: 1950-07-07 67 y.o. PCP: Ginger Organ  Date of Admission: 02/03/2017  2:03 PM Date of Discharge: 02/04/2017 Attending Physician: Oda Kilts, MD  Discharge Diagnosis: 1. Atypical chest pain   Discharge Medications:  Medication List    STOP taking these medications   diazepam 5 MG tablet Commonly known as:  VALIUM     TAKE these medications   acetaminophen 500 MG tablet Commonly known as:  TYLENOL Take 1,000 mg by mouth every 6 (six) hours as needed for headache (pain).   AEROCHAMBER MV inhaler Use as instructed What changed:    when to take this  additional instructions   albuterol 108 (90 Base) MCG/ACT inhaler Commonly known as:  PROAIR HFA Inhale 2 puffs every 6 (six) hours as needed into the lungs. What changed:  reasons to take this   aspirin EC 81 MG tablet Take 81 mg by mouth daily.   atorvastatin 80 MG tablet Commonly known as:  LIPITOR Take 1 tablet (80 mg total) by mouth daily at 6 PM.   budesonide-formoterol 160-4.5 MCG/ACT inhaler Commonly known as:  SYMBICORT Inhale 2 puffs into the lungs 2 (two) times daily.   esomeprazole 40 MG capsule Commonly known as:  NEXIUM TAKE 1 CAPSULE (40 MG TOTAL) BY MOUTH 2 (TWO) TIMES DAILY BEFORE A MEAL. What changed:    how much to take  how to take this  when to take this  additional instructions   losartan-hydrochlorothiazide 100-12.5 MG tablet Commonly known as:  HYZAAR Take one-half tablet daily by mouth What changed:    how much to take  how to take this  when to take this  reasons to take this  additional instructions   nicotine 14 mg/24hr patch Commonly known as:  NICODERM CQ - dosed in mg/24 hours Place 1 patch (14 mg total) onto the skin daily as needed (tobacco craving).   Oxycodone HCl 20 MG Tabs Take 20 mg by mouth every 4 (four) hours. scheduled   sodium chloride 0.65 % Soln nasal spray Commonly known as:   OCEAN Place 1 spray into both nostrils daily as needed for congestion.       Disposition and follow-up:   Mr.Guillaume COREE BRAME was discharged from [facility] in good condition.  At the hospital follow up visit, please address:  1.  His new prescription for Lipitor.   2.  If there has been resolution of his L-sided chest pain   Follow-up Appointments: Pt was instructed to see his primary care doctor by the end of nex tweek.    Hospital Course by problem list: Atypical chest pain - Pt was admitted on 1/13 after having 1 day of atypical chest pain that was sharp, shooting, intermittent, nonexertional, and nonpositional. He was admitted to rule out ACS due to his history of HTN, HLD, tobacco use, and calcified coronary arteries on a CT in 2017. His admission EKG did not show any ischemic changes, and his troponins were negative x 3. Cardiology performed a left heart cath on 1/14 that was negative for any abnormalities in his coronary arteries, systolic, or diastolic function. Pt's chest pain was occurring less frequently at the time of discharge.    Discharge Physical Exam:   BP 131/75 (BP Location: Right Arm)   Pulse 72   Temp 98.5 F (36.9 C) (Oral)   Resp 14   Ht 5\' 9"  (1.753 m)   Wt 74.8 kg (164 lb  14.4 oz)   SpO2 96%   BMI 24.35 kg/m    General Appearance:    Alert, cooperative, no distress, appears stated age, resting comfortably in bed  Head:    Moist oral mucosa.  Lungs:     Faint crackles bilaterally, respirations unlabored  Chest wall:    No tenderness to palpation  Heart:    Bradycardic, regular rhythm, S1 and S2 normal, no murmur, rub, or gallop  Abdomen:     Soft, non-tender, bowel sounds active all four quadrants,    no masses, no organomegaly  Extremities:   Extremities normal, atraumatic, no cyanosis or edema  Pulses:   2+ and symmetric all extremities  Skin:   Skin color appropriate for ethnicity  Neurologic:   Alert and oriented.     Pertinent Labs, Studies, and Procedures:  EKG (1/13) - No ST changes or T-wave inversions compared to prior  Troponins x 3 - Negative  Catheterization (1/14)  Discharge Instructions: Mr. [place], [place] were admitted for evaluation of your chest pain. You underwent a cardiac catheterization to look at the blood flow to your heart which was NORMAL. Your chest pain is very unlikely to be related to your heart.  We do want to reduce your risk of having a heart attack in the future. Your cholesterol was higher than desired, and so the cardiologist has restarted your Lipitor. Please take one of these pills every evening.  Please continue taking your blood pressure medicine.  Please try to quit smoking as soon as you can.  Make a follow up appointment to see your primary care doctor by next week.      Signed: Annye Asa, Medical Student 02/04/2017, 12:55 PM   Pager: [phone]

## 2017-02-04 NOTE — Progress Notes (Signed)
Subjective: Pt reports that he is still having those L lateral chest pains, but they are milder in severity and less frequent. He also endorses having increased SOB over the last year. At this point in time, he says that he cannot walk and talk, and he even becomes dyspenic when talking for an extended period of time.   Objective: Vital signs in last 24 hours: Vitals:   02/03/17 2311 02/04/17 0448 02/04/17 0743 02/04/17 1209  BP: 118/79 114/73 110/84 131/75  Pulse: (!) 58 (!) 50 (!) 56 72  Resp: 14 15 15 14   Temp: 97.9 F (36.6 C) 97.8 F (36.6 C) 98.3 F (36.8 C) 98.5 F (36.9 C)  TempSrc: Oral Oral Oral Oral  SpO2: 96% 96% 97% 96%  Weight:  74.8 kg (164 lb 14.4 oz)      BP 131/75 (BP Location: Right Arm)   Pulse 72   Temp 98.5 F (36.9 C) (Oral)   Resp 14   Ht 5\' 9"  (1.753 m)   Wt 74.8 kg (164 lb 14.4 oz)   SpO2 96%   BMI 24.35 kg/m   General Appearance:    Alert, cooperative, no distress, appears stated age, resting comfortably in bed  Head:    Moist oral mucosa.  Lungs:     Faint crackles bilaterally, respirations unlabored  Chest wall:    No tenderness to palpation  Heart:    Bradycardic, regular rhythm, S1 and S2 normal, no murmur, rub, or gallop  Abdomen:     Soft, non-tender, bowel sounds active all four quadrants,    no masses, no organomegaly  Extremities:   Extremities normal, atraumatic, no cyanosis or edema  Pulses:   2+ and symmetric all extremities  Skin:   Skin color appropriate for ethnicity  Neurologic:   Alert and oriented.   Lab Results: Troponins x 3 - Negative  Na - 137, K - 4.0, Cl - 103, CO2 - 23, Glucose - 81, BUN - 11, Cr - 1.19, Ca - 8.8 (L)  AST - 20, ALT - 15, AP - 44, T. Bili - 0.8  WBC - 9.7, Hgb - 14.7, Hct - 41.4, Plts - 230   INR - 1.03  TSH - 0.756  HgbA1c - 5.5   Micro Results: No microbiology results for this hospitalization  Studies/Results: No diagnostics since admission.  Medications: I have reviewed the  patient's current medications. Scheduled Meds: . aspirin EC  81 mg Oral Daily  . enoxaparin (LOVENOX) injection  40 mg Subcutaneous Q24H  . famotidine  40 mg Oral BID  . fluticasone furoate-vilanterol  1 puff Inhalation Daily  . losartan  50 mg Oral Daily  . sodium chloride flush  3 mL Intravenous Q12H  . sodium chloride flush  3 mL Intravenous Q12H   PRN Meds:.sodium chloride, aluminum hydroxide-magnesium carbonate, nicotine, nicotine polacrilex, oxyCODONE, sodium chloride flush   Assessment/Plan: Pt is a 67yo M with a hx of HTN, HLD, AAA s/p stent placement, hiatal hernia, COPD, and tobacco use who presented to the ED on 1/13 with 1 day of L-sided chest and arm pain that is sharp, intermittent, and accompanied by an episode of diaphoresis and nausea yesterday. No ischemic EKG changes were noted and initial troponins were negative, but due to his risk factors of HLD, HTN, and tobacco use, his hx of progressive SOB, and the calcification in the coronary arteries noted on CT in 2017, he will have a catheterization today per cardiology.   1. Chest Pain  Pt has intermittent sharp pain in his L lateral chest and arm that is atypical for ACS.  - Troponins x 3 - Negative - HbA1c of 5.5 (negative for DM) - TSH within normal limits, making it less likely that this is hyperthyroidism - Lipid panel pending - Catheterization tentatively scheduled for today  2. GERD/Hiatal Hernia - Possibly contributing to his chest pain, as his pain improved with an OTC antacid at home - Will manage reflux with Famotidine - Gaviscon PRN  3. Tobacco Use Pt has a 17 pck yr history, and he reports that he was able to quit for 4 years recently. However, he has resumed smoking 1 ppd, and he is not ready to quit. Tobacco cessation options were discussed. - Nicotine patch and gum prn  4. Chronic Kidney Disease Stage II - Cr stable at 1.19 this morning. No evidence of AKI  5. Hypertension - Normotensive to  110s-120s - Continue home Losartan  6. COPD - Continue steroid-LABA inhaler BID with Albuterol prn   Fluids: Not necessary as pt can PO Electrolytes: Wnl  Nutrition: Regular diet PPx: SQ Lovenox Dispo: Discharge pending catheterization     This is a Medical Student Note.  The care of the patient was discussed with Dr. Danford Bad and Dr. Rebeca Alert, and the assessment and plan were formulated with their assistance.  Please see their attached note for official documentation of the daily encounter.   LOS: 0 days   Annye Asa, Medical Student 02/04/2017, 12:24 PM

## 2017-02-04 NOTE — H&P (View-Only) (Signed)
Cardiology Consultation:   Patient ID: [name]; [id number]; [birth year]   Admit date: 02/03/2017 Date of Consult: 02/04/2017  Primary Care Provider: Cory Munch, PA-C Primary Cardiologist: Sherren Mocha, MD  Primary Electrophysiologist:  N/A   Patient Profile:   [name] is a 67 y.o. male with a hx of PVD, AAA s/p EVAR, evidence of coronary artery calcifications chest CT scans, HTN, HLD and tobacco abuse, who is being seen today for the evaluation of chest pain at the request of Dr. Rebeca Alert, Internal Medicine.  History of Present Illness:   [name] is a 67 y.o. male with a hx of AAA s/p EVAR 5/12, HTN, HL, tobacco abuse, emphysema, GERD.  He was evaluated by Dr. Sherren Mocha and Melina Copa, PA-C, in 9/16 for dyspnea.  Nuclear stress test was low risk.  He has been followed by pulmonology for emphysema and recent finding of pulmonary nodules.   He presented to the [facility] ED overnight with complaints of chest pain. Atypical symptoms. Left sided chest pain, sharp, shooting pains, radiating down the left arm. Also experienced "hot flashes" and felt "clamy" all over. He was nauseated but no vomiting.   In ED, EKG was nonischemic and initial troponin was negative. Given his cardiac risk factors, he was admitted for overnight observation for rule-out.   Vital signs have remained stable overnight. He is currently CP free. Troponins are negative x 3. EKG shows sinus bradycardia, 46 bpm. Pt's chart reviewed. Previous chest CT from 2017 showed heavily calcified coronaries.    Past Medical History:  Diagnosis Date  . AAA (abdominal aortic aneurysm) ([place])    a. s/p EVAR 05/2010.  . Back pain   . CKD (chronic kidney disease), stage II   . Degenerative disk disease    a. Chronic back pain with ridiculopathy.  . Diverticulosis   . Emphysema of lung ([place])   . Essential hypertension   . GERD (gastroesophageal reflux disease)   . Hiatal hernia   . Hyperlipidemia   . Neck  rigidity    from cervical fusion- patient cannot turn head  . Pulmonary nodule    a. 9mm nodule seen on CT 09/2014 - f/u recommended 1 yr.  . Skin cancer   . Tobacco abuse     Past Surgical History:  Procedure Laterality Date  . ABDOMINAL AORTIC ENDOVASCULAR STENT GRAFT  2014  . APPENDECTOMY    . BACK SURGERY    . BIOPSY N/A 06/10/2013   Procedure: BIOPSY;  Surgeon: Rogene Houston, MD;  Location: [facility];  Service: Endoscopy;  Laterality: N/A;  . BIOPSY  12/03/2014   Procedure: BIOPSY (Gastroesophageal Junction);  Surgeon: Rogene Houston, MD;  Location: [facility];  Service: Endoscopy;;  . ESOPHAGEAL DILATION N/A 12/03/2014   Procedure: ESOPHAGEAL DILATION WITH 54FR AND 56FR MALONEY;  Surgeon: Rogene Houston, MD;  Location: [facility];  Service: Endoscopy;  Laterality: N/A;  . ESOPHAGOGASTRODUODENOSCOPY (EGD) WITH PROPOFOL N/A 06/10/2013   Procedure: ESOPHAGOGASTRODUODENOSCOPY (EGD) WITH PROPOFOL;  Surgeon: Rogene Houston, MD;  Location: [facility];  Service: Endoscopy;  Laterality: N/A;  . ESOPHAGOGASTRODUODENOSCOPY (EGD) WITH PROPOFOL N/A 12/03/2014   Procedure: ESOPHAGOGASTRODUODENOSCOPY (EGD) WITH PROPOFOL;  Surgeon: Rogene Houston, MD;  Location: [facility];  Service: Endoscopy;  Laterality: N/A;  . EVAR  06/20/10   for AAA repair   . MALONEY DILATION N/A 06/10/2013   Procedure: MALONEY DILATION 52 fr, 37 fr;  Surgeon: Rogene Houston, MD;  Location: [facility];  Service: Endoscopy;  Laterality: N/A;  . SPINAL FUSION     of neck and lower back     Home Medications:  Prior to Admission medications   Medication Sig Start Date End Date Taking? Authorizing Provider  acetaminophen (TYLENOL) 500 MG tablet Take 1,000 mg by mouth every 6 (six) hours as needed for headache (pain).   Yes [provider]  albuterol (PROAIR HFA) 108 (90 Base) MCG/ACT inhaler Inhale 2 puffs every 6 (six) hours as needed into the lungs. Patient taking differently: Inhale 2 puffs into the lungs every 6 (six) hours as  needed for wheezing or shortness of breath.  12/05/16  Yes Javier Glazier, MD  aspirin EC 81 MG tablet Take 81 mg by mouth daily.   Yes [provider]  budesonide-formoterol (SYMBICORT) 160-4.5 MCG/ACT inhaler Inhale 2 puffs into the lungs 2 (two) times daily. 06/04/16  Yes Javier Glazier, MD  diazepam (VALIUM) 5 MG tablet Take 5 mg by mouth daily as needed for anxiety.   Yes [provider]  esomeprazole (NEXIUM) 40 MG capsule TAKE 1 CAPSULE (40 MG TOTAL) BY MOUTH 2 (TWO) TIMES DAILY BEFORE A MEAL. Patient taking differently: Take 40 mg by mouth 2 (two) times daily before a meal.  01/18/17  Yes Rehman, Mechele Dawley, MD  losartan-hydrochlorothiazide (HYZAAR) 100-12.5 MG tablet Take one-half tablet daily by mouth Patient taking differently: Take 0.25 tablets by mouth daily as needed (SBP >130 or headache from high blood pressure).  12/30/14  Yes Sherren Mocha, MD  Oxycodone HCl 20 MG TABS Take 20 mg by mouth every 4 (four) hours. scheduled   Yes [provider]  sodium chloride (OCEAN) 0.65 % SOLN nasal spray Place 1 spray into both nostrils daily as needed for congestion.   Yes [provider]  Spacer/Aero-Holding Chambers (AEROCHAMBER MV) inhaler Use as instructed Patient taking differently: See admin instructions. Use as instructed with Symbicort 11/09/15  Yes Javier Glazier, MD    Inpatient Medications: Scheduled Meds: . aspirin EC  81 mg Oral Daily  . enoxaparin (LOVENOX) injection  40 mg Subcutaneous Q24H  . famotidine  40 mg Oral BID  . fluticasone furoate-vilanterol  1 puff Inhalation Daily  . losartan  50 mg Oral Daily  . sodium chloride flush  3 mL Intravenous Q12H   Continuous Infusions: . sodium chloride     PRN Meds: aluminum hydroxide-magnesium carbonate, nicotine, nicotine polacrilex, oxyCODONE  Allergies:    Allergies  Allergen Reactions  . Benazepril Hcl Other (See Comments)    Can't breathe  . Pantoprazole Sodium Other  (See Comments)    Can't breathe  . Amlodipine Swelling    Social History:   Social History   Socioeconomic History  . Marital status: Married    Spouse name: Not on file  . Number of children: Not on file  . Years of education: Not on file  . Highest education level: Not on file  Social Needs  . Financial resource strain: Not on file  . Food insecurity - worry: Not on file  . Food insecurity - inability: Not on file  . Transportation needs - medical: Not on file  . Transportation needs - non-medical: Not on file  Occupational History  . Not on file  Tobacco Use  . Smoking status: Current Every Day Smoker    Packs/day: 1.00    Years: 48.00    Pack years: 48.00    Types: Cigarettes    Start date: 12/16/1965  .  Smokeless tobacco: Never Used  Substance and Sexual Activity  . Alcohol use: No    Alcohol/week: 0.0 oz  . Drug use: No  . Sexual activity: Not on file  Other Topics Concern  . Not on file  Social History Narrative   Originally from [place], [place]. Always lived in [place]. Travel to the [place] in March 2016. Has traveled to [place], [place], [place], & [place]. 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 No known asbestos exposure. Has 2 parakeets currently. Remotely raised parakeets, love birds, & cockatiels in a different house. No known mold or hot tub exposure.     Family History:    Family History  Problem Relation Age of Onset  . Atrial fibrillation Mother   . Heart disease Mother        Patient unclear of what kind  . Deep vein thrombosis Mother   . Hyperlipidemia Mother   . Hypertension Mother   . Stroke Father   . Diabetes Father   . Heart disease Father        Patient unclear of what kind  . Kidney disease Father   . Hyperlipidemia Father   . Hypertension Father   . Diabetes Sister   . Heart disease Sister        Patient unclear of what kind  . Brain cancer Sister   . Seizures  Sister   . Deep vein thrombosis Other   . Pulmonary embolism Other   . Cancer Paternal Grandmother   . Lung disease Neg Hx   . Colon cancer Neg Hx   . Esophageal cancer Neg Hx   . Rectal cancer Neg Hx   . Stomach cancer Neg Hx      ROS:  Please see the history of present illness.  ROS  All other ROS reviewed and negative.     Physical Exam/Data:   Vitals:   02/03/17 2115 02/03/17 2311 02/04/17 0448 02/04/17 0743  BP: 121/90 118/79 114/73 110/84  Pulse: (!) 48 (!) 58 (!) 50 (!) 56  Resp: 14 14 15 15   Temp: 98.1 F (36.7 C) 97.9 F (36.6 C) 97.8 F (36.6 C) 98.3 F (36.8 C)  TempSrc: Oral Oral Oral Oral  SpO2: 98% 96% 96% 97%  Weight:   164 lb 14.4 oz (74.8 kg)   Height:        Intake/Output Summary (Last 24 hours) at 02/04/2017 0839 Last data filed at 02/03/2017 2202 Gross per 24 hour  Intake 3 ml  Output -  Net 3 ml   Filed Weights   02/03/17 1359 02/03/17 1800 02/04/17 0448  Weight: 170 lb (77.1 kg) 166 lb 1.6 oz (75.3 kg) 164 lb 14.4 oz (74.8 kg)   Body mass index is 24.35 kg/m.  General:  Well nourished, well developed, in no acute distress HEENT: rigid neck Lymph: no adenopathy Neck: no JVD Endocrine:  No thryomegaly Vascular: No carotid bruits; FA pulses 2+ bilaterally without bruits  Cardiac:  normal S1, S2; RRR; no murmur  Lungs:  clear to auscultation bilaterally, no wheezing, rhonchi or rales  Abd: soft, nontender, no hepatomegaly  Ext: no edema Musculoskeletal:  No deformities, BUE and BLE strength normal and equal Skin: warm and dry  Neuro:  CNs 2-12 intact, no focal abnormalities noted Psych:  Normal affect   EKG:  The EKG was personally reviewed and demonstrates: sinus bradycardia 46 bpm  Telemetry:  Telemetry was  personally reviewed and demonstrates:  NSR in the mid 60s currently  Relevant CV Studies: LHC-  pending   Laboratory Data:  Chemistry Recent Labs  Lab 02/03/17 1355 02/04/17 0525  NA 139 137  K 3.9 4.0  CL 105 103    CO2 24 23  GLUCOSE 115* 81  BUN 10 11  CREATININE 1.39* 1.19  CALCIUM 9.1 8.8*  GFRNONAA 52* >60  GFRAA >60 >60  ANIONGAP 10 11    Recent Labs  Lab 02/04/17 0525  PROT 6.8  ALBUMIN 3.6  AST 20  ALT 15*  ALKPHOS 44  BILITOT 0.8   Hematology Recent Labs  Lab 02/03/17 1355 02/04/17 0525  WBC 11.1* 9.7  RBC 5.03 4.65  HGB 15.3 14.7  HCT 44.5 41.4  MCV 88.5 89.0  MCH 30.4 31.6  MCHC 34.4 35.5  RDW 13.2 13.6  PLT 255 230   Cardiac Enzymes Recent Labs  Lab 02/03/17 1659 02/03/17 2224 02/04/17 0525  TROPONINI <0.03 <0.03 <0.03    Recent Labs  Lab 02/03/17 1416  TROPIPOC 0.00    BNPNo results for input(s): BNP, PROBNP in the last 168 hours.  DDimer No results for input(s): DDIMER in the last 168 hours.  Radiology/Studies:  Dg Chest 2 View  Result Date: 02/03/2017 CLINICAL DATA:  Left-sided chest pain since yesterday. Shortness of breath. Nausea and dizziness. EXAM: CHEST  2 VIEW COMPARISON:  02/27/2016 FINDINGS: Mild pectus excavatum deformity. Midline trachea. Normal heart size. Atherosclerosis in the transverse aorta. No pleural effusion or pneumothorax. Lower cervical spine fixation. Mild volume loss at the left lung base. Suspect mild interstitial lung disease in the subpleural lower lobes, similar. IMPRESSION: No acute cardiopulmonary disease. Aortic Atherosclerosis (ICD10-I70.0). Suspect subtle interstitial lung disease at the bases. Consider nonemergent outpatient high-resolution chest CT, especially if the patient has chronic shortness of breath. Electronically Signed   By: Abigail Miyamoto M.D.   On: 02/03/2017 14:21    Assessment and Plan:   1. Chest Pain: although CP is somewhat atypical and cardiac enzymes are negative x 3, he had a chest CT in 2017 that showed heavily calcified coronaries. In addition, he has several other cardiac risk factors, including known PVD, s/p EVAR for AAA, tobacco abuse, HTN and HLD. Given his risk factors and CP, we have  recommended definitive LHC. We have reviewed the risks, indications, and alternatives to cardiac catheterization and possible angioplasty/stenting with the patient. Risks include but are not limited to bleeding, infection, vascular injury, stroke, myocardial infection, arrhythmia, kidney injury, radiation-related injury in the case of prolonged fluoroscopy use, emergency cardiac surgery, and death. The patient understands the risks of serious complication is low (<5%). We will add on case for today.     For questions or updates, please contact [facility] Please consult [email or web address] for contact info under Cardiology/STEMI.   Signed, Lyda Jester, PA-C  02/04/2017 8:39 AM   Personally seen and examined. Agree with above.  67 year old with known severe peripheral vascular disease status post EVAR AAA, ongoing tobacco use, hypertension, hyperlipidemia with chest pain with fairly atypical features but some concerning as well, negative troponin, EKG with bradycardia otherwise no ischemic changes.  Personally reviewed his last CT scan from 2017 and he has severe coronary artery calcification including the left main, LAD from proximal to mid and circumflex in the proximal region.  Did not see any RCA calcification.  Prior nuclear stress test approximately 2 years ago did not show any ischemia.  He stated that he felt a  prickly-like sensation under his left breast, left axillary region yesterday but also had symptoms of left arm pressure a few days ago that was concerning to him.  He also had the sensation of heat, hot flash, felt sweaty, went outside to try to cool off.  Exam reveals normal radial pulse, overall no wheezes he is alert heart without any rubs, no murmurs regular rate and rhythm.  -With his multiple cardiac risk factors, severe coronary artery calcification noted on CT scan, symptoms with both typical and atypical features, peripheral vascular disease, I think it makes sense for [place]  to proceed with cardiac catheterization for definitive diagnosis.  Given his risk factors, he could have severe CAD, perhaps even left main disease.  He is amenable to catheterization.  Continue with aggressive secondary prevention.  Candee Furbish, MD

## 2017-02-04 NOTE — Research (Signed)
OPTIMIZE Informed Consent   Subject Name: [name]  Subject met inclusion and exclusion criteria.  The informed consent form, study requirements and expectations were reviewed with the subject and questions and concerns were addressed prior to the signing of the consent form.  The subject verbalized understanding of the trail requirements.  The subject agreed to participate in the OPTIMIZE trial and signed the informed consent.  The informed consent was obtained prior to performance of any protocol-specific procedures for the subject.  A copy of the signed informed consent was given to the subject and a copy was placed in the subject's medical record. Only applicable if randomized.   Philemon Kingdom D 02/04/2017, 1129 AM

## 2017-02-05 ENCOUNTER — Encounter (HOSPITAL_COMMUNITY): Payer: Self-pay | Admitting: Cardiovascular Disease

## 2017-02-05 MED FILL — Nitroglycerin IV Soln 100 MCG/ML in D5W: INTRA_ARTERIAL | Qty: 10 | Status: AC

## 2017-02-05 NOTE — Research (Signed)
CADFEM Informed Consent   Subject Name: [name]  Subject met inclusion and exclusion criteria.  The informed consent form, study requirements and expectations were reviewed with the subject and questions and concerns were addressed prior to the signing of the consent form.  The subject verbalized understanding of the trail requirements.  The subject agreed to participate in the CADFEM trial and signed the informed consent.  The informed consent was obtained prior to performance of any protocol-specific procedures for the subject.  A copy of the signed informed consent was given to the subject and a copy was placed in the subject's medical record.  Christena Flake 02/04/2017, 10:57 AM

## 2017-02-12 NOTE — Progress Notes (Signed)
Established EVAR   History of Present Illness   [name] is a 67 y.o. (06/04/1950) male who presents for routine follow up s/p EVAR (Date: 06/20/10).  Most recent EVAR duplex (Date: 02/15/2017) demonstrates: no endoleak and small sac size.  Most recent CTA (Date: 01/25/16) demonstrates: no endoleak and small shrunken sac size.  The patient has not had back or abdominal pain.  The patient's PMH, PSH, SH, and FamHx are unchanged from 02/10/16.  Current Outpatient Medications  Medication Sig Dispense Refill  . acetaminophen (TYLENOL) 500 MG tablet Take 1,000 mg by mouth every 6 (six) hours as needed for headache (pain).    Marland Kitchen albuterol (PROAIR HFA) 108 (90 Base) MCG/ACT inhaler Inhale 2 puffs every 6 (six) hours as needed into the lungs. (Patient taking differently: Inhale 2 puffs into the lungs every 6 (six) hours as needed for wheezing or shortness of breath. ) 1 Inhaler 6  . aspirin EC 81 MG tablet Take 81 mg by mouth daily.    Marland Kitchen atorvastatin (LIPITOR) 80 MG tablet Take 1 tablet (80 mg total) by mouth daily at 6 PM. 30 tablet 0  . budesonide-formoterol (SYMBICORT) 160-4.5 MCG/ACT inhaler Inhale 2 puffs into the lungs 2 (two) times daily. 1 Inhaler 6  . esomeprazole (NEXIUM) 40 MG capsule TAKE 1 CAPSULE (40 MG TOTAL) BY MOUTH 2 (TWO) TIMES DAILY BEFORE A MEAL. (Patient taking differently: Take 40 mg by mouth 2 (two) times daily before a meal. ) 60 capsule 5  . losartan-hydrochlorothiazide (HYZAAR) 100-12.5 MG tablet Take one-half tablet daily by mouth (Patient taking differently: Take 0.25 tablets by mouth daily as needed (SBP >130 or headache from high blood pressure). ) 30 tablet 3  . nicotine (NICODERM CQ - DOSED IN MG/24 HOURS) 14 mg/24hr patch Place 1 patch (14 mg total) onto the skin daily as needed (tobacco craving). 28 patch 0  . Oxycodone HCl 20 MG TABS Take 20 mg by mouth every 4 (four) hours. scheduled    . sodium chloride (OCEAN) 0.65 % SOLN nasal spray Place 1 spray into both  nostrils daily as needed for congestion.    Marland Kitchen Spacer/Aero-Holding Chambers (AEROCHAMBER MV) inhaler Use as instructed (Patient taking differently: See admin instructions. Use as instructed with Symbicort) 1 each 0   No current facility-administered medications for this visit.     On ROS today: recent cardiac catherization, no intermittent claudication    Physical Examination   Vitals:   02/15/17 0850  BP: 114/77  Pulse: (!) 58  Resp: 16  Temp: 98.2 F (36.8 C)  TempSrc: Oral  SpO2: 97%  Weight: 175 lb (79.4 kg)  Height: 5\' 9"  (1.753 m)   Body mass index is 25.84 kg/m.  General Alert, O x 3, WD, NAD  Pulmonary Sym exp, good B air movt, CTA B  Cardiac RRR, Nl S1, S2, no Murmurs, No rubs, No S3,S4  Vascular Vessel Right Left  Radial Palpable Palpable  Brachial Palpable Palpable  Carotid Palpable, No Bruit Palpable, No Bruit  Aorta Not palpable N/A  Femoral Palpable Palpable  Popliteal Not palpable Not palpable  PT Not examined as boots on Not examined as boots on  DP Not examined as boots on Not examined as boots on    Gastro- intestinal soft, non-distended, non-tender to palpation, No guarding or rebound, no HSM, no masses, no CVAT B, No palpable prominent aortic pulse,    Musculo- skeletal M/S 5/5 throughout  , Extremities without ischemic changes  Neurologic Pain and light touch intact in extremities , Motor exam as listed above    Non-Invasive Vascular Imaging   EVAR Duplex (02/15/2017)  AAA sac size: 2.9 cm x 3.6 cm (Original 5.2 cm x 5.4 cm)  No endoleak detected   Medical Decision Making   [name] is a 67 y.o. male who presents s/p EVAR.  Pt is asymptomatic with decreased sac size.   I discussed with the patient the importance of surveillance of the endograft.  The next endograft duplex will be scheduled for 1 year.  This patient's studies have been stable since his procedure, consistent with patent functional EVAR.   Pt will continue to  follow up with my NP.  The patient will follow up with [place] in 1 year with these studies.  Thank you for allowing [place] to participate in this patient's care.   Adele Barthel, MD, FACS [facility] Office: [phone] Pager: [phone]

## 2017-02-13 NOTE — Research (Signed)
CADFEM Informed Consent      Subject Name: [name]    Subject met inclusion and exclusion criteria.  The informed consent form, study requirements and expectations were reviewed with the subject and questions and concerns were addressed prior to the signing of the consent form.  The subject verbalized understanding of the trail requirements.  The subject agreed to participate in the CADFEM trial and signed the informed consent.  The informed consent was obtained prior to performance of any protocol-specific procedures for the subject.  A copy of the signed informed consent was given to the subject and a copy was placed in the subject's medical record. This patient was consented by Philemon Kingdom at 11:05 a.m. on 02-04-17.           [name]

## 2017-02-15 ENCOUNTER — Encounter: Payer: Self-pay | Admitting: Vascular Surgery

## 2017-02-15 ENCOUNTER — Other Ambulatory Visit: Payer: Self-pay

## 2017-02-15 ENCOUNTER — Ambulatory Visit (HOSPITAL_COMMUNITY)
Admission: RE | Admit: 2017-02-15 | Discharge: 2017-02-15 | Disposition: A | Discharge status: Home / Self Care | Payer: Medicaid Other | Source: Ambulatory Visit | Attending: Vascular Surgery | Admitting: Vascular Surgery

## 2017-02-15 ENCOUNTER — Ambulatory Visit (INDEPENDENT_AMBULATORY_CARE_PROVIDER_SITE_OTHER): Payer: Medicaid Other | Admitting: Vascular Surgery

## 2017-02-15 VITALS — BP 114/77 | HR 58 | Temp 98.2°F | Resp 16 | Ht 69.0 in | Wt 175.0 lb

## 2017-02-15 DIAGNOSIS — I714 Abdominal aortic aneurysm, without rupture, unspecified: Secondary | ICD-10-CM

## 2017-03-02 ENCOUNTER — Other Ambulatory Visit: Payer: Self-pay | Admitting: Internal Medicine

## 2017-03-05 ENCOUNTER — Ambulatory Visit (INDEPENDENT_AMBULATORY_CARE_PROVIDER_SITE_OTHER): Payer: Medicaid Other | Admitting: Pulmonary Disease

## 2017-03-05 ENCOUNTER — Encounter: Payer: Self-pay | Admitting: Pulmonary Disease

## 2017-03-05 VITALS — BP 122/74 | HR 71 | Ht 69.0 in | Wt 176.0 lb

## 2017-03-05 DIAGNOSIS — F17200 Nicotine dependence, unspecified, uncomplicated: Secondary | ICD-10-CM

## 2017-03-05 DIAGNOSIS — R911 Solitary pulmonary nodule: Secondary | ICD-10-CM

## 2017-03-05 DIAGNOSIS — J849 Interstitial pulmonary disease, unspecified: Secondary | ICD-10-CM

## 2017-03-05 MED ORDER — FLUTICASONE PROPIONATE 50 MCG/ACT NA SUSP: 2.0000 | Freq: Every day | NASAL | 2 refills | Status: DC

## 2017-03-05 NOTE — Progress Notes (Signed)
[name]    [id number]    10/07/1950  Primary Care Physician:Mann, Carlean Jews, PA-C  Referring Physician: Cory Munch, PA-C [address] [place], [place] [address]  Chief complaint:   Follow-up for emphysema, active smoker, ? ILD  HPI: 67 year old with history of emphysema, hemoptysis, GERD, active smoker hypertension, coronary artery disease, AAA.  He was previously followed by Dr. Ashok Cordia.  History noted for pulmonary emphysema with no obstruction on PFTs.  History of hemoptysis several years ago which has resolved, no parenchymal abnormalities on chest imaging.  He also has history of severe GERD status post esophageal dilatation in 2016 and 2018.  He continues on antiacid medication. Restarted on Symbicort at last appointment in the pulmonary clinic.  However he is using it only intermittently since he does not want to be dependent on inhaler therapy Complains of chronic congestion with white/greenish mucus, chronic dyspnea on exertion.  Denies any wheezing, fevers, chills, hemoptysis. Also has sinus congestion, postnasal drip.  He was hospitalized in January 2019 for atypical chest pain.  Underwent left heart cath on 02/04/17 which was negative for any abnormality of his coronary arteries.  Pets: Has a dog, cat, chicken, [place], kidney file, [place]ac[place] in his yard.  He has a parakeet inside his house. Occupation: Used to work Biomedical engineer.  Currently retired Exposures: Exposure to fumes and dust in his line of work.  No mold, hot tub at home Smoking history: 50-pack-year smoking history.  Continues to smoke half pack per day Travel History: Used to travel while working.  No significant travel recently.  Outpatient Encounter Medications as of 03/05/2017  Medication Sig  . acetaminophen (TYLENOL) 500 MG tablet Take 1,000 mg by mouth every 6 (six) hours as needed for headache (pain).  Marland Kitchen albuterol (PROAIR HFA) 108 (90 Base) MCG/ACT inhaler Inhale 2  puffs every 6 (six) hours as needed into the lungs. (Patient taking differently: Inhale 2 puffs into the lungs every 6 (six) hours as needed for wheezing or shortness of breath. )  . aspirin EC 81 MG tablet Take 81 mg by mouth daily.  Marland Kitchen atorvastatin (LIPITOR) 80 MG tablet Take 1 tablet (80 mg total) by mouth daily at 6 PM.  . budesonide-formoterol (SYMBICORT) 160-4.5 MCG/ACT inhaler Inhale 2 puffs into the lungs 2 (two) times daily.  Marland Kitchen esomeprazole (NEXIUM) 40 MG capsule TAKE 1 CAPSULE (40 MG TOTAL) BY MOUTH 2 (TWO) TIMES DAILY BEFORE A MEAL. (Patient taking differently: Take 40 mg by mouth 2 (two) times daily before a meal. )  . losartan-hydrochlorothiazide (HYZAAR) 100-12.5 MG tablet Take one-half tablet daily by mouth (Patient taking differently: Take 0.25 tablets by mouth daily as needed (SBP >130 or headache from high blood pressure). )  . Oxycodone HCl 20 MG TABS Take 20 mg by mouth every 4 (four) hours. scheduled  . sodium chloride (OCEAN) 0.65 % SOLN nasal spray Place 1 spray into both nostrils daily as needed for congestion.  Marland Kitchen Spacer/Aero-Holding Chambers (AEROCHAMBER MV) inhaler Use as instructed (Patient taking differently: See admin instructions. Use as instructed with Symbicort)  . [DISCONTINUED] nicotine (NICODERM CQ - DOSED IN MG/24 HOURS) 14 mg/24hr patch Place 1 patch (14 mg total) onto the skin daily as needed (tobacco craving).   No facility-administered encounter medications on file as of 03/05/2017.     Allergies as of 03/05/2017 - Review Complete 03/05/2017  Allergen Reaction Noted  . Benazepril hcl Other (See Comments) 06/01/2010  .  Pantoprazole sodium Other (See Comments) 06/01/2010  . Amlodipine Swelling 01/18/2015    Past Medical History:  Diagnosis Date  . AAA (abdominal aortic aneurysm) ([place])    a. s/p EVAR 05/2010.  . Back pain   . CKD (chronic kidney disease), stage II   . Degenerative disk disease    a. Chronic back pain with ridiculopathy.  . Diverticulosis    . Emphysema of lung ([place])   . Essential hypertension   . GERD (gastroesophageal reflux disease)   . Hiatal hernia   . Hyperlipidemia   . 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 Pulmonary nodule    a. 71mm nodule seen on CT 09/2014 - f/u recommended 1 yr.  . Skin cancer   . Tobacco abuse     Past Surgical History:  Procedure Laterality Date  . ABDOMINAL AORTIC ENDOVASCULAR STENT GRAFT  2014  . APPENDECTOMY    . BACK SURGERY    . BIOPSY N/A 06/10/2013   Procedure: BIOPSY;  Surgeon: Rogene Houston, MD;  Location: [facility];  Service: Endoscopy;  Laterality: N/A;  . BIOPSY  12/03/2014   Procedure: BIOPSY (Gastroesophageal Junction);  Surgeon: Rogene Houston, MD;  Location: [facility];  Service: Endoscopy;;  . ESOPHAGEAL DILATION N/A 12/03/2014   Procedure: ESOPHAGEAL DILATION WITH 54FR AND 56FR MALONEY;  Surgeon: Rogene Houston, MD;  Location: [facility];  Service: Endoscopy;  Laterality: N/A;  . ESOPHAGOGASTRODUODENOSCOPY (EGD) WITH PROPOFOL N/A 06/10/2013   Procedure: ESOPHAGOGASTRODUODENOSCOPY (EGD) WITH PROPOFOL;  Surgeon: Rogene Houston, MD;  Location: [facility];  Service: Endoscopy;  Laterality: N/A;  . ESOPHAGOGASTRODUODENOSCOPY (EGD) WITH PROPOFOL N/A 12/03/2014   Procedure: ESOPHAGOGASTRODUODENOSCOPY (EGD) WITH PROPOFOL;  Surgeon: Rogene Houston, MD;  Location: [facility];  Service: Endoscopy;  Laterality: N/A;  . EVAR  06/20/10   for AAA repair   . LEFT HEART CATH AND CORONARY ANGIOGRAPHY N/A 02/04/2017   Procedure: LEFT HEART CATH AND CORONARY ANGIOGRAPHY;  Surgeon: Lorretta Harp, MD;  Location: [facility];  Service: Cardiovascular;  Laterality: N/A;  . MALONEY DILATION N/A 06/10/2013   Procedure: MALONEY DILATION 52 fr, 31 fr;  Surgeon: Rogene Houston, MD;  Location: [facility];  Service: Endoscopy;  Laterality: N/A;  . SPINAL FUSION     of neck and lower back    Family History  Problem Relation Age of Onset  . Atrial fibrillation Mother   . Heart  disease Mother        Patient unclear of what kind  . Deep vein thrombosis Mother   . Hyperlipidemia Mother   . Hypertension Mother   . Stroke Father   . Diabetes Father   . Heart disease Father        Patient unclear of what kind  . Kidney disease Father   . Hyperlipidemia Father   . Hypertension Father   . Diabetes Sister   . Heart disease Sister        Patient unclear of what kind  . Brain cancer Sister   . Seizures Sister   . Deep vein thrombosis Other   . Pulmonary embolism Other   . Cancer Paternal Grandmother   . Lung disease Neg Hx   . Colon cancer Neg Hx   . Esophageal cancer Neg Hx   . Rectal cancer Neg Hx   . Stomach cancer Neg Hx     Social History   Socioeconomic History  . Marital status: Married  Spouse name: Not on file  . Number of children: Not on file  . Years of education: Not on file  . Highest education level: Not on file  Social Needs  . Financial resource strain: Not on file  . Food insecurity - worry: Not on file  . Food insecurity - inability: Not on file  . Transportation needs - medical: Not on file  . Transportation needs - non-medical: Not on file  Occupational History  . Not on file  Tobacco Use  . Smoking status: Current Every Day Smoker    Packs/day: 1.00    Years: 48.00    Pack years: 48.00    Types: Cigarettes    Start date: 12/16/1965  . Smokeless tobacco: Never Used  Substance and Sexual Activity  . Alcohol use: No    Alcohol/week: 0.0 oz  . Drug use: No  . Sexual activity: Not on file  Other Topics Concern  . Not on file  Social History Narrative   Originally from [place], [place]. Always lived in [place]. Travel to the [place] in March 2016. Has traveled to [place], [place], [place], & [place]. 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 No known asbestos exposure. Has 2 parakeets currently. Remotely raised parakeets, love birds, & cockatiels in a different  house. No known mold or hot tub exposure.     Review of systems: Review of Systems  Constitutional: Negative for fever and chills.  HENT: Negative.   Eyes: Negative for blurred vision.  Respiratory: as per HPI  Cardiovascular: Negative for chest pain and palpitations.  Gastrointestinal: Negative for vomiting, diarrhea, blood per rectum. Genitourinary: Negative for dysuria, urgency, frequency and hematuria.  Musculoskeletal: Negative for myalgias, back pain and joint pain.  Skin: Negative for itching and rash.  Neurological: Negative for dizziness, tremors, focal weakness, seizures and loss of consciousness.  Endo/Heme/Allergies: Negative for environmental allergies.  Psychiatric/Behavioral: Negative for depression, suicidal ideas and hallucinations.  All other systems reviewed and are negative.  Physical Exam: Blood pressure 122/74, pulse 71, height 5\' 9"  (1.753 m), weight 176 lb (79.8 kg), SpO2 95 %. Gen:      No acute distress HEENT:  EOMI, sclera anicteric Neck:     No masses; no thyromegaly Lungs:    Clear to auscultation bilaterally; normal respiratory effort CV:         Regular rate and rhythm; no murmurs Abd:      + bowel sounds; soft, non-tender; no palpable masses, no distension Ext:    No edema; adequate peripheral perfusion Skin:      Warm and dry; no rash Neuro: alert and oriented x 3 Psych: normal mood and affect  Data Reviewed: PFTs  12/31/14-FVC 3.44 [98%], FEV1 2.82 [85%], F/F 82, TLC 80%, DLCO 61% Moderate reduction in diffusion capacity.  No obstruction or restriction.  05/17/15-FVC 4.36 [82%], FEV1 3.28 [83%], F/F 75, DLCO 55% Normal spirometry.  Moderate reduction in diffusion capacity  6MWT 12/31/14:  Walked 326 meters / Baseline Sat 96% on RA / Nadir Sat 96% on RA  Imaging: CT abdomen pelvis 07/20/10-lung images show subtle basilar atelectasis CT scan 10/13/14-dependent bibasilar atelectasis, left upper lobe 3 mm nodule CT scan 03/31/15- atelectasis,  stable left upper lobe 3 mm nodule CT scan 10/18/15- atelectasis, stable left upper lobe 3 mm nodule. Chest x-ray 02/03/17-suspected mild interstitial lung disease in the lower lobes.  LABS 10/29/14 ALPHA-1 AT: MM (145)  Assessment:  Emphysema PFTs do not show any obstruction.  He is on Symbicort but is using it only intermittently.  He continues to smoke.  Encouraged smoking cessation.  Evaluation for interstitial lung disease Has bibasal atelectasis on imaging dating back to 2012.  We will get a high-resolution CT for further evaluation. Exposures noted for birds at home.  Consider serologies after review of CT scan.  Subcentimeter pulmonary nodule Has remained stable on follow-up imaging from 2016 02/2015.  This likely benign.  We will reevaluate on high res CT of the chest  Acid reflux, Barrett's esophagus Status post dilation by GI.  Continues on Nexium.  There is no evidence of aspiration by clinical history.  Chronic sinusitis, postnasal drip Recommended over-the-counter chlorphentermine and Flonase nasal spray.  Plan/Recommendations: - Continue inhalers as prescribed.  He is on Symbicort, being used as needed - Smoking cessation - High-resolution CT of the chest.  Marshell Garfinkel MD [facility] Pager [phone] 03/05/2017, 9:05 AM  CC: Cory Munch, PA-C

## 2017-03-05 NOTE — Patient Instructions (Addendum)
Please continue to work on smoking cessation Use Symbicort and albuterol as directed You can use chlorpheniramine over-the-counter 3 times a day and Flonase nasal spray for sinus congestion and postnasal drip We will schedule you for a CT chest without contrast Return to clinic after CT scan.

## 2017-03-05 NOTE — Addendum Note (Signed)
Addended by: Maryanna Shape A on: 03/05/2017 10:40 AM   Modules accepted: Orders

## 2017-03-07 ENCOUNTER — Other Ambulatory Visit: Payer: Self-pay | Admitting: Internal Medicine

## 2017-03-17 ENCOUNTER — Other Ambulatory Visit: Payer: Self-pay | Admitting: Internal Medicine

## 2017-03-20 ENCOUNTER — Other Ambulatory Visit: Payer: Self-pay | Admitting: Pulmonary Disease

## 2017-04-08 ENCOUNTER — Emergency Department (HOSPITAL_COMMUNITY): Payer: Medicaid Other

## 2017-04-08 ENCOUNTER — Other Ambulatory Visit (HOSPITAL_COMMUNITY): Payer: Medicaid Other

## 2017-04-08 ENCOUNTER — Encounter (HOSPITAL_COMMUNITY): Payer: Self-pay | Admitting: *Deleted

## 2017-04-08 ENCOUNTER — Other Ambulatory Visit: Payer: Self-pay

## 2017-04-08 ENCOUNTER — Emergency Department (HOSPITAL_COMMUNITY)
Admission: EM | Admit: 2017-04-08 | Discharge: 2017-04-08 | Disposition: A | Discharge status: Home / Self Care | Payer: Medicaid Other | Attending: Emergency Medicine | Admitting: Emergency Medicine

## 2017-04-08 DIAGNOSIS — F17210 Nicotine dependence, cigarettes, uncomplicated: Secondary | ICD-10-CM | POA: Insufficient documentation

## 2017-04-08 DIAGNOSIS — R319 Hematuria, unspecified: Secondary | ICD-10-CM | POA: Diagnosis not present

## 2017-04-08 DIAGNOSIS — Z7982 Long term (current) use of aspirin: Secondary | ICD-10-CM | POA: Insufficient documentation

## 2017-04-08 DIAGNOSIS — Z85828 Personal history of other malignant neoplasm of skin: Secondary | ICD-10-CM | POA: Diagnosis not present

## 2017-04-08 DIAGNOSIS — N182 Chronic kidney disease, stage 2 (mild): Secondary | ICD-10-CM | POA: Diagnosis not present

## 2017-04-08 DIAGNOSIS — R1011 Right upper quadrant pain: Secondary | ICD-10-CM | POA: Insufficient documentation

## 2017-04-08 DIAGNOSIS — I129 Hypertensive chronic kidney disease with stage 1 through stage 4 chronic kidney disease, or unspecified chronic kidney disease: Secondary | ICD-10-CM | POA: Diagnosis not present

## 2017-04-08 LAB — URINALYSIS, ROUTINE W REFLEX MICROSCOPIC
Bacteria, UA: NONE SEEN
Bilirubin Urine: NEGATIVE
Glucose, UA: NEGATIVE mg/dL
Ketones, ur: NEGATIVE mg/dL
Leukocytes, UA: NEGATIVE
Nitrite: NEGATIVE
Protein, ur: NEGATIVE mg/dL
Specific Gravity, Urine: 1.004 — ABNORMAL LOW (ref 1.005–1.030)
WBC, UA: NONE SEEN WBC/hpf (ref 0–5)
pH: 6 (ref 5.0–8.0)

## 2017-04-08 LAB — COMPREHENSIVE METABOLIC PANEL
ALT: 15 U/L — ABNORMAL LOW (ref 17–63)
AST: 20 U/L (ref 15–41)
Albumin: 3.9 g/dL (ref 3.5–5.0)
Alkaline Phosphatase: 51 U/L (ref 38–126)
Anion gap: 10 (ref 5–15)
BUN: 9 mg/dL (ref 6–20)
CO2: 25 mmol/L (ref 22–32)
Calcium: 8.9 mg/dL (ref 8.9–10.3)
Chloride: 105 mmol/L (ref 101–111)
Creatinine, Ser: 1.23 mg/dL (ref 0.61–1.24)
GFR calc Af Amer: >60 mL/min (ref >60)
GFR calc non Af Amer: >60 mL/min (ref >60)
Glucose, Bld: 107 mg/dL — ABNORMAL HIGH (ref 65–99)
Potassium: 4.1 mmol/L (ref 3.5–5.1)
Sodium: 140 mmol/L (ref 135–145)
Total Bilirubin: 0.6 mg/dL (ref 0.3–1.2)
Total Protein: 7.5 g/dL (ref 6.5–8.1)

## 2017-04-08 LAB — CBC
HCT: 40.2 % (ref 39.0–52.0)
Hemoglobin: 13.8 g/dL (ref 13.0–17.0)
MCH: 30.3 pg (ref 26.0–34.0)
MCHC: 34.3 g/dL (ref 30.0–36.0)
MCV: 88.4 fL (ref 78.0–100.0)
Platelets: 248 10*3/uL (ref 150–400)
RBC: 4.55 MIL/uL (ref 4.22–5.81)
RDW: 13.2 % (ref 11.5–15.5)
WBC: 9.3 10*3/uL (ref 4.0–10.5)

## 2017-04-08 LAB — LIPASE, BLOOD: Lipase: 25 U/L (ref 11–51)

## 2017-04-08 MED ORDER — GI COCKTAIL ~~LOC~~
30.0000 mL | Freq: Once | ORAL | Status: AC
  Administered 2017-04-08: 30 mL via ORAL
  Filled 2017-04-08: qty 30

## 2017-04-08 MED ORDER — ONDANSETRON 4 MG PO TBDP
4.0000 mg | ORAL_TABLET | Freq: Once | ORAL | Status: AC
  Administered 2017-04-08: 4 mg via ORAL
  Filled 2017-04-08: qty 1

## 2017-04-08 NOTE — ED Notes (Signed)
Pt went to scans. 

## 2017-04-08 NOTE — ED Triage Notes (Addendum)
Pt states RUQ pain that radiates to umbilicus and LLQ pain that radiates to L groin.  States woke up 4 days ago woke up with an erection and had green discharge.  Denies sexual activity.  States loose stool and some nausea, but denies urinary symptoms.  Took 20 mg oxycodone at 1245 and pain is improving.  Pt takes 20 mg 6 times per day for chronic pain.

## 2017-04-08 NOTE — ED Provider Notes (Signed)
Patient placed in Quick Look pathway, seen and evaluated   Chief Complaint: abdominal pain   HPI:   RUQ abdominal pain (under right rib) x 2 days with nausea and one episode of looser stool. Also mentions chronic left groin pain that radiates to scrotal area and anus ongoing for one year, has been told this was due to his chronic back pain, this pain is unchanged. States RUQ abdominal pain brought him to ED today. Takes oxycodone for chronic back pain, not improving RUQ abdominal pain.   ROS: No fevers, chills, vomiting, pleuritic CP. Gas chronic exertional dyspnea. H/o AAA s/p EVAR, emphysema, tobacco abuse, severe GERD s/p esophageal dilation, appendectomy.   Physical Exam:   Gen: No distress  Neuro: Awake and Alert  Skin: Warm    Focused Exam: Abdomen soft non distended without guarding, rigidity or rebound. Focal RUQ tenderness mild. Negative Murphy's and McBurney's. No CVAT. No obvious inguinal bulging or masses.    Initiation of care has begun. The patient has been counseled on the process, plan, and necessity for staying for the completion/evaluation, and the remainder of the medical screening examination    Kinnie Feil, PA-C 04/08/17 [place], Luis M. Cint[place], DO 04/08/17 1523

## 2017-04-08 NOTE — Discharge Instructions (Signed)
It was my pleasure taking care of you today!   Fortunately, your labs, urine and imaging are reassuring today.   I would like you to call your GI doctor in the morning to schedule follow-up for further discussion of your upper abdominal pain.  Continue taking your Nexium as directed.  I would also like you to call the urology clinic listed for further evaluation of hematuria (of blood in the urine) as we discussed.  At this time, there does not appear to be an acute, emergent cause for your abdominal pain. However, this does not mean that your abdominal pain may not become an emergency in the future. It is VERY important that you monitor your symptoms and return to the Emergency Department if you develop any of the following symptoms:  You have a fever.  You begin throwing up. You pass bloody or black tarry stools.  There is bright red blood in the stool. New or worsening symptoms develop. You have any questions or concerns.

## 2017-04-08 NOTE — ED Provider Notes (Signed)
[facility] EMERGENCY DEPARTMENT Provider Note   CSN: [id number] Arrival date & time: 04/08/17  1253     History   Chief Complaint Chief Complaint  Patient presents with  . Abdominal Pain    HPI [name] is a 67 y.o. male.  The history is provided by the patient and medical records. No language interpreter was used.  Abdominal Pain   Associated symptoms include nausea. Pertinent negatives include diarrhea, vomiting and constipation.   [name] is a 68 y.o. male  with a PMH of AAA s/p EVAR in 2012, CKD, severe GERD requiring esophageal dilation, HLD, chronic back pain who presents to the Emergency Department complaining of right upper quadrant abdominal pain which began yesterday.  Pain is constant, but will improve when he is lying still.  Will become worse when he is moving around.  Associated with nausea, but no emesis.  He denies any change in bowel habits.  He reports similar episode of pain about 1 year ago, but states that no one was able to tell him what was wrong at that time.  He has tried no medications prior to arrival for his symptoms.  Zofran given in triage improved nausea. He does take Nexium for reflux as well as oxycodone for chronic back pain.  He did take these medications today, but did not feel like either helped his symptoms.  He denies any fever, chills, chest pain or shortness of breath.  No change in his typical chronic back pain.  Patient additionally reports blood from penis about 1 month ago.  He states that he awoke in the middle of the night after "wet dream" where he saw blood from the penis.  He noticed blood in the urine for about 1 more day and this resolved without any intervention.  He denies any dysuria, urinary urgency or frequency. Has had no issues in the last month.   Past Medical History:  Diagnosis Date  . AAA (abdominal aortic aneurysm) ([place])    a. s/p EVAR 05/2010.  . Back pain   . CKD (chronic kidney disease),  stage II   . Degenerative disk disease    a. Chronic back pain with ridiculopathy.  . Diverticulosis   . Emphysema of lung ([place])   . Essential hypertension   . GERD (gastroesophageal reflux disease)   . Hiatal hernia   . Hyperlipidemia   . 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 Pulmonary nodule    a. 60mm nodule seen on CT 09/2014 - f/u recommended 1 yr.  . Skin cancer   . Tobacco abuse     Patient Active Problem List   Diagnosis Date Noted  . Atypical chest pain 02/03/2017  . Acute URI 12/05/2016  . Cough 11/09/2015  . Asthmatic bronchitis with acute exacerbation 08/03/2015  . Tobacco use disorder 05/17/2015  . Emphysema of lung ([place]) 10/29/2014  . CKD (chronic kidney disease), stage II   . Pulmonary nodule   . Dysphagia 05/07/2013  . GERD (gastroesophageal reflux disease) 05/07/2013  . Chest pain 09/06/2012  . Abdominal aneurysm without mention of rupture 10/20/2010  . Preop cardiovascular exam 06/01/2010  . Cigarette smoker 06/01/2010  . AAA (abdominal aortic aneurysm) ([place]) 06/01/2010  . HTN (hypertension) 06/01/2010    Past Surgical History:  Procedure Laterality Date  . ABDOMINAL AORTIC ENDOVASCULAR STENT GRAFT  2014  . APPENDECTOMY    . BACK SURGERY    . BIOPSY N/A  06/10/2013   Procedure: BIOPSY;  Surgeon: Rogene Houston, MD;  Location: [facility];  Service: Endoscopy;  Laterality: N/A;  . BIOPSY  12/03/2014   Procedure: BIOPSY (Gastroesophageal Junction);  Surgeon: Rogene Houston, MD;  Location: [facility];  Service: Endoscopy;;  . ESOPHAGEAL DILATION N/A 12/03/2014   Procedure: ESOPHAGEAL DILATION WITH 54FR AND 56FR MALONEY;  Surgeon: Rogene Houston, MD;  Location: [facility];  Service: Endoscopy;  Laterality: N/A;  . ESOPHAGOGASTRODUODENOSCOPY (EGD) WITH PROPOFOL N/A 06/10/2013   Procedure: ESOPHAGOGASTRODUODENOSCOPY (EGD) WITH PROPOFOL;  Surgeon: Rogene Houston, MD;  Location: [facility];  Service: Endoscopy;  Laterality: N/A;  .  ESOPHAGOGASTRODUODENOSCOPY (EGD) WITH PROPOFOL N/A 12/03/2014   Procedure: ESOPHAGOGASTRODUODENOSCOPY (EGD) WITH PROPOFOL;  Surgeon: Rogene Houston, MD;  Location: [facility];  Service: Endoscopy;  Laterality: N/A;  . EVAR  06/20/10   for AAA repair   . LEFT HEART CATH AND CORONARY ANGIOGRAPHY N/A 02/04/2017   Procedure: LEFT HEART CATH AND CORONARY ANGIOGRAPHY;  Surgeon: Lorretta Harp, MD;  Location: [facility];  Service: Cardiovascular;  Laterality: N/A;  . MALONEY DILATION N/A 06/10/2013   Procedure: MALONEY DILATION 52 fr, 31 fr;  Surgeon: Rogene Houston, MD;  Location: [facility];  Service: Endoscopy;  Laterality: N/A;  . SPINAL FUSION     of neck and lower back       Home Medications    Prior to Admission medications   Medication Sig Start Date End Date Taking? Authorizing Provider  albuterol (PROAIR HFA) 108 (90 Base) MCG/ACT inhaler Inhale 2 puffs every 6 (six) hours as needed into the lungs. Patient taking differently: Inhale 2 puffs into the lungs every 6 (six) hours as needed for wheezing or shortness of breath.  12/05/16  Yes Javier Glazier, MD  aspirin EC 81 MG tablet Take 81 mg by mouth daily.   Yes [provider]  atorvastatin (LIPITOR) 80 MG tablet Take 1 tablet (80 mg total) by mouth daily at 6 PM. 02/04/17  Yes Molt, Bethany, DO  budesonide-formoterol (SYMBICORT) 160-4.5 MCG/ACT inhaler Inhale 2 puffs into the lungs 2 (two) times daily. 06/04/16  Yes Javier Glazier, MD  esomeprazole (NEXIUM) 40 MG capsule TAKE 1 CAPSULE (40 MG TOTAL) BY MOUTH 2 (TWO) TIMES DAILY BEFORE A MEAL. Patient taking differently: Take 40 mg by mouth 2 (two) times daily before a meal.  01/18/17  Yes Rehman, Mechele Dawley, MD  fluticasone (FLONASE) 50 MCG/ACT nasal spray Place 2 sprays into both nostrils daily. 03/05/17  Yes [name], Praveen, MD  losartan-hydrochlorothiazide (HYZAAR) 100-12.5 MG tablet Take one-half tablet daily by mouth Patient taking differently: Take 0.25 tablets by  mouth daily as needed (SBP >130 or headache from high blood pressure).  12/30/14  Yes Sherren Mocha, MD  Oxycodone HCl 20 MG TABS Take 20 mg by mouth every 4 (four) hours.    Yes [provider]  Pediatric Multivit-Minerals-C (KIDS GUMMY BEAR VITAMINS) CHEW Chew 1-2 tablets by mouth every other day.   Yes [provider]  sodium chloride (OCEAN) 0.65 % SOLN nasal spray Place 1 spray into both nostrils 3 (three) times daily as needed for congestion.    Yes [provider]  Spacer/Aero-Holding Chambers (AEROCHAMBER MV) inhaler Use as instructed Patient taking differently: See admin instructions. Use as instructed with Symbicort 11/09/15  Yes Javier Glazier, MD    Family History Family History  Problem Relation Age of Onset  . Atrial fibrillation Mother   . Heart disease  Mother        Patient unclear of what kind  . Deep vein thrombosis Mother   . Hyperlipidemia Mother   . Hypertension Mother   . Stroke Father   . Diabetes Father   . Heart disease Father        Patient unclear of what kind  . Kidney disease Father   . Hyperlipidemia Father   . Hypertension Father   . Diabetes Sister   . Heart disease Sister        Patient unclear of what kind  . Brain cancer Sister   . Seizures Sister   . Deep vein thrombosis Other   . Pulmonary embolism Other   . Cancer Paternal Grandmother   . Lung disease Neg Hx   . Colon cancer Neg Hx   . Esophageal cancer Neg Hx   . Rectal cancer Neg Hx   . Stomach cancer Neg Hx     Social History Social History   Tobacco Use  . Smoking status: Current Every Day Smoker    Packs/day: 1.00    Years: 48.00    Pack years: 48.00    Types: Cigarettes    Start date: 12/16/1965  . Smokeless tobacco: Never Used  Substance Use Topics  . Alcohol use: No    Alcohol/week: 0.0 oz  . Drug use: No     Allergies   Benazepril hcl; Pantoprazole sodium; and Amlodipine   Review of Systems Review of Systems  Gastrointestinal:  Positive for abdominal pain and nausea. Negative for blood in stool, constipation, diarrhea and vomiting.  All other systems reviewed and are negative.    Physical Exam Updated Vital Signs BP (!) 141/77   Pulse (!) 59   Temp 98.2 F (36.8 C) (Oral)   Resp 18   Ht 5\' 8"  (1.727 m)   Wt 77.1 kg (170 lb)   SpO2 98%   BMI 25.85 kg/m   Physical Exam  Constitutional: He is oriented to person, place, and time. He appears well-developed and well-nourished. No distress.  Non-toxic appearing.  HENT:  Head: Normocephalic and atraumatic.  Cardiovascular: Normal rate, regular rhythm and normal heart sounds.  No murmur heard. Pulmonary/Chest: Effort normal and breath sounds normal. No respiratory distress.  Abdominal: Soft. Bowel sounds are normal. He exhibits no distension.  Tenderness to palpation to right upper quadrant. No rebound tenderness. Negative Murphy sign.  Musculoskeletal: He exhibits no edema.  Neurological: He is alert and oriented to person, place, and time.  Skin: Skin is warm and dry.  Nursing note and vitals reviewed.    ED Treatments / Results  Labs (all labs ordered are listed, but only abnormal results are displayed) Labs Reviewed  COMPREHENSIVE METABOLIC PANEL - Abnormal; Notable for the following components:      Result Value   Glucose, Bld 107 (*)    ALT 15 (*)    All other components within normal limits  URINALYSIS, ROUTINE W REFLEX MICROSCOPIC - Abnormal; Notable for the following components:   Color, Urine STRAW (*)    Specific Gravity, Urine 1.004 (*)    Hgb urine dipstick SMALL (*)    Squamous Epithelial / LPF 0-5 (*)    All other components within normal limits  LIPASE, BLOOD  CBC    EKG  EKG Interpretation None       Radiology Dg Ribs Unilateral W/chest Right  Result Date: 04/08/2017 CLINICAL DATA:  Right upper quadrant abdominal pain for 2 days with nausea. Chronic back pain.  Anterior lower right rib pain. EXAM: RIGHT RIBS AND CHEST -  3+ VIEW COMPARISON:  Chest x-ray dated 02/03/2017. Chest x-ray dated 02/27/2016. FINDINGS: Single-view of the chest and four views of the right ribs are provided. Heart size and mediastinal contours are within normal limits. Atherosclerotic changes noted at the aortic arch. Lungs are clear. No pleural effusion or pneumothorax seen. Osseous structures about the chest are unremarkable. Specifically, right-sided ribs appear normal. IMPRESSION: 1. No acute findings. Lungs are clear. Right-sided ribs appear normal. 2. Aortic atherosclerosis. Electronically Signed   By: Franki Cabot M.D.   On: 04/08/2017 14:50   US Abdomen Complete  Result Date: 04/08/2017 CLINICAL DATA:  Right upper quadrant pain history of aortic endovascular stent graft EXAM: ABDOMEN ULTRASOUND COMPLETE COMPARISON:  CT 01/25/2016 FINDINGS: Gallbladder: No gallstones or wall thickening visualized. No sonographic Murphy sign noted by sonographer. Common bile duct: Diameter: 5.4 mm Liver: No focal lesion identified. Within normal limits in parenchymal echogenicity. Portal vein is patent on color Doppler imaging with normal direction of blood flow towards the liver. IVC: No abnormality visualized. Pancreas: Visualized portion unremarkable. Spleen: Size and appearance within normal limits. Right Kidney: Length: 9.7 cm. Echogenicity within normal limits. No mass or hydronephrosis visualized. Left Kidney: Length: 9.3 cm. Echogenicity within normal limits. No hydronephrosis. Small cyst in the mid pole measuring 1.1 x 0.9 cm. Abdominal aorta: Status post endograft repair of the abdominal aorta. Maximum AP diameter of the distal aorta at 3.1 cm. Other findings: None. IMPRESSION: 1. Negative for gallstones or biliary dilatation 2. Endograft repair of the abdominal aorta, maximum diameter of 3.1 cm by ultrasound. 3. There are no acute abnormalities visualized. Electronically Signed   By: Donavan Foil M.D.   On: 04/08/2017 16:50    Procedures Procedures  (including critical care time)  Medications Ordered in ED Medications  ondansetron (ZOFRAN-ODT) disintegrating tablet 4 mg (4 mg Oral Given 04/08/17 1340)  gi cocktail (Maalox,Lidocaine,Donnatal) (30 mLs Oral Given 04/08/17 1729)     Initial Impression / Assessment and Plan / ED Course  I have reviewed the triage vital signs and the nursing notes.  Pertinent labs & imaging results that were available during my care of the patient were reviewed by me and considered in my medical decision making (see chart for details).    SHALON COUNCILMAN is a 67 y.o. male who presents to ED for RUQ abdominal pain x 2 days.  On exam, patient is afebrile, hemodynamically stable with tenderness to right upper quadrant.  No rebound tenderness or Murphy's sign.  Blood work reviewed and reassuring.  Chest x-ray with rib series obtained in triage with no acute findings.  Will obtain ultrasound for further evaluation.  Ultrasound with no acute abnormalities.  Endograft repair of abdominal aorta is stable with diameter of 3.1 cm.   On reevaluation, patient tolerating p.o. repeat abdominal exam with no peritoneal signs. Evaluation does not show pathology that would require ongoing emergent intervention or inpatient treatment.  Patient followed by [facility].  Recommended following up with them for further evaluation.  UA reviewed with small amount of hemoglobin, but otherwise reassuring. H&H wdl. She reports one episode of blood from penis last month. No urinary symptoms today. Urology follow-up.  Reasons to return to ER were discussed and all questions answered.  Final Clinical Impressions(s) / ED Diagnoses   Final diagnoses:  Right upper quadrant abdominal pain  RUQ pain  Hematuria, unspecified type    ED Discharge Orders    None  Jenyfer Trawick, Ozella Almond, PA-C 04/08/17 1747    Virgel Manifold, MD 04/09/17 1505

## 2017-04-08 NOTE — ED Notes (Signed)
Patient verbalizes understanding of discharge instructions. Opportunity for questioning and answers were provided. Armband removed by staff, pt discharged from ED ambulatory.   

## 2017-04-10 ENCOUNTER — Ambulatory Visit: Payer: Medicaid Other | Admitting: Gastroenterology

## 2017-04-10 ENCOUNTER — Encounter: Payer: Self-pay | Admitting: Gastroenterology

## 2017-04-10 VITALS — BP 130/80 | HR 72 | Ht 69.0 in | Wt 178.1 lb

## 2017-04-10 DIAGNOSIS — R1011 Right upper quadrant pain: Secondary | ICD-10-CM | POA: Diagnosis not present

## 2017-04-10 DIAGNOSIS — K219 Gastro-esophageal reflux disease without esophagitis: Secondary | ICD-10-CM

## 2017-04-10 MED ORDER — CYCLOBENZAPRINE HCL 10 MG PO TABS: 10.0000 mg | ORAL_TABLET | Freq: Three times a day (TID) | ORAL | 0 refills | Status: DC | PRN

## 2017-04-10 NOTE — Patient Instructions (Signed)
We have sent the following medications to your pharmacy for you to pick up at your convenience: Flexeril.   Please follow up with your primary care physician and Dr. Hardin Negus for you musculoskeletal pain.   Normal BMI (Body Mass Index- based on height and weight) is between 19 and 25. Your BMI today is Body mass index is 26.3 kg/m. Marland Kitchen Please consider follow up  regarding your BMI with your Primary Care Provider.  Thank you for choosing me and [facility].  Pricilla Riffle. Dagoberto Ligas., MD., Marval Regal

## 2017-04-10 NOTE — Progress Notes (Signed)
    History of Present Illness: This is a 67 year old male with right upper quadrant pain.  Symptoms began a few days ago and he presented to the ED for evaluation on 3/18.  Pain located just under right lower ribs and is described as coming and going. It improves when he is lying still and pain worsens when he moves.  Mild nausea and no other gastrointestinal symptoms.  Symptoms do not change with meals or bowel movements.  Right upper quadrant ultrasound was unremarkable.  CBC, CMP, lipase unremarkable.  He has ongoing lower back pain.  In addition he relates intermittent pain in his groin area that comes and goes. He noted a bloody discharge from his penis several days ago that has not recurred.  He has a history of GERD and esophageal dysmotility.  Reflux symptoms are well controlled.  Current Medications, Allergies, Past Medical History, Past Surgical History, Family History and Social History were reviewed in [employer] record.  Physical Exam: General: Well developed, well nourished, uncomfortable appearing, no acute distress Head: Normocephalic and atraumatic Eyes:  sclerae anicteric, EOMI Ears: Normal auditory acuity Mouth: No deformity or lesions Lungs: Clear throughout to auscultation Heart: Regular rate and rhythm; no murmurs, rubs or bruits Abdomen: Soft, mild RUQ tenderness, no chest wall tenderness and non distended. No masses, hepatosplenomegaly or hernias noted. Normal Bowel sounds Musculoskeletal: Symmetrical with no gross deformities  Pulses:  Normal pulses noted Extremities: No clubbing, cyanosis, edema or deformities noted Neurological: Alert oriented x 4, grossly nonfocal Psychological:  Alert and cooperative. Normal mood and affect  Assessment and Recommendations:  1. RUQ/right costal margin pain of musculoskeletal origin in a patient with chronic back pain. Flexeril 10 mg po tid prn. Further evaluation and follow up with PCP and pain clinic.     2. GERD, esophageal dysmotility.  Continue Nexium 40 mg po bid and standard antireflux measures.  3.  Groin pain and penile discharge.  Urology follow-up scheduled for tomorrow.

## 2017-06-04 ENCOUNTER — Ambulatory Visit (INDEPENDENT_AMBULATORY_CARE_PROVIDER_SITE_OTHER)
Admission: RE | Admit: 2017-06-04 | Discharge: 2017-06-04 | Disposition: A | Discharge status: Home / Self Care | Payer: Self-pay | Source: Ambulatory Visit | Attending: Pulmonary Disease | Admitting: Pulmonary Disease

## 2017-06-04 DIAGNOSIS — J849 Interstitial pulmonary disease, unspecified: Secondary | ICD-10-CM

## 2017-06-04 DIAGNOSIS — R911 Solitary pulmonary nodule: Secondary | ICD-10-CM

## 2017-06-05 ENCOUNTER — Ambulatory Visit: Payer: Medicaid Other | Admitting: Pulmonary Disease

## 2017-06-05 ENCOUNTER — Other Ambulatory Visit (INDEPENDENT_AMBULATORY_CARE_PROVIDER_SITE_OTHER): Payer: Medicaid Other

## 2017-06-05 ENCOUNTER — Encounter: Payer: Self-pay | Admitting: Pulmonary Disease

## 2017-06-05 VITALS — BP 136/74 | HR 77 | Ht 70.0 in | Wt 174.0 lb

## 2017-06-05 DIAGNOSIS — J849 Interstitial pulmonary disease, unspecified: Secondary | ICD-10-CM

## 2017-06-05 LAB — CBC WITH DIFFERENTIAL/PLATELET
BASOS ABS: 0.1 10*3/uL (ref 0.0–0.1)
Basophils Relative: 0.5 % (ref 0.0–3.0)
EOS ABS: 0.2 10*3/uL (ref 0.0–0.7)
Eosinophils Relative: 1.7 % (ref 0.0–5.0)
HEMATOCRIT: 39.2 % (ref 39.0–52.0)
HEMOGLOBIN: 13.5 g/dL (ref 13.0–17.0)
Lymphocytes Relative: 27.7 % (ref 12.0–46.0)
Lymphs Abs: 3.1 10*3/uL (ref 0.7–4.0)
MCHC: 34.5 g/dL (ref 30.0–36.0)
MCV: 88.3 fl (ref 78.0–100.0)
MONOS PCT: 7.4 % (ref 3.0–12.0)
Monocytes Absolute: 0.8 10*3/uL (ref 0.1–1.0)
Neutro Abs: 7.1 10*3/uL (ref 1.4–7.7)
Neutrophils Relative %: 62.7 % (ref 43.0–77.0)
Platelets: 342 10*3/uL (ref 150.0–400.0)
RBC: 4.43 Mil/uL (ref 4.22–5.81)
RDW: 14.2 % (ref 11.5–15.5)
WBC: 11.3 10*3/uL — AB (ref 4.0–10.5)

## 2017-06-05 NOTE — Progress Notes (Signed)
[name]    [id number]    [birth year]  Primary Care Physician:Mann, Carlean Jews, PA-C  Referring Physician: Cory Munch, PA-C [address] [place], [place] [address]  Chief complaint:   Follow-up for emphysema, active smoker, ? ILD  HPI: 67 year old with history of emphysema, hemoptysis, GERD, active smoker hypertension, coronary artery disease, AAA.  He was previously followed by Dr. Ashok Cordia.  History noted for pulmonary emphysema with no obstruction on PFTs.  History of hemoptysis several years ago which has resolved, no parenchymal abnormalities on chest imaging.  He also has history of severe GERD status post esophageal dilatation in 2016 and 2018.  He continues on antiacid medication. Restarted on Symbicort at last appointment in the pulmonary clinic.  However he is using it only intermittently since he does not want to be dependent on inhaler therapy Complains of chronic congestion with white/greenish mucus, chronic dyspnea on exertion.  Denies any wheezing, fevers, chills, hemoptysis. Also has sinus congestion, postnasal drip.  He was hospitalized in January 2019 for atypical chest pain.  Underwent left heart cath on 02/04/17 which was negative for any abnormality of his coronary arteries.  Pets: Has a dog, cat, chicken, [place], pheasent, peacock in his yard.  He has a parakeet inside his house. Occupation: Used to work Biomedical engineer.  Currently retired Exposures: Exposure to fumes and dust in his line of work.  No mold, hot tub at home Smoking history: 50-pack-year smoking history.  Continues to smoke half pack per day Travel History: Used to travel while working.  No significant travel recently.  Interim history: Stable symptoms.  Complains of dyspnea on exertion, chronic cough with sputum production, chills He is on Symbicort which he is using only once daily.  Outpatient Encounter Medications as of 06/05/2017  Medication Sig  . albuterol  (PROAIR HFA) 108 (90 Base) MCG/ACT inhaler Inhale 2 puffs every 6 (six) hours as needed into the lungs. (Patient taking differently: Inhale 2 puffs into the lungs every 6 (six) hours as needed for wheezing or shortness of breath. )  . aspirin EC 81 MG tablet Take 81 mg by mouth daily.  . budesonide-formoterol (SYMBICORT) 160-4.5 MCG/ACT inhaler Inhale 2 puffs into the lungs 2 (two) times daily.  . cyclobenzaprine (FLEXERIL) 10 MG tablet Take 1 tablet (10 mg total) by mouth 3 (three) times daily as needed for muscle spasms.  Marland Kitchen dextromethorphan-guaiFENesin (MUCINEX DM) 30-600 MG 12hr tablet Take 1 tablet by mouth 2 (two) times daily.  Marland Kitchen esomeprazole (NEXIUM) 40 MG capsule TAKE 1 CAPSULE (40 MG TOTAL) BY MOUTH 2 (TWO) TIMES DAILY BEFORE A MEAL. (Patient taking differently: Take 40 mg by mouth 2 (two) times daily before a meal. )  . fluticasone (FLONASE) 50 MCG/ACT nasal spray Place 2 sprays into both nostrils daily.  Marland Kitchen losartan-hydrochlorothiazide (HYZAAR) 100-12.5 MG tablet Take one-half tablet daily by mouth (Patient taking differently: Take 0.25 tablets by mouth daily as needed (SBP >130 or headache from high blood pressure). )  . Oxycodone HCl 20 MG TABS Take 20 mg by mouth every 4 (four) hours.   . Pediatric Multivit-Minerals-C (KIDS GUMMY BEAR VITAMINS) CHEW Chew 1-2 tablets by mouth every other day.  . sodium chloride (OCEAN) 0.65 % SOLN nasal spray Place 1 spray into both nostrils 3 (three) times daily as needed for congestion.   Marland Kitchen Spacer/Aero-Holding Chambers (AEROCHAMBER MV) inhaler Use as instructed (Patient taking differently: See admin instructions. Use as instructed with Symbicort)  . [  DISCONTINUED] atorvastatin (LIPITOR) 80 MG tablet Take 1 tablet (80 mg total) by mouth daily at 6 PM. (Patient not taking: Reported on 06/05/2017)   No facility-administered encounter medications on file as of 06/05/2017.     Allergies as of 06/05/2017 - Review Complete 06/05/2017  Allergen Reaction Noted    . Benazepril hcl Shortness Of Breath and Other (See Comments) 06/01/2010  . Pantoprazole sodium Shortness Of Breath and Other (See Comments) 06/01/2010  . Amlodipine Swelling 01/18/2015    Past Medical History:  Diagnosis Date  . AAA (abdominal aortic aneurysm) ([place])    a. s/p EVAR 05/2010.  . Back pain   . CKD (chronic kidney disease), stage II   . Degenerative disk disease    a. Chronic back pain with ridiculopathy.  . Diverticulosis   . Emphysema of lung ([facility])   . Essential hypertension   . GERD (gastroesophageal reflux disease)   . Hiatal hernia   . Hyperlipidemia   . 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 Pulmonary nodule    a. 36mm nodule seen on CT 09/2014 - f/u recommended 1 yr.  . Skin cancer   . Tobacco abuse     Past Surgical History:  Procedure Laterality Date  . ABDOMINAL AORTIC ENDOVASCULAR STENT GRAFT  2014  . APPENDECTOMY    . BACK SURGERY    . BIOPSY N/A 06/10/2013   Procedure: BIOPSY;  Surgeon: Rogene Houston, MD;  Location: [facility];  Service: Endoscopy;  Laterality: N/A;  . BIOPSY  12/03/2014   Procedure: BIOPSY (Gastroesophageal Junction);  Surgeon: Rogene Houston, MD;  Location: [facility];  Service: Endoscopy;;  . ESOPHAGEAL DILATION N/A 12/03/2014   Procedure: ESOPHAGEAL DILATION WITH 54FR AND 56FR MALONEY;  Surgeon: Rogene Houston, MD;  Location: [facility];  Service: Endoscopy;  Laterality: N/A;  . ESOPHAGOGASTRODUODENOSCOPY (EGD) WITH PROPOFOL N/A 06/10/2013   Procedure: ESOPHAGOGASTRODUODENOSCOPY (EGD) WITH PROPOFOL;  Surgeon: Rogene Houston, MD;  Location: [facility];  Service: Endoscopy;  Laterality: N/A;  . ESOPHAGOGASTRODUODENOSCOPY (EGD) WITH PROPOFOL N/A 12/03/2014   Procedure: ESOPHAGOGASTRODUODENOSCOPY (EGD) WITH PROPOFOL;  Surgeon: Rogene Houston, MD;  Location: [facility];  Service: Endoscopy;  Laterality: N/A;  . EVAR  06/20/10   for AAA repair   . LEFT HEART CATH AND CORONARY ANGIOGRAPHY N/A 02/04/2017   Procedure: LEFT  HEART CATH AND CORONARY ANGIOGRAPHY;  Surgeon: Lorretta Harp, MD;  Location: [facility];  Service: Cardiovascular;  Laterality: N/A;  . MALONEY DILATION N/A 06/10/2013   Procedure: MALONEY DILATION 52 fr, 54 fr;  Surgeon: Rogene Houston, MD;  Location: [facility];  Service: Endoscopy;  Laterality: N/A;  . SPINAL FUSION     of neck and lower back    Family History  Problem Relation Age of Onset  . Atrial fibrillation Mother   . Heart disease Mother        Patient unclear of what kind  . Deep vein thrombosis Mother   . Hyperlipidemia Mother   . Hypertension Mother   . Stroke Father   . Diabetes Father   . Heart disease Father        Patient unclear of what kind  . Kidney disease Father   . Hyperlipidemia Father   . Hypertension Father   . Diabetes Sister   . Heart disease Sister        Patient unclear of what kind  . Brain cancer Sister   . Seizures Sister   .  Deep vein thrombosis Other   . Pulmonary embolism Other   . Cancer Paternal Grandmother   . Lung disease Neg Hx   . Colon cancer Neg Hx   . Esophageal cancer Neg Hx   . Rectal cancer Neg Hx   . Stomach cancer Neg Hx     Social History   Socioeconomic History  . Marital status: Married    Spouse name: Not on file  . Number of children: Not on file  . Years of education: Not on file  . Highest education level: Not on file  Occupational History  . Not on file  Social Needs  . Financial resource strain: Not on file  . Food insecurity:    Worry: Not on file    Inability: Not on file  . Transportation needs:    Medical: Not on file    Non-medical: Not on file  Tobacco Use  . Smoking status: Current Every Day Smoker    Packs/day: 1.00    Years: 48.00    Pack years: 48.00    Types: Cigarettes    Start date: 12/16/1965  . Smokeless tobacco: Never Used  Substance and Sexual Activity  . Alcohol use: No    Alcohol/week: 0.0 oz  . Drug use: No  . Sexual activity: Not on file  Lifestyle  . Physical  activity:    Days per week: Not on file    Minutes per session: Not on file  . Stress: Not on file  Relationships  . Social connections:    Talks on phone: Not on file    Gets together: Not on file    Attends religious service: Not on file    Active member of club or organization: Not on file    Attends meetings of clubs or organizations: Not on file    Relationship status: Not on file  . Intimate partner violence:    Fear of current or ex partner: Not on file    Emotionally abused: Not on file    Physically abused: Not on file    Forced sexual activity: Not on file  Other Topics Concern  . Not on file  Social History Narrative   Originally from [place], [place]. Always lived in [place]. Travel to the [place] in March 2016. Has traveled to [place], [place], [place], & [place]. 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 No known asbestos exposure. Has 2 parakeets currently. Remotely raised parakeets, love birds, & cockatiels in a different house. No known mold or hot tub exposure.     Review of systems: Review of Systems  Constitutional: Negative for fever and chills.  HENT: Negative.   Eyes: Negative for blurred vision.  Respiratory: as per HPI  Cardiovascular: Negative for chest pain and palpitations.  Gastrointestinal: Negative for vomiting, diarrhea, blood per rectum. Genitourinary: Negative for dysuria, urgency, frequency and hematuria.  Musculoskeletal: Negative for myalgias, back pain and joint pain.  Skin: Negative for itching and rash.  Neurological: Negative for dizziness, tremors, focal weakness, seizures and loss of consciousness.  Endo/Heme/Allergies: Negative for environmental allergies.  Psychiatric/Behavioral: Negative for depression, suicidal ideas and hallucinations.  All other systems reviewed and are negative.  Physical Exam: Blood pressure 136/74, pulse 77, height 5\' 10"  (1.778 m), weight 174 lb  (78.9 kg), SpO2 98 %. Gen:      No acute distress HEENT:  EOMI, sclera anicteric Neck:     No  masses; no thyromegaly Lungs:    Clear to auscultation bilaterally; normal respiratory effort CV:         Regular rate and rhythm; no murmurs Abd:      + bowel sounds; soft, non-tender; no palpable masses, no distension Ext:    No edema; adequate peripheral perfusion Skin:      Warm and dry; no rash Neuro: alert and oriented x 3 Psych: normal mood and affect  Data Reviewed: PFTs  12/31/14-FVC 3.44 [98%], FEV1 2.82 [85%], F/F 82, TLC 80%, DLCO 61% Moderate reduction in diffusion capacity.  No obstruction or restriction.  05/17/15-FVC 4.36 [82%], FEV1 3.28 [83%], F/F 75, DLCO 55% Normal spirometry.  Moderate reduction in diffusion capacity  6MWT 12/31/14:  Walked 326 meters / Baseline Sat 96% on RA / Nadir Sat 96% on RA  Imaging: CT abdomen pelvis 07/20/10-lung images show subtle basilar atelectasis CT scan 10/13/14-dependent bibasilar atelectasis, left upper lobe 3 mm nodule CT scan 03/31/15- atelectasis, stable left upper lobe 3 mm nodule CT scan 10/18/15- atelectasis, stable left upper lobe 3 mm nodule. Chest x-ray 02/03/17-suspected mild interstitial lung disease in the lower lobes.  High res CT chest 06/04/17-bibasilar fibrosis with mild bronchiectasis.  No honeycombing.  Patchy tree-in-bud opacities in the right upper lobe.  Left main and coronary atheosclerosis I have reviewed the images personally.  LABS 10/29/14 ALPHA-1 AT: MM (145)  Assessment:  Emphysema PFTs do not show any obstruction.  He is on Symbicort but is using it only intermittently.  I have asked him to start taking the Symbicort 2 puffs twice daily He continues to smoke.  Encouraged smoking cessation.  Evaluation for interstitial lung disease CT scan reviewed lower lobe reticulation, scarring with no clear evidence of IPF This could be from hypersensitivity pneumonitis although the pattern of lower lobe predominance is  unusual Other possibilities include chronic aspiration.  I asked him to get rid of the [facility]  We will check labs for hypersensitivity pneumonitis and ILD Follow spirometry, diffusion capacity, 6 MW Check sputum for tree-in-bud opacities of the right upper lobe.  Subcentimeter pulmonary nodule Has remained stable on follow-up imaging from 2016 to 2017.  This likely benign.    Acid reflux, Barrett's esophagus Status post dilation by GI.  Continues on Nexium.    Chronic sinusitis, postnasal drip Recommended over-the-counter chlorphentermine and Flonase nasal spray.  Health maintenance Declined vaccination  Plan/Recommendations: - Start using the Symbicort twice daily - Smoking cessation - CBC differential, blood allergy profile, ILD panel - Sputum culture  Marshell Garfinkel MD [facility] 06/05/2017, 9:05 AM  CC: Cory Munch, PA-C

## 2017-06-05 NOTE — Patient Instructions (Addendum)
Start using the Symbicort 2 puffs twice daily. We will check some labs today including CBC differential, blood allergy profile, hypersensitivity panel, ANA, factor, CCP, Jo 1, SSA, SSB, ANCA Check sputum for AFB, fungus, regular cultures. Continue to work on smoking cessation. We will repeat spirometry, diffusion capacity 6-minute walk test  Follow-up in 3 months.

## 2017-06-07 LAB — RESPIRATORY ALLERGY PROFILE REGION II ~~LOC~~
Allergen, Cedar tree, t12: <0.1 kU/L
Allergen, Comm Silver Birch, t9: <0.1 kU/L
Allergen, D pternoyssinus,d7: <0.1 kU/L
Allergen, Mouse Urine Protein, e78: <0.1 kU/L
Allergen, Mulberry, t76: <0.1 kU/L
Aspergillus fumigatus, m3: <0.1 kU/L
Bermuda Grass: <0.1 kU/L
CLASS: 0
CLASS: 0
CLASS: 0
CLASS: 0
CLASS: 0
CLASS: 0
CLASS: 0
CLASS: 0
CLASS: 0
CLASS: 0
CLASS: 2
Cat Dander: 3.44 kU/L — ABNORMAL HIGH
Class: 0
Class: 0
Class: 0
Class: 0
Class: 0
Class: 0
Class: 0
Class: 0
Class: 0
Class: 0
Class: 0
Class: 0
Class: 1
D. farinae: <0.1 kU/L
DOG DANDER: 0.37 kU/L — AB
IGE (IMMUNOGLOBULIN E), SERUM: 202 kU/L — AB (ref <=114)
Pecan/Hickory Tree IgE: <0.1 kU/L
Rough Pigweed  IgE: <0.1 kU/L
Sheep Sorrel IgE: <0.1 kU/L

## 2017-06-07 LAB — ANA,IFA RA DIAG PNL W/RFLX TIT/PATN: Anti Nuclear Antibody(ANA): POSITIVE — AB

## 2017-06-07 LAB — ANTI-NUCLEAR AB-TITER (ANA TITER): ANA Titer 1: 1:320 {titer} — ABNORMAL HIGH

## 2017-06-07 LAB — ANCA SCREEN W REFLEX TITER: ANCA Screen: NEGATIVE

## 2017-06-07 LAB — INTERPRETATION:

## 2017-06-07 LAB — SJOGREN'S SYNDROME ANTIBODS(SSA + SSB)
SSA (RO) (ENA) ANTIBODY, IGG: NEGATIVE AI
SSB (LA) (ENA) ANTIBODY, IGG: NEGATIVE AI

## 2017-06-10 LAB — HYPERSENSITIVITY PNEUMONITIS
A. PULLULANS ABS: NEGATIVE
A.Fumigatus #1 Abs: NEGATIVE
Micropolyspora faeni, IgG: NEGATIVE
Pigeon Serum Abs: NEGATIVE
THERMOACT. SACCHARII: NEGATIVE
THERMOACTINOMYCES VULGARIS IGG: NEGATIVE

## 2017-06-12 ENCOUNTER — Ambulatory Visit (INDEPENDENT_AMBULATORY_CARE_PROVIDER_SITE_OTHER): Payer: Medicaid Other | Admitting: Pulmonary Disease

## 2017-06-12 DIAGNOSIS — J849 Interstitial pulmonary disease, unspecified: Secondary | ICD-10-CM

## 2017-06-12 LAB — PULMONARY FUNCTION TEST
DL/VA % pred: 72 %
DL/VA: 3.24 ml/min/mmHg/L
DLCO cor % pred: 56 %
DLCO cor: 16.81 ml/min/mmHg
DLCO unc % pred: 54 %
DLCO unc: 16.26 ml/min/mmHg
FEF 25-75 PRE: 2.44 L/s
FEF2575-%Pred-Pre: 95 %
FEV1-%PRED-PRE: 83 %
FEV1-PRE: 2.68 L
FEV1FVC-%Pred-Pre: 105 %
FEV6-%Pred-Pre: 83 %
FEV6-PRE: 3.39 L
FEV6FVC-%PRED-PRE: 105 %
FVC-%Pred-Pre: 78 %
FVC-PRE: 3.39 L
PRE FEV1/FVC RATIO: 79 %
Pre FEV6/FVC Ratio: 100 %

## 2017-06-12 NOTE — Progress Notes (Signed)
Patient completed pre Arlyce Harman and DLCO today in PFT.

## 2017-06-26 ENCOUNTER — Telehealth: Payer: Self-pay | Admitting: Pulmonary Disease

## 2017-06-26 ENCOUNTER — Other Ambulatory Visit: Payer: Self-pay | Admitting: Pulmonary Disease

## 2017-06-26 DIAGNOSIS — J849 Interstitial pulmonary disease, unspecified: Secondary | ICD-10-CM

## 2017-06-26 NOTE — Telephone Encounter (Signed)
Returned Patient call.  Results and recommendations given.  Patient stated understanding.  Rheumatology referral ordered.  Nothing further needed at this time.    Per Dr Vaughan Browner-    Please let patient know that labs show mild allergies to cat and dog. In addition there is elevation in a lab called ANA that may indicate a rheumatologic disease  Refer to rheumatology for evaluation.   I have also reviewed the PFTs. They show slight worsening since 2017.

## 2017-06-28 ENCOUNTER — Telehealth: Payer: Self-pay | Admitting: Pulmonary Disease

## 2017-06-28 NOTE — Telephone Encounter (Signed)
Faxed records to [facility] as requested Joellen Jersey

## 2017-07-22 ENCOUNTER — Other Ambulatory Visit (HOSPITAL_COMMUNITY): Payer: Self-pay | Admitting: Physician Assistant

## 2017-07-22 ENCOUNTER — Ambulatory Visit (HOSPITAL_COMMUNITY)
Admission: RE | Admit: 2017-07-22 | Discharge: 2017-07-22 | Disposition: A | Discharge status: Home / Self Care | Payer: Medicaid Other | Source: Ambulatory Visit | Attending: Physician Assistant | Admitting: Physician Assistant

## 2017-07-22 DIAGNOSIS — M25661 Stiffness of right knee, not elsewhere classified: Secondary | ICD-10-CM | POA: Diagnosis present

## 2017-07-22 DIAGNOSIS — M1711 Unilateral primary osteoarthritis, right knee: Secondary | ICD-10-CM | POA: Insufficient documentation

## 2017-07-22 DIAGNOSIS — M25461 Effusion, right knee: Secondary | ICD-10-CM | POA: Insufficient documentation

## 2017-09-03 ENCOUNTER — Ambulatory Visit (INDEPENDENT_AMBULATORY_CARE_PROVIDER_SITE_OTHER): Payer: Medicaid Other | Admitting: *Deleted

## 2017-09-03 ENCOUNTER — Encounter: Payer: Self-pay | Admitting: Pulmonary Disease

## 2017-09-03 ENCOUNTER — Ambulatory Visit: Payer: Medicaid Other | Admitting: Pulmonary Disease

## 2017-09-03 VITALS — BP 136/80 | HR 55 | Ht 69.0 in | Wt 172.2 lb

## 2017-09-03 DIAGNOSIS — R911 Solitary pulmonary nodule: Secondary | ICD-10-CM | POA: Diagnosis not present

## 2017-09-03 DIAGNOSIS — J849 Interstitial pulmonary disease, unspecified: Secondary | ICD-10-CM | POA: Diagnosis not present

## 2017-09-03 DIAGNOSIS — J439 Emphysema, unspecified: Secondary | ICD-10-CM | POA: Diagnosis not present

## 2017-09-03 DIAGNOSIS — F17200 Nicotine dependence, unspecified, uncomplicated: Secondary | ICD-10-CM | POA: Diagnosis not present

## 2017-09-03 NOTE — Progress Notes (Addendum)
[name]    [id number]    [birth year]  Primary Care Physician:Mann, Carlean Jews, PA-C  Referring Physician: Cory Munch, PA-C [place], [place] [address]  Chief complaint:   Follow-up for emphysema, active smoker, ILD  HPI: 67 year old with history of emphysema, hemoptysis, GERD, active smoker hypertension, coronary artery disease, AAA.  He was previously followed by Dr. Ashok Cordia.  History noted for pulmonary emphysema with no obstruction on PFTs.  History of hemoptysis several years ago which has resolved, no parenchymal abnormalities on chest imaging.  He also has history of severe GERD status post esophageal dilatation in 2016 and 2018.  He continues on antiacid medication. Restarted on Symbicort at last appointment in the pulmonary clinic.  However he is using it only intermittently since he does not want to be dependent on inhaler therapy Complains of chronic congestion with white/greenish mucus, chronic dyspnea on exertion.  Denies any wheezing, fevers, chills, hemoptysis. Also has sinus congestion, postnasal drip.  He was hospitalized in January 2019 for atypical chest pain.  Underwent left heart cath on 02/04/17 which was negative for any abnormality of his coronary arteries.  Pets: Has a dog, cat, chicken, [place], pheasent, peacock in his yard.  He has a parakeet inside his house. Occupation: Used to work Biomedical engineer.  Currently retired Exposures: Exposure to fumes and dust in his line of work.  No mold, hot tub at home Smoking history: 50-pack-year smoking history.  Continues to smoke half pack per day Travel History: Used to travel while working.  No significant travel recently.  Interim history: He is stable symptoms.  Continues on Symbicort which he is using intermittently Continues to smoke.  At last visit we advised him to get rid of the [facility].  He has not done so since he is attached to the pet however he is  moved it to a different room and is trying to limit his exposure to it.  Outpatient Encounter Medications as of 09/03/2017  Medication Sig  . albuterol (PROAIR HFA) 108 (90 Base) MCG/ACT inhaler Inhale 2 puffs every 6 (six) hours as needed into the lungs. (Patient taking differently: Inhale 2 puffs into the lungs every 6 (six) hours as needed for wheezing or shortness of breath. )  . aspirin EC 81 MG tablet Take 81 mg by mouth daily.  . budesonide-formoterol (SYMBICORT) 160-4.5 MCG/ACT inhaler Inhale 2 puffs into the lungs 2 (two) times daily.  Marland Kitchen dextromethorphan-guaiFENesin (MUCINEX DM) 30-600 MG 12hr tablet Take 1 tablet by mouth 2 (two) times daily.  Marland Kitchen esomeprazole (NEXIUM) 40 MG capsule TAKE 1 CAPSULE (40 MG TOTAL) BY MOUTH 2 (TWO) TIMES DAILY BEFORE A MEAL. (Patient taking differently: Take 40 mg by mouth 2 (two) times daily before a meal. )  . fluticasone (FLONASE) 50 MCG/ACT nasal spray Place 2 sprays into both nostrils daily.  Marland Kitchen losartan-hydrochlorothiazide (HYZAAR) 100-12.5 MG tablet Take one-half tablet daily by mouth (Patient taking differently: Take 0.25 tablets by mouth daily as needed (SBP >130 or headache from high blood pressure). )  . Oxycodone HCl 20 MG TABS Take 20 mg by mouth every 4 (four) hours.   . Pediatric Multivit-Minerals-C (KIDS GUMMY BEAR VITAMINS) CHEW Chew 1-2 tablets by mouth every other day.  . sodium chloride (OCEAN) 0.65 % SOLN nasal spray Place 1 spray into both nostrils 3 (three) times daily as needed for congestion.   Marland Kitchen Spacer/Aero-Holding Chambers (AEROCHAMBER MV) inhaler Use as  instructed (Patient taking differently: See admin instructions. Use as instructed with Symbicort)  . [DISCONTINUED] cyclobenzaprine (FLEXERIL) 10 MG tablet Take 1 tablet (10 mg total) by mouth 3 (three) times daily as needed for muscle spasms.   No facility-administered encounter medications on file as of 09/03/2017.     Allergies as of 09/03/2017 - Review Complete 09/03/2017    Allergen Reaction Noted  . Benazepril hcl Shortness Of Breath and Other (See Comments) 06/01/2010  . Pantoprazole sodium Shortness Of Breath and Other (See Comments) 06/01/2010  . Amlodipine Swelling 01/18/2015    Past Medical History:  Diagnosis Date  . AAA (abdominal aortic aneurysm) ([place])    a. s/p EVAR 05/2010.  . Back pain   . CKD (chronic kidney disease), stage II   . Degenerative disk disease    a. Chronic back pain with ridiculopathy.  . Diverticulosis   . Emphysema of lung ([place])   . Essential hypertension   . GERD (gastroesophageal reflux disease)   . Hiatal hernia   . Hyperlipidemia   . 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 Pulmonary nodule    a. 60mm nodule seen on CT 09/2014 - f/u recommended 1 yr.  . Skin cancer   . Tobacco abuse     Past Surgical History:  Procedure Laterality Date  . ABDOMINAL AORTIC ENDOVASCULAR STENT GRAFT  2014  . APPENDECTOMY    . BACK SURGERY    . BIOPSY N/A 06/10/2013   Procedure: BIOPSY;  Surgeon: Rogene Houston, MD;  Location: [facility];  Service: Endoscopy;  Laterality: N/A;  . BIOPSY  12/03/2014   Procedure: BIOPSY (Gastroesophageal Junction);  Surgeon: Rogene Houston, MD;  Location: [facility];  Service: Endoscopy;;  . ESOPHAGEAL DILATION N/A 12/03/2014   Procedure: ESOPHAGEAL DILATION WITH 54FR AND 56FR MALONEY;  Surgeon: Rogene Houston, MD;  Location: [facility];  Service: Endoscopy;  Laterality: N/A;  . ESOPHAGOGASTRODUODENOSCOPY (EGD) WITH PROPOFOL N/A 06/10/2013   Procedure: ESOPHAGOGASTRODUODENOSCOPY (EGD) WITH PROPOFOL;  Surgeon: Rogene Houston, MD;  Location: [facility];  Service: Endoscopy;  Laterality: N/A;  . ESOPHAGOGASTRODUODENOSCOPY (EGD) WITH PROPOFOL N/A 12/03/2014   Procedure: ESOPHAGOGASTRODUODENOSCOPY (EGD) WITH PROPOFOL;  Surgeon: Rogene Houston, MD;  Location: [facility];  Service: Endoscopy;  Laterality: N/A;  . EVAR  06/20/10   for AAA repair   . LEFT HEART CATH AND CORONARY ANGIOGRAPHY N/A  02/04/2017   Procedure: LEFT HEART CATH AND CORONARY ANGIOGRAPHY;  Surgeon: Lorretta Harp, MD;  Location: [facility];  Service: Cardiovascular;  Laterality: N/A;  . MALONEY DILATION N/A 06/10/2013   Procedure: MALONEY DILATION 52 fr, 56 fr;  Surgeon: Rogene Houston, MD;  Location: [facility];  Service: Endoscopy;  Laterality: N/A;  . SPINAL FUSION     of neck and lower back    Family History  Problem Relation Age of Onset  . Atrial fibrillation Mother   . Heart disease Mother        Patient unclear of what kind  . Deep vein thrombosis Mother   . Hyperlipidemia Mother   . Hypertension Mother   . Stroke Father   . Diabetes Father   . Heart disease Father        Patient unclear of what kind  . Kidney disease Father   . Hyperlipidemia Father   . Hypertension Father   . Diabetes Sister   . Heart disease Sister        Patient unclear  of what kind  . Brain cancer Sister   . Seizures Sister   . Deep vein thrombosis Other   . Pulmonary embolism Other   . Cancer Paternal Grandmother   . Lung disease Neg Hx   . Colon cancer Neg Hx   . Esophageal cancer Neg Hx   . Rectal cancer Neg Hx   . Stomach cancer Neg Hx     Social History   Socioeconomic History  . Marital status: Married    Spouse name: Not on file  . Number of children: Not on file  . Years of education: Not on file  . Highest education level: Not on file  Occupational History  . Not on file  Social Needs  . Financial resource strain: Not on file  . Food insecurity:    Worry: Not on file    Inability: Not on file  . Transportation needs:    Medical: Not on file    Non-medical: Not on file  Tobacco Use  . Smoking status: Current Every Day Smoker    Packs/day: 1.00    Years: 48.00    Pack years: 48.00    Types: Cigarettes    Start date: 12/16/1965  . Smokeless tobacco: Never Used  Substance and Sexual Activity  . Alcohol use: No    Alcohol/week: 0.0 standard drinks  . Drug use: No  . Sexual  activity: Not on file  Lifestyle  . Physical activity:    Days per week: Not on file    Minutes per session: Not on file  . Stress: Not on file  Relationships  . Social connections:    Talks on phone: Not on file    Gets together: Not on file    Attends religious service: Not on file    Active member of club or organization: Not on file    Attends meetings of clubs or organizations: Not on file    Relationship status: Not on file  . Intimate partner violence:    Fear of current or ex partner: Not on file    Emotionally abused: Not on file    Physically abused: Not on file    Forced sexual activity: Not on file  Other Topics Concern  . Not on file  Social History Narrative   Originally from [place], [place]. Always lived in [place]. Travel to the [place] in March 2016. Has traveled to [place], [place], [place], & [place]. 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 No known asbestos exposure. Has 2 parakeets currently. Remotely raised parakeets, love birds, & cockatiels in a different house. No known mold or hot tub exposure.    Review of systems: Review of Systems  Constitutional: Negative for fever and chills.  HENT: Negative.   Eyes: Negative for blurred vision.  Respiratory: as per HPI  Cardiovascular: Negative for chest pain and palpitations.  Gastrointestinal: Negative for vomiting, diarrhea, blood per rectum. Genitourinary: Negative for dysuria, urgency, frequency and hematuria.  Musculoskeletal: Negative for myalgias, back pain and joint pain.  Skin: Negative for itching and rash.  Neurological: Negative for dizziness, tremors, focal weakness, seizures and loss of consciousness.  Endo/Heme/Allergies: Negative for environmental allergies.  Psychiatric/Behavioral: Negative for depression, suicidal ideas and hallucinations.  All other systems reviewed and are negative.  Physical Exam: Blood pressure 136/74,  pulse 77, height 5\' 10"  (1.778 m), weight 174 lb (78.9 kg), SpO2 98 %. Gen:  No acute distress HEENT:  EOMI, sclera anicteric Neck:     No masses; no thyromegaly Lungs:    Clear to auscultation bilaterally; normal respiratory effort CV:         Regular rate and rhythm; no murmurs Abd:      + bowel sounds; soft, non-tender; no palpable masses, no distension Ext:    No edema; adequate peripheral perfusion Skin:      Warm and dry; no rash Neuro: alert and oriented x 3 Psych: normal mood and affect  Data Reviewed: PFTs  12/31/14-FVC 3.44 [98%], FEV1 2.82 [85%], F/F 82, TLC 80%, DLCO 61% Moderate reduction in diffusion capacity.  No obstruction or restriction.  05/17/15-FVC 4.36 [82%], FEV1 3.28 [83%], F/F 75, DLCO 55% Normal spirometry.  Moderate reduction in diffusion capacity  06/12/17- FVC 4.29 [78%], FEV1 2.20 [83%), F/F 75, DLCO 54% Moderate diffusion defect  6MWT 12/31/14- 326 meters / Baseline Sat 96% on RA / Nadir Sat 96% on RA 09/20/17-336 m, baseline sat 98%, nadir sat 98% on room air  Imaging: CT abdomen pelvis 07/20/10-lung images show subtle basilar atelectasis CT scan 10/13/14-dependent bibasilar atelectasis, left upper lobe 3 mm nodule CT scan 03/31/15- atelectasis, stable left upper lobe 3 mm nodule CT scan 10/18/15- atelectasis, stable left upper lobe 3 mm nodule. Chest x-ray 02/03/17-suspected mild interstitial lung disease in the lower lobes.  High res CT chest 06/04/17-bibasilar fibrosis with mild bronchiectasis.  No honeycombing.  Patchy tree-in-bud opacities in the right upper lobe.  Left main and coronary atheosclerosis I have reviewed the images personally.  Labs 10/29/14 ALPHA-1 AT: MM (145)  Connective tissue serologies 06/05/2017-positive ANA, 1:320 titer, nucleolar pattern Hypersensitivity panel, CCP, Sjogren's panel, ANCA 06/05/2017-negative  Sputum culture 03/02/2017- AFB negative, fungus culture-light growth of yeast Normal oropharyngeal  flora.  Assessment:  Evaluation for interstitial lung disease CT scan reviewed lower lobe reticulation, scarring with no clear evidence of honeycombing This could be from hypersensitivity pneumonitis although the pattern of lower lobe predominance is unusual Other possibilities include chronic aspiration.  He does have a parakeet at home which he is reluctant to get rid of, however he is avoiding significant exposure by moving into a different room. Connective tissue disease serology reviewed with positive ANA. He has been referred to rheumatology but declined due to his Medicaid status.  He will need a referral from his primary care to be able to see a rheumatologist.  I have recommended that he get surgical lung biopsy versus transbronchial lung biopsy with the new Envisia test to eval for IPF.  He wants to discuss with his wife first.  Emphysema PFTs do not show any obstruction.  Continues on Symbicort which he is using intermittently Continues to smoke.  Encouraged smoking cessation.  Tree-in-bud opacities in the right upper lobe Likely nonspecific.  Sputum for AFB is negative.  Subcentimeter pulmonary nodule Has remained stable on follow-up imaging from 2016 to 2017.  This likely benign.   Follow-up on repeat imaging  Acid reflux, Barrett's esophagus Status post dilation by GI.  Continues on Nexium.    Chronic sinusitis, postnasal drip Recommended over-the-counter chlorphentermine and Flonase nasal spray.  Health maintenance Declined vaccination  Plan/Recommendations: - Continue Symbicort, smoking cessation - Rheumatology referral by primary care - Consider lung biopsy.  Marshell Garfinkel MD [facility] 09/03/2017, 3:43 PM  CC: Cory Munch, PA-C

## 2017-09-03 NOTE — Patient Instructions (Addendum)
Continue using inhalers as prescribed Please work on smoking cessation Call your primary care and have them refer you to rheumatology for elevated ANA I recommend that he get a surgical lung biopsy for better evaluation of the scarring in your lung Please call us after discussed with your wife if you want [place] to proceed with a referral  Follow-up in 3 months.

## 2017-09-03 NOTE — Progress Notes (Signed)
SIX MIN WALK 09/03/2017 12/31/2014  Medications aspirin 81mg  taken at 730am, nexium 40mg  taken at 730mg , oxycodone 20mg  taken at 12pm Albuterol HFA @ 7am -- ASA81, Nexium 40mg , Oxycodone 20mg  @ 8am -- Losartan 100/12.5mg  @ 830a  Supplimental Oxygen during Test? (L/min) No No  Laps 7 6  Partial Lap (in Meters) 0 38  Baseline BP (sitting) 120/70 132/70  Baseline Heartrate 61 68  Baseline Dyspnea (Borg Scale) 0.5 0  Baseline Fatigue (Borg Scale) 0 2  Baseline SPO2 98 96  BP (sitting) 130/80 110/60  Heartrate 67 97  Dyspnea (Borg Scale) 0.5 0  Fatigue (Borg Scale) 0 2  SPO2 97 96  BP (sitting) 118/70 118/68  Heartrate 59 65  SPO2 98 96  Stopped or Paused before Six Minutes No No  Interpretation Hip pain;Leg pain Hip pain;Leg pain  Distance Completed 336 326  Tech Comments: Pt walked at a normal pace denying any complaints during the walk. Pt walked carrying his cell phone and a Dr. Malachi Bonds and was also sipping on Dr. Malachi Bonds and talking on the cell [place] while walking. Pt completed 6MW with no complaints - walked at a slow-moderate pace. C/o of some hip/leg pain.

## 2017-09-20 ENCOUNTER — Other Ambulatory Visit: Payer: Self-pay | Admitting: Pulmonary Disease

## 2017-09-25 ENCOUNTER — Other Ambulatory Visit: Payer: Self-pay

## 2017-09-25 ENCOUNTER — Encounter (HOSPITAL_COMMUNITY): Payer: Self-pay

## 2017-09-25 ENCOUNTER — Emergency Department (HOSPITAL_COMMUNITY)
Admission: EM | Admit: 2017-09-25 | Discharge: 2017-09-25 | Disposition: A | Discharge status: Home / Self Care | Payer: Medicaid Other | Attending: Emergency Medicine | Admitting: Emergency Medicine

## 2017-09-25 DIAGNOSIS — Z7982 Long term (current) use of aspirin: Secondary | ICD-10-CM | POA: Diagnosis not present

## 2017-09-25 DIAGNOSIS — Z79899 Other long term (current) drug therapy: Secondary | ICD-10-CM | POA: Insufficient documentation

## 2017-09-25 DIAGNOSIS — F17210 Nicotine dependence, cigarettes, uncomplicated: Secondary | ICD-10-CM | POA: Diagnosis not present

## 2017-09-25 DIAGNOSIS — H8111 Benign paroxysmal vertigo, right ear: Secondary | ICD-10-CM | POA: Diagnosis not present

## 2017-09-25 DIAGNOSIS — N182 Chronic kidney disease, stage 2 (mild): Secondary | ICD-10-CM | POA: Diagnosis not present

## 2017-09-25 DIAGNOSIS — R42 Dizziness and giddiness: Secondary | ICD-10-CM | POA: Diagnosis present

## 2017-09-25 DIAGNOSIS — I129 Hypertensive chronic kidney disease with stage 1 through stage 4 chronic kidney disease, or unspecified chronic kidney disease: Secondary | ICD-10-CM | POA: Insufficient documentation

## 2017-09-25 MED ORDER — MECLIZINE HCL 25 MG PO TABS
25.0000 mg | ORAL_TABLET | Freq: Once | ORAL | Status: DC
  Filled 2017-09-25: qty 1

## 2017-09-25 MED ORDER — PROCHLORPERAZINE EDISYLATE 10 MG/2ML IJ SOLN
10.0000 mg | Freq: Once | INTRAMUSCULAR | Status: DC
  Filled 2017-09-25: qty 2

## 2017-09-25 MED ORDER — DIPHENHYDRAMINE HCL 50 MG/ML IJ SOLN
25.0000 mg | Freq: Once | INTRAMUSCULAR | Status: DC
  Filled 2017-09-25: qty 1

## 2017-09-25 MED ORDER — MECLIZINE HCL 25 MG PO TABS: 25.0000 mg | ORAL_TABLET | Freq: Three times a day (TID) | ORAL | 0 refills | Status: DC | PRN

## 2017-09-25 NOTE — ED Provider Notes (Addendum)
[facility] EMERGENCY DEPARTMENT Provider Note   CSN: [id number] Arrival date & time: 09/25/17  1837     History   Chief Complaint No chief complaint on file.   HPI [name] is a 67 y.o. male.  67 yo M with a cc dizziness.  Was working on his truck went underneath and felt like the room was spinning.  Felt a little light headache when he tried to get up.  Has resolved.  Lying still makes it better, head movement makes it worse.  Denies headache chest pain, n/v/ abdominal pain.    The history is provided by the patient.  Illness  This is a new problem. The current episode started 3 to 5 hours ago. The problem occurs constantly. The problem has not changed since onset.Pertinent negatives include no chest pain, no abdominal pain, no headaches and no shortness of breath. Nothing aggravates the symptoms. Nothing relieves the symptoms. He has tried nothing for the symptoms. The treatment provided no relief.    Past Medical History:  Diagnosis Date  . AAA (abdominal aortic aneurysm) ([place])    a. s/p EVAR 05/2010.  . Back pain   . CKD (chronic kidney disease), stage II   . Degenerative disk disease    a. Chronic back pain with ridiculopathy.  . Diverticulosis   . Emphysema of lung ([place])   . Essential hypertension   . GERD (gastroesophageal reflux disease)   . Hiatal hernia   . Hyperlipidemia   . 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 Pulmonary nodule    a. 78mm nodule seen on CT 09/2014 - f/u recommended 1 yr.  . Skin cancer   . Tobacco abuse     Patient Active Problem List   Diagnosis Date Noted  . Atypical chest pain 02/03/2017  . Acute URI 12/05/2016  . Cough 11/09/2015  . Asthmatic bronchitis with acute exacerbation 08/03/2015  . Tobacco use disorder 05/17/2015  . Emphysema of lung ([place]) 10/29/2014  . CKD (chronic kidney disease), stage II   . Pulmonary nodule   . Dysphagia 05/07/2013  . GERD (gastroesophageal reflux  disease) 05/07/2013  . Chest pain 09/06/2012  . Abdominal aneurysm without mention of rupture 10/20/2010  . Preop cardiovascular exam 06/01/2010  . Cigarette smoker 06/01/2010  . AAA (abdominal aortic aneurysm) ([place]) 06/01/2010  . HTN (hypertension) 06/01/2010    Past Surgical History:  Procedure Laterality Date  . ABDOMINAL AORTIC ENDOVASCULAR STENT GRAFT  2014  . APPENDECTOMY    . BACK SURGERY    . BIOPSY N/A 06/10/2013   Procedure: BIOPSY;  Surgeon: Rogene Houston, MD;  Location: [facility];  Service: Endoscopy;  Laterality: N/A;  . BIOPSY  12/03/2014   Procedure: BIOPSY (Gastroesophageal Junction);  Surgeon: Rogene Houston, MD;  Location: [facility];  Service: Endoscopy;;  . ESOPHAGEAL DILATION N/A 12/03/2014   Procedure: ESOPHAGEAL DILATION WITH 54FR AND 56FR MALONEY;  Surgeon: Rogene Houston, MD;  Location: [facility];  Service: Endoscopy;  Laterality: N/A;  . ESOPHAGOGASTRODUODENOSCOPY (EGD) WITH PROPOFOL N/A 06/10/2013   Procedure: ESOPHAGOGASTRODUODENOSCOPY (EGD) WITH PROPOFOL;  Surgeon: Rogene Houston, MD;  Location: [facility];  Service: Endoscopy;  Laterality: N/A;  . ESOPHAGOGASTRODUODENOSCOPY (EGD) WITH PROPOFOL N/A 12/03/2014   Procedure: ESOPHAGOGASTRODUODENOSCOPY (EGD) WITH PROPOFOL;  Surgeon: Rogene Houston, MD;  Location: [facility];  Service: Endoscopy;  Laterality: N/A;  . EVAR  06/20/10   for AAA repair   . LEFT HEART  CATH AND CORONARY ANGIOGRAPHY N/A 02/04/2017   Procedure: LEFT HEART CATH AND CORONARY ANGIOGRAPHY;  Surgeon: Lorretta Harp, MD;  Location: [facility];  Service: Cardiovascular;  Laterality: N/A;  . MALONEY DILATION N/A 06/10/2013   Procedure: MALONEY DILATION 52 fr, 88 fr;  Surgeon: Rogene Houston, MD;  Location: [facility];  Service: Endoscopy;  Laterality: N/A;  . SPINAL FUSION     of neck and lower back        Home Medications    Prior to Admission medications   Medication Sig Start Date End Date Taking? Authorizing Provider  albuterol (PROAIR  HFA) 108 (90 Base) MCG/ACT inhaler Inhale 2 puffs every 6 (six) hours as needed into the lungs. Patient taking differently: Inhale 2 puffs into the lungs every 6 (six) hours as needed for wheezing or shortness of breath.  12/05/16   Javier Glazier, MD  aspirin EC 81 MG tablet Take 81 mg by mouth daily.    [provider]  budesonide-formoterol (SYMBICORT) 160-4.5 MCG/ACT inhaler Inhale 2 puffs into the lungs 2 (two) times daily. 06/04/16   Javier Glazier, MD  dextromethorphan-guaiFENesin [facility] DM) 30-600 MG 12hr tablet Take 1 tablet by mouth 2 (two) times daily.    [provider]  esomeprazole (NEXIUM) 40 MG capsule TAKE 1 CAPSULE (40 MG TOTAL) BY MOUTH 2 (TWO) TIMES DAILY BEFORE A MEAL. Patient taking differently: Take 40 mg by mouth 2 (two) times daily before a meal.  01/18/17   Rehman, Mechele Dawley, MD  fluticasone (FLONASE) 50 MCG/ACT nasal spray SPRAY TWICE INTO EACH NOSTRIL EVERY DAY 09/20/17   [name], Praveen, MD  losartan-hydrochlorothiazide (HYZAAR) 100-12.5 MG tablet Take one-half tablet daily by mouth Patient taking differently: Take 0.25 tablets by mouth daily as needed (SBP >130 or headache from high blood pressure).  12/30/14   Sherren Mocha, MD  meclizine (ANTIVERT) 25 MG tablet Take 1 tablet (25 mg total) by mouth 3 (three) times daily as needed for dizziness. 09/25/17   Deno Etienne, DO  Oxycodone HCl 20 MG TABS Take 20 mg by mouth every 4 (four) hours.     [provider]  Pediatric Multivit-Minerals-C (KIDS GUMMY BEAR VITAMINS) CHEW Chew 1-2 tablets by mouth every other day.    [provider]  sodium chloride (OCEAN) 0.65 % SOLN nasal spray Place 1 spray into both nostrils 3 (three) times daily as needed for congestion.     [provider]  Spacer/Aero-Holding Chambers (AEROCHAMBER MV) inhaler Use as instructed Patient taking differently: See admin instructions. Use as instructed with Symbicort 11/09/15   Javier Glazier, MD     Family History Family History  Problem Relation Age of Onset  . Atrial fibrillation Mother   . Heart disease Mother        Patient unclear of what kind  . Deep vein thrombosis Mother   . Hyperlipidemia Mother   . Hypertension Mother   . Stroke Father   . Diabetes Father   . Heart disease Father        Patient unclear of what kind  . Kidney disease Father   . Hyperlipidemia Father   . Hypertension Father   . Diabetes Sister   . Heart disease Sister        Patient unclear of what kind  . Brain cancer Sister   . Seizures Sister   . Deep vein thrombosis Other   . Pulmonary embolism Other   . Cancer Paternal Grandmother   .  Lung disease Neg Hx   . Colon cancer Neg Hx   . Esophageal cancer Neg Hx   . Rectal cancer Neg Hx   . Stomach cancer Neg Hx     Social History Social History   Tobacco Use  . Smoking status: Current Every Day Smoker    Packs/day: 1.00    Years: 48.00    Pack years: 48.00    Types: Cigarettes    Start date: 12/16/1965  . Smokeless tobacco: Never Used  Substance Use Topics  . Alcohol use: No    Alcohol/week: 0.0 standard drinks  . Drug use: No     Allergies   Benazepril hcl; Pantoprazole sodium; and Amlodipine   Review of Systems Review of Systems  Constitutional: Negative for chills and fever.  HENT: Negative for congestion and facial swelling.   Eyes: Negative for discharge and visual disturbance.  Respiratory: Negative for shortness of breath.   Cardiovascular: Negative for chest pain and palpitations.  Gastrointestinal: Negative for abdominal pain, diarrhea and vomiting.  Musculoskeletal: Negative for arthralgias and myalgias.  Skin: Negative for color change and rash.  Neurological: Positive for dizziness. Negative for tremors, syncope and headaches.  Psychiatric/Behavioral: Negative for confusion and dysphoric mood.     Physical Exam Updated Vital Signs BP 127/73 (BP Location: Right Arm)   Pulse 71   Temp 98.1 F (36.7  C) (Oral)   Resp 13   SpO2 97%   Physical Exam  Constitutional: He is oriented to person, place, and time. He appears well-developed and well-nourished.  HENT:  Head: Normocephalic and atraumatic.  Eyes: Pupils are equal, round, and reactive to light. EOM are normal.  Neck: Normal range of motion. Neck supple. No JVD present.  Cardiovascular: Normal rate and regular rhythm. Exam reveals no gallop and no friction rub.  No murmur heard. Pulmonary/Chest: No respiratory distress. He has no wheezes.  Abdominal: He exhibits no distension and no mass. There is no tenderness. There is no rebound and no guarding.  Musculoskeletal: Normal range of motion.  Neck fusion unable to turn head significantly in either direction.   Neurological: He is alert and oriented to person, place, and time. He has normal strength. No cranial nerve deficit or sensory deficit. He displays a negative Romberg sign. GCS eye subscore is 4. GCS verbal subscore is 5. GCS motor subscore is 6.  Stumbles to one side then the other.  Then corrected and ambulated without difficulty  Skin: No rash noted. No pallor.  Psychiatric: He has a normal mood and affect. His behavior is normal.  Nursing note and vitals reviewed.    ED Treatments / Results  Labs (all labs ordered are listed, but only abnormal results are displayed) Labs Reviewed - No data to display  EKG EKG Interpretation  Date/Time:  Wednesday September 25 2017 18:38:45 EDT Ventricular Rate:  71 PR Interval:    QRS Duration: 86 QT Interval:  375 QTC Calculation: 408 R Axis:   -21 Text Interpretation:  Sinus rhythm Borderline left axis deviation Borderline low voltage, extremity leads Probable anteroseptal infarct, old No significant change since last tracing Confirmed by Deno Etienne [phone]) on 09/25/2017 6:41:59 PM   Radiology No results found.  Procedures Procedures (including critical care time)  Medications Ordered in ED Medications   prochlorperazine (COMPAZINE) injection 10 mg (has no administration in time range)  diphenhydrAMINE (BENADRYL) injection 25 mg (has no administration in time range)  meclizine (ANTIVERT) tablet 25 mg (has no administration in time range)  Initial Impression / Assessment and Plan / ED Course  I have reviewed the triage vital signs and the nursing notes.  Pertinent labs & imaging results that were available during my care of the patient were reviewed by me and considered in my medical decision making (see chart for details).     67 yo M with a cc of dizziness.  Started earlier today while working on his car.  Started acutely when lying flat.  Feels much better now.    The patient was able to ambulate for me but did experience a balance of dizziness.  Benign neuro exam for me.  I offered the patient different therapies he is requesting to go home.  We will give a headache cocktail and meclizine and then have him take meclizine at home.  Given a handout for the Epley maneuver the I think that we will have limited utility with the patient having a C-spine fusion.  7:42 PM:  I have discussed the diagnosis/risks/treatment options with the patient and family and believe the pt to be eligible for discharge home to follow-up with PCP. We also discussed returning to the ED immediately if new or worsening sx occur. We discussed the sx which are most concerning (e.g., sudden worsening dizziness, inability to walk, difficulty with speech or swallowing, difficulty moving one side of his body.) that necessitate immediate return. Medications administered to the patient during their visit and any new prescriptions provided to the patient are listed below.  Medications given during this visit Medications  prochlorperazine (COMPAZINE) injection 10 mg (has no administration in time range)  diphenhydrAMINE (BENADRYL) injection 25 mg (has no administration in time range)  meclizine (ANTIVERT) tablet 25 mg (has  no administration in time range)      The patient appears reasonably screen and/or stabilized for discharge and I doubt any other medical condition or other [facility] requiring further screening, evaluation, or treatment in the ED at this time prior to discharge.    Final Clinical Impressions(s) / ED Diagnoses   Final diagnoses:  BPPV (benign paroxysmal positional vertigo), right    ED Discharge Orders         Ordered    meclizine (ANTIVERT) 25 MG tablet  3 times daily PRN     09/25/17 1940           Deno Etienne, DO 09/25/17 1942    Deno Etienne, DO 09/25/17 1943

## 2017-09-25 NOTE — Discharge Instructions (Signed)
Return for inability to walk, one sided weakness, difficulty with speech or swallowing

## 2017-09-25 NOTE — ED Triage Notes (Signed)
Per ems pt was working outside all day. Pt did not eat or drink much today. When pt stood up he had a near syncopal episode. Pt dizzy when opens eyes. No pain or discomfort noted. 200ML NS given en route

## 2017-10-02 ENCOUNTER — Other Ambulatory Visit (INDEPENDENT_AMBULATORY_CARE_PROVIDER_SITE_OTHER): Payer: Self-pay | Admitting: Internal Medicine

## 2017-10-02 NOTE — Telephone Encounter (Signed)
Patient will need to have a office visit prior to the nest refill. He has 6 months to get appointment or he may get this prescription from PCP.

## 2017-10-31 NOTE — Progress Notes (Signed)
Office Visit Note  Patient: [name]             Date of Birth: 1950-05-05           MRN: [id number]             PCP: Cory Munch, PA-C Referring: Ginger Organ Visit Date: 11/12/2017 Occupation: Retired ( ceramic tile in the past)  Subjective:  New Patient (Initial Visit) pain in multiple joints   History of Present Illness: [name] is a 67 y.o. male seen in consultation per request of his PCP.  According to patient he started having pain and discomfort in bilateral legs and his knee joints about 1 year ago.  He states the pain is gradually getting worse.  He also describes pain in his bilateral hands and his feet.  He has noticed some swelling in his right knee joint.  He had a cortisone injection in his right knee joint about 4 5 months ago and now the pain has recurred.  He sometimes have pain in his rib cage as well.  He had labs done recently by PCP showed positive ANA for the reason he was referred to me.  He denies any history of rash. On chart review I noted that patient had a CT scan of his chest with possibility of pneumonitis versus UIP.  He was evaluated by Dr. Vaughan Browner, who diagnosed him with COPD and possible hypersensitivity pneumonitis.  The suspicion of interstitial lung disease was low according to his note.  He was advised to get rid of [place] he has at home.  Patient states the [place] is in a different room but he did not get rid of the [place].  He continues to smoke.  He also has history of some stable pulmonary  nodules.  Activities of Daily Living:  Patient reports morning stiffness for 30 minutes.   Patient Denies nocturnal pain.  Difficulty dressing/grooming: Denies Difficulty climbing stairs: Reports Difficulty getting out of chair: Denies Difficulty using hands for taps, buttons, cutlery, and/or writing: Denies  Review of Systems  Constitutional: Negative for fatigue and night sweats.  HENT: Positive for ear ringing and mouth  dryness. Negative for mouth sores and nose dryness.   Eyes: Negative for redness and dryness.  Respiratory: Positive for cough. Negative for shortness of breath, apnea and difficulty breathing.   Cardiovascular: Positive for hypertension. Negative for chest pain, palpitations, irregular heartbeat and swelling in legs/feet.  Gastrointestinal: Negative for blood in stool, constipation and diarrhea.  Endocrine: Negative for increased urination.  Genitourinary: Negative for urgency.  Musculoskeletal: Positive for arthralgias, joint pain, joint swelling and morning stiffness. Negative for myalgias, muscle weakness, muscle tenderness and myalgias.  Skin: Negative for color change, rash, hair loss, nodules/bumps, skin tightness, ulcers and sensitivity to sunlight.  Allergic/Immunologic: Negative for susceptible to infections.  Neurological: Positive for dizziness. Negative for fainting, numbness, headaches, memory loss, night sweats and weakness.  Hematological: Negative for swollen glands.  Psychiatric/Behavioral: Negative for depressed mood and sleep disturbance. The patient is not nervous/anxious.     PMFS History:  Patient Active Problem List   Diagnosis Date Noted  . Atypical chest pain 02/03/2017  . Acute URI 12/05/2016  . Cough 11/09/2015  . Asthmatic bronchitis with acute exacerbation 08/03/2015  . Tobacco use disorder 05/17/2015  . Emphysema of lung ([place]) 10/29/2014  . CKD (chronic kidney disease), stage II   . Pulmonary nodule   . Dysphagia 05/07/2013  . GERD (gastroesophageal reflux  disease) 05/07/2013  . Chest pain 09/06/2012  . Abdominal aneurysm without mention of rupture 10/20/2010  . Preop cardiovascular exam 06/01/2010  . Cigarette smoker 06/01/2010  . AAA (abdominal aortic aneurysm) ([place]) 06/01/2010  . HTN (hypertension) 06/01/2010    Past Medical History:  Diagnosis Date  . AAA (abdominal aortic aneurysm) ([place])    a. s/p EVAR 05/2010.  . Back pain   . CKD (chronic  kidney disease), stage II   . Degenerative disk disease    a. Chronic back pain with ridiculopathy.  . Diverticulosis   . Emphysema of lung ([place])   . Essential hypertension   . GERD (gastroesophageal reflux disease)   . Hiatal hernia   . Hyperlipidemia   . 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 Pulmonary nodule    a. 32mm nodule seen on CT 09/2014 - f/u recommended 1 yr.  . Skin cancer   . Tobacco abuse     Family History  Problem Relation Age of Onset  . Atrial fibrillation Mother   . Heart disease Mother        Patient unclear of what kind  . Deep vein thrombosis Mother   . Hyperlipidemia Mother   . Hypertension Mother   . Stroke Father   . Diabetes Father   . Heart disease Father        Patient unclear of what kind  . Kidney disease Father   . Hyperlipidemia Father   . Hypertension Father   . Diabetes Sister   . Heart disease Sister        Patient unclear of what kind  . Brain cancer Sister   . Seizures Sister   . Deep vein thrombosis Other   . Pulmonary embolism Other   . Cancer Paternal Grandmother   . Lung disease Neg Hx   . Colon cancer Neg Hx   . Esophageal cancer Neg Hx   . Rectal cancer Neg Hx   . Stomach cancer Neg Hx    Past Surgical History:  Procedure Laterality Date  . ABDOMINAL AORTIC ENDOVASCULAR STENT GRAFT  2014  . APPENDECTOMY    . BACK SURGERY    . BIOPSY N/A 06/10/2013   Procedure: BIOPSY;  Surgeon: Rogene Houston, MD;  Location: [facility];  Service: Endoscopy;  Laterality: N/A;  . BIOPSY  12/03/2014   Procedure: BIOPSY (Gastroesophageal Junction);  Surgeon: Rogene Houston, MD;  Location: [facility];  Service: Endoscopy;;  . ESOPHAGEAL DILATION N/A 12/03/2014   Procedure: ESOPHAGEAL DILATION WITH 54FR AND 56FR MALONEY;  Surgeon: Rogene Houston, MD;  Location: [facility];  Service: Endoscopy;  Laterality: N/A;  . ESOPHAGOGASTRODUODENOSCOPY (EGD) WITH PROPOFOL N/A 06/10/2013   Procedure: ESOPHAGOGASTRODUODENOSCOPY (EGD) WITH  PROPOFOL;  Surgeon: Rogene Houston, MD;  Location: [facility];  Service: Endoscopy;  Laterality: N/A;  . ESOPHAGOGASTRODUODENOSCOPY (EGD) WITH PROPOFOL N/A 12/03/2014   Procedure: ESOPHAGOGASTRODUODENOSCOPY (EGD) WITH PROPOFOL;  Surgeon: Rogene Houston, MD;  Location: [facility];  Service: Endoscopy;  Laterality: N/A;  . EVAR  06/20/10   for AAA repair   . LEFT HEART CATH AND CORONARY ANGIOGRAPHY N/A 02/04/2017   Procedure: LEFT HEART CATH AND CORONARY ANGIOGRAPHY;  Surgeon: Lorretta Harp, MD;  Location: [facility];  Service: Cardiovascular;  Laterality: N/A;  . MALONEY DILATION N/A 06/10/2013   Procedure: MALONEY DILATION 52 fr, 45 fr;  Surgeon: Rogene Houston, MD;  Location: [facility];  Service: Endoscopy;  Laterality: N/A;  .  SPINAL FUSION     of neck and lower back   Social History   Social History Narrative   Originally from [place], [place]. Always lived in [place]. Travel to the [place] in March 2016. Has traveled to [place], [place], [place], & [place]. 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 No known asbestos exposure. Has 2 parakeets currently. Remotely raised parakeets, love birds, & cockatiels in a different house. No known mold or hot tub exposure.     Objective: Vital Signs: BP (!) 143/73 (BP Location: Left Arm, Patient Position: Sitting, Cuff Size: Normal)   Pulse 62   Ht 5\' 9"  (1.753 m)   Wt 181 lb (82.1 kg)   BMI 26.73 kg/m    Physical Exam  Constitutional: He is oriented to person, place, and time. He appears well-developed and well-nourished.  HENT:  Head: Normocephalic and atraumatic.  Eyes: Pupils are equal, round, and reactive to light. Conjunctivae and EOM are normal.  Neck: Normal range of motion. Neck supple.  Cardiovascular: Normal rate, regular rhythm and normal heart sounds.  Pulmonary/Chest: Effort normal and breath sounds normal.  Abdominal: Soft. Bowel sounds are normal.    Neurological: He is alert and oriented to person, place, and time.  Skin: Skin is warm and dry. Capillary refill takes less than 2 seconds.  Psychiatric: He has a normal mood and affect. His behavior is normal.  Nursing note and vitals reviewed.    Musculoskeletal Exam: Patient has very limited range of motion of his cervical spine.  He had fairly good range of motion of lumbar spine without any discomfort.  Shoulder joints elbow joints wrist joints were in good range of motion.  He has DIP and PIP thickening in his bilateral hands without any synovitis.  Hip joints were in good range of motion.  He has some discomfort and crepitus with range of motion of his right knee joint. Without any warmth swelling or effusion.  He has some DIP and PIP thickening in his feet consistent with osteoarthritis.  CDAI Exam: CDAI Score: Not documented Patient Global Assessment: Not documented; Provider Global Assessment: Not documented Swollen: Not documented; Tender: Not documented Joint Exam   Not documented   There is currently no information documented on the homunculus. Go to the Rheumatology activity and complete the homunculus joint exam.  Investigation: No additional findings.  Imaging: Xr Hand 2 View Left  Result Date: 11/12/2017 Minimal first second and third MCP narrowing was noted.  PIP and DIP narrowing was noted.  No juxta articular osteopenia was noted.  No erosive changes were noted.  No intercarpal radiocarpal joint space narrowing was noted. Impression: These findings are consistent with osteoarthritis of the hand.  Xr Hand 2 View Right  Result Date: 11/12/2017 Minimal first second and third MCP narrowing was noted.  PIP and DIP narrowing was noted.  No juxta articular osteopenia was noted.  No erosive changes were noted.  No intercarpal radiocarpal joint space narrowing was noted. Impression: These findings are consistent with osteoarthritis of the hand.   Recent Labs: Lab  Results  Component Value Date   WBC 11.3 (H) 06/05/2017   HGB 13.5 06/05/2017   PLT 342.0 06/05/2017   NA 140 04/08/2017   K 4.1 04/08/2017   CL 105 04/08/2017   CO2 25 04/08/2017   GLUCOSE 107 (H) 04/08/2017   BUN 9 04/08/2017   CREATININE 1.23 04/08/2017   BILITOT 0.6  04/08/2017   ALKPHOS 51 04/08/2017   AST 20 04/08/2017   ALT 15 (L) 04/08/2017   PROT 7.5 04/08/2017   ALBUMIN 3.9 04/08/2017   CALCIUM 8.9 04/08/2017   GFRAA >60 04/08/2017  Jun 05, 2017 ANA 1: 320 nucleolar, SSA negative, SSB negative, ANCA negative, RF negative, anti-CCP negative  Speciality Comments: No specialty comments available.  Procedures:  No procedures performed Allergies: Benazepril hcl; Pantoprazole sodium; and Amlodipine   Assessment / Plan:     Visit Diagnoses: Positive ANA (antinuclear antibody) - 06/05/2017 nucleolar 1:320, -Ro, -La -.  Patient has no clinical features of autoimmune disease on examination.  He does have questionable findings on the high-resolution CT of the lungs.  I will obtain following labs today.  Plan: Urinalysis, Routine w reflex microscopic, Anti-scleroderma antibody, RNP Antibody, Anti-Smith antibody, Anti-DNA antibody, double-stranded, C3 and C4, Sedimentation rate  Pain in both hands -clinical findings and radiographic findings are consistent with osteoarthritis.  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  He had no synovitis on examination.  Plan: XR Hand 2 View Right, XR Hand 2 View Left  Primary osteoarthritis of right knee-I reviewed the x-ray of right knee joint consistent with moderate osteoarthritis and chondromalacia patella.  He had good response to cortisone injection in the past.  DDD (degenerative disc disease), cervical - s/p fusion.  He has very limited range of motion of the cervical spine.  Pulmonary nodule - Plan: Angiotensin converting enzyme  History of emphysema-his chronic smoker and still  continues to smoke.  Dr. Vaughan Browner is concerned that he may have hypersensitivity pneumonitis.  He still keeps [place] in his home although in different room.  Tobacco abuse-smoking cessation was discussed.  History of chronic kidney disease  History of gastroesophageal reflux (GERD)  History of hiatal hernia  History of diverticulosis  History of hypertension  History of hyperlipidemia  History of abdominal aortic aneurysm (AAA)  Other fatigue - Plan: CK, Serum protein electrophoresis with reflex   Orders: Orders Placed This Encounter  Procedures  . XR Hand 2 View Right  . XR Hand 2 View Left  . Urinalysis, Routine w reflex microscopic  . CK  . Anti-scleroderma antibody  . RNP Antibody  . Anti-Smith antibody  . Anti-DNA antibody, double-stranded  . C3 and C4  . Angiotensin converting enzyme  . Sedimentation rate  . Serum protein electrophoresis with reflex   No orders of the defined types were placed in this encounter.   Face-to-face time spent with patient was 50 minutes. Greater than 50% of time was spent in counseling and coordination of care.  Follow-Up Instructions: Return for Positive ANA.   Bo Merino, MD  Note - This record has been created using Editor, commissioning.  Chart creation errors have been sought, but may not always  have been located. Such creation errors do not reflect on  the standard of medical care.

## 2017-11-12 ENCOUNTER — Ambulatory Visit (INDEPENDENT_AMBULATORY_CARE_PROVIDER_SITE_OTHER): Payer: Medicaid Other | Admitting: Rheumatology

## 2017-11-12 ENCOUNTER — Ambulatory Visit (INDEPENDENT_AMBULATORY_CARE_PROVIDER_SITE_OTHER): Payer: Medicaid Other

## 2017-11-12 ENCOUNTER — Encounter: Payer: Self-pay | Admitting: Rheumatology

## 2017-11-12 ENCOUNTER — Ambulatory Visit (INDEPENDENT_AMBULATORY_CARE_PROVIDER_SITE_OTHER): Payer: Self-pay

## 2017-11-12 VITALS — BP 143/73 | HR 62 | Ht 69.0 in | Wt 181.0 lb

## 2017-11-12 DIAGNOSIS — Z8639 Personal history of other endocrine, nutritional and metabolic disease: Secondary | ICD-10-CM

## 2017-11-12 DIAGNOSIS — Z8719 Personal history of other diseases of the digestive system: Secondary | ICD-10-CM

## 2017-11-12 DIAGNOSIS — R5383 Other fatigue: Secondary | ICD-10-CM

## 2017-11-12 DIAGNOSIS — M79642 Pain in left hand: Secondary | ICD-10-CM | POA: Diagnosis not present

## 2017-11-12 DIAGNOSIS — M79641 Pain in right hand: Secondary | ICD-10-CM | POA: Diagnosis not present

## 2017-11-12 DIAGNOSIS — Z87448 Personal history of other diseases of urinary system: Secondary | ICD-10-CM

## 2017-11-12 DIAGNOSIS — M5030 Other cervical disc degeneration, unspecified cervical region: Secondary | ICD-10-CM

## 2017-11-12 DIAGNOSIS — Z8709 Personal history of other diseases of the respiratory system: Secondary | ICD-10-CM

## 2017-11-12 DIAGNOSIS — M1711 Unilateral primary osteoarthritis, right knee: Secondary | ICD-10-CM | POA: Diagnosis not present

## 2017-11-12 DIAGNOSIS — Z8679 Personal history of other diseases of the circulatory system: Secondary | ICD-10-CM

## 2017-11-12 DIAGNOSIS — R768 Other specified abnormal immunological findings in serum: Secondary | ICD-10-CM | POA: Diagnosis not present

## 2017-11-12 DIAGNOSIS — Z720 Tobacco use: Secondary | ICD-10-CM

## 2017-11-12 DIAGNOSIS — R911 Solitary pulmonary nodule: Secondary | ICD-10-CM

## 2017-11-14 LAB — URINALYSIS, ROUTINE W REFLEX MICROSCOPIC
Bilirubin Urine: NEGATIVE
Glucose, UA: NEGATIVE
HGB URINE DIPSTICK: NEGATIVE
Ketones, ur: NEGATIVE
Leukocytes, UA: NEGATIVE
NITRITE: NEGATIVE
PH: 5.5 (ref 5.0–8.0)
PROTEIN: NEGATIVE
Specific Gravity, Urine: 1.015 (ref 1.001–1.03)

## 2017-11-14 LAB — PROTEIN ELECTROPHORESIS, SERUM, WITH REFLEX
ALBUMIN ELP: 4 g/dL (ref 3.8–4.8)
ALPHA 1: 0.3 g/dL (ref 0.2–0.3)
ALPHA 2: 0.8 g/dL (ref 0.5–0.9)
BETA GLOBULIN: 0.5 g/dL (ref 0.4–0.6)
Beta 2: 0.4 g/dL (ref 0.2–0.5)
Gamma Globulin: 1.1 g/dL (ref 0.8–1.7)
TOTAL PROTEIN: 7.2 g/dL (ref 6.1–8.1)

## 2017-11-14 LAB — ANTI-SCLERODERMA ANTIBODY: SCLERODERMA (SCL-70) (ENA) ANTIBODY, IGG: NEGATIVE AI

## 2017-11-14 LAB — C3 AND C4
C3 Complement: 135 mg/dL (ref 82–185)
C4 COMPLEMENT: 18 mg/dL (ref 15–53)

## 2017-11-14 LAB — ANGIOTENSIN CONVERTING ENZYME: Angiotensin-Converting Enzyme: 49 U/L (ref 9–67)

## 2017-11-14 LAB — RNP ANTIBODY: RIBONUCLEIC PROTEIN(ENA) ANTIBODY, IGG: POSITIVE AI — AB

## 2017-11-14 LAB — CK: Total CK: 94 U/L (ref 44–196)

## 2017-11-14 LAB — SEDIMENTATION RATE: Sed Rate: 9 mm/h (ref 0–20)

## 2017-11-14 LAB — ANTI-DNA ANTIBODY, DOUBLE-STRANDED: ds DNA Ab: 1 IU/mL

## 2017-11-14 LAB — ANTI-SMITH ANTIBODY: ENA SM AB SER-ACNC: NEGATIVE AI

## 2017-11-14 NOTE — Progress Notes (Signed)
This is a mutual patient. 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  He has no other clinical features of autoimmune disease.  Please advise based on his high-resolution CT should he be on immunosuppressive agents like Imuran.

## 2017-11-15 ENCOUNTER — Other Ambulatory Visit: Payer: Self-pay | Admitting: Pulmonary Disease

## 2017-11-15 DIAGNOSIS — J849 Interstitial pulmonary disease, unspecified: Secondary | ICD-10-CM

## 2017-11-21 ENCOUNTER — Ambulatory Visit (INDEPENDENT_AMBULATORY_CARE_PROVIDER_SITE_OTHER)
Admission: RE | Admit: 2017-11-21 | Discharge: 2017-11-21 | Disposition: A | Discharge status: Home / Self Care | Payer: Medicaid Other | Source: Ambulatory Visit | Attending: Pulmonary Disease | Admitting: Pulmonary Disease

## 2017-11-21 DIAGNOSIS — J849 Interstitial pulmonary disease, unspecified: Secondary | ICD-10-CM | POA: Diagnosis not present

## 2017-11-22 DIAGNOSIS — M19042 Primary osteoarthritis, left hand: Secondary | ICD-10-CM

## 2017-11-22 DIAGNOSIS — M5030 Other cervical disc degeneration, unspecified cervical region: Secondary | ICD-10-CM | POA: Insufficient documentation

## 2017-11-22 DIAGNOSIS — M19041 Primary osteoarthritis, right hand: Secondary | ICD-10-CM | POA: Insufficient documentation

## 2017-11-22 DIAGNOSIS — M1711 Unilateral primary osteoarthritis, right knee: Secondary | ICD-10-CM | POA: Insufficient documentation

## 2017-11-22 NOTE — Progress Notes (Signed)
Office Visit Note  Patient: [name]             Date of Birth: 10-25-1950           MRN: [id number]             PCP: Ginger Organ Referring: Ginger Organ Visit Date: 12/03/2017 Occupation: _0 @  Subjective:  Pain in right knee.   History of Present Illness: [name] is a 67 y.o. male with history of positive ANA and positive RNP.  He continues to have some shortness of breath due to underlying idiopathic pulmonary fibrosis.  He also has osteoarthritis and degenerative disc disease.  He states he had right knee joint cortisone injection in the past which was helpful and now the right knee joint pain has recurred.  His neck has been fused which causes some discomfort.  I discussed his labs with Dr. Vaughan Browner after the last visit.  He is planning to do lung biopsy to evaluate this further.  Activities of Daily Living:  Patient reports morning stiffness for 5-10 minutes.   Patient Denies nocturnal pain.  Difficulty dressing/grooming: Denies Difficulty climbing stairs: Reports Difficulty getting out of chair: Denies Difficulty using hands for taps, buttons, cutlery, and/or writing: Denies  Review of Systems  Constitutional: Negative for fatigue.  HENT: Positive for ear ringing. Negative for mouth dryness and nose dryness.   Eyes: Negative for redness and dryness.  Respiratory: Positive for cough. Negative for difficulty breathing.   Cardiovascular: Positive for hypertension. Negative for chest pain, palpitations and swelling in legs/feet.  Gastrointestinal: Negative for blood in stool, constipation and diarrhea.  Genitourinary: Negative for difficulty urinating.  Musculoskeletal: Positive for myalgias, muscle weakness, morning stiffness and myalgias. Negative for arthralgias, joint pain and joint swelling.  Skin: Positive for hair loss. Negative for rash and sensitivity to sunlight.  Allergic/Immunologic: Positive for susceptible to infections.    Neurological: Positive for weakness. Negative for dizziness, headaches and memory loss.  Hematological: Negative for swollen glands.  Psychiatric/Behavioral: Negative for depressed mood and sleep disturbance. The patient is not nervous/anxious.     PMFS History:  Patient Active Problem List   Diagnosis Date Noted  . Primary osteoarthritis of both hands 11/22/2017  . Primary osteoarthritis of right knee 11/22/2017  . DDD (degenerative disc disease), cervical 11/22/2017  . Atypical chest pain 02/03/2017  . Acute URI 12/05/2016  . Cough 11/09/2015  . Asthmatic bronchitis with acute exacerbation 08/03/2015  . Tobacco use disorder 05/17/2015  . Emphysema of lung ([place]) 10/29/2014  . CKD (chronic kidney disease), stage II   . Pulmonary nodule   . Dysphagia 05/07/2013  . GERD (gastroesophageal reflux disease) 05/07/2013  . Chest pain 09/06/2012  . Abdominal aneurysm without mention of rupture 10/20/2010  . Preop cardiovascular exam 06/01/2010  . Cigarette smoker 06/01/2010  . AAA (abdominal aortic aneurysm) ([place]) 06/01/2010  . HTN (hypertension) 06/01/2010    Past Medical History:  Diagnosis Date  . AAA (abdominal aortic aneurysm) ([place])    a. s/p EVAR 05/2010.  . Back pain   . CKD (chronic kidney disease), stage II   . Degenerative disk disease    a. Chronic back pain with ridiculopathy.  . Diverticulosis   . Emphysema of lung ([place])   . Essential hypertension   . GERD (gastroesophageal reflux disease)   . Hiatal hernia   . Hyperlipidemia   . 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  Pulmonary nodule    a. 45m nodule seen on CT 09/2014 - f/u recommended 1 yr.  . Skin cancer   . Tobacco abuse     Family History  Problem Relation Age of Onset  . Atrial fibrillation Mother   . Heart disease Mother        Patient unclear of what kind  . Deep vein thrombosis Mother   . Hyperlipidemia Mother   . Hypertension Mother   . Stroke Father   . Diabetes  Father   . Heart disease Father        Patient unclear of what kind  . Kidney disease Father   . Hyperlipidemia Father   . Hypertension Father   . Diabetes Sister   . Heart disease Sister        Patient unclear of what kind  . Brain cancer Sister   . Seizures Sister   . Deep vein thrombosis Other   . Pulmonary embolism Other   . Cancer Paternal Grandmother   . Lung disease Neg Hx   . Colon cancer Neg Hx   . Esophageal cancer Neg Hx   . Rectal cancer Neg Hx   . Stomach cancer Neg Hx    Past Surgical History:  Procedure Laterality Date  . ABDOMINAL AORTIC ENDOVASCULAR STENT GRAFT  2014  . APPENDECTOMY    . BACK SURGERY    . BIOPSY N/A 06/10/2013   Procedure: BIOPSY;  Surgeon: NRogene Houston MD;  Location: [facility];  Service: Endoscopy;  Laterality: N/A;  . BIOPSY  12/03/2014   Procedure: BIOPSY (Gastroesophageal Junction);  Surgeon: NRogene Houston MD;  Location: [facility];  Service: Endoscopy;;  . ESOPHAGEAL DILATION N/A 12/03/2014   Procedure: ESOPHAGEAL DILATION WITH 54FR AND 56FR MALONEY;  Surgeon: NRogene Houston MD;  Location: [facility];  Service: Endoscopy;  Laterality: N/A;  . ESOPHAGOGASTRODUODENOSCOPY (EGD) WITH PROPOFOL N/A 06/10/2013   Procedure: ESOPHAGOGASTRODUODENOSCOPY (EGD) WITH PROPOFOL;  Surgeon: NRogene Houston MD;  Location: [facility];  Service: Endoscopy;  Laterality: N/A;  . ESOPHAGOGASTRODUODENOSCOPY (EGD) WITH PROPOFOL N/A 12/03/2014   Procedure: ESOPHAGOGASTRODUODENOSCOPY (EGD) WITH PROPOFOL;  Surgeon: NRogene Houston MD;  Location: [facility];  Service: Endoscopy;  Laterality: N/A;  . EVAR  06/20/10   for AAA repair   . LEFT HEART CATH AND CORONARY ANGIOGRAPHY N/A 02/04/2017   Procedure: LEFT HEART CATH AND CORONARY ANGIOGRAPHY;  Surgeon: BLorretta Harp MD;  [facility];  Service: Cardiovascular;  Laterality: N/A;  . MALONEY DILATION N/A 06/10/2013   Procedure: MALONEY DILATION 52 fr, 552fr;  Surgeon: NRogene Houston MD;  Location: [facility];   Service: Endoscopy;  Laterality: N/A;  . SPINAL FUSION     of neck and lower back   Social History   Social History Narrative   [place]. [place] Previously has worked [employer] Has exposure to fumes from glue. 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 No known asbestos exposure. Has 2 parakeets currently. Remotely raised parakeets, love birds, & cockatiels in a different house. No known mold or hot tub exposure.     Objective: Vital Signs: BP 116/68 (BP Location: Left Arm, Patient Position: Sitting, Cuff Size: Normal)   Pulse 64   Ht 5' 8" (1.727 m)   Wt 180 lb 3.2 oz (81.7 kg)   BMI 27.40 kg/m  Physical Exam  Constitutional: He is oriented to person, place, and time. He appears well-developed and well-nourished.  HENT:  Head: Normocephalic and atraumatic.  Eyes: Pupils are equal, round, and reactive to light. Conjunctivae and EOM are normal.  Neck: Normal range of motion. Neck supple.  Cardiovascular: Normal rate, regular rhythm and normal heart sounds.  Pulmonary/Chest: Effort normal. He has rales.  Bilateral lungs bases  Abdominal: Soft. Bowel sounds are normal.  Neurological: He is alert and oriented to person, place, and time.  Skin: Skin is warm and dry. Capillary refill takes less than 2 seconds.  Psychiatric: He has a normal mood and affect. His behavior is normal.  Nursing note and vitals reviewed.    Musculoskeletal Exam: C-spine limited range of motion.  Shoulder joints elbow joints wrist joints with good range of motion.  He has DIP and PIP thickening in his hands consistent with osteoarthritis.  His discomfort range of motion of right knee joint without any warmth swelling or effusion.  All other joints are full range of motion.  CDAI Exam: CDAI Score: Not documented Patient Global Assessment: Not documented; Provider Global  Assessment: Not documented Swollen: Not documented; Tender: Not documented Joint Exam   Not documented   There is currently no information documented on the homunculus. Go to the Rheumatology activity and complete the homunculus joint exam.  Investigation: No additional findings.  Imaging: Ct Chest High Resolution  Result Date: 11/21/2017 CLINICAL DATA:  Current smoker. Follow-up interstitial lung disease. EXAM: CT CHEST WITHOUT CONTRAST TECHNIQUE: Multidetector CT imaging of the chest was performed following the standard protocol without intravenous contrast. High resolution imaging of the lungs, as well as inspiratory and expiratory imaging, was performed. COMPARISON:  06/04/2017 high-resolution chest CT. FINDINGS: Cardiovascular: Normal heart size. No significant pericardial effusion/thickening. Left anterior descending and left circumflex coronary atherosclerosis. Atherosclerotic nonaneurysmal thoracic aorta. Normal caliber pulmonary arteries. Mediastinum/Nodes: No discrete thyroid nodules. Unremarkable esophagus. No pathologically enlarged hilar nodes. Mildly enlarged 1.3 cm right lower paratracheal node (series 2/image 65) is stable. No new pathologically enlarged mediastinal nodes. No discrete hilar adenopathy on this noncontrast scan. Lungs/Pleura: No pneumothorax. No pleural effusion. Mild centrilobular and paraseptal emphysema with mild diffuse bronchial wall thickening. Solid 9 mm anterior right lower lobe pulmonary nodule (series 3/image 97) and lingular 4 mm solid pulmonary nodule (series 3/image 103) are both stable since 10/18/2015 chest CT and considered benign. No acute consolidative airspace disease, lung masses or new significant pulmonary nodules. Previously described patchy tree-in-bud opacities in right upper lobe have resolved. There is patchy confluent perilobular and subpleural reticulation and ground-glass attenuation in both lungs with associated mild traction  bronchiolectasis and mild architectural distortion. There is a clear basilar gradient to these findings, which slightly asymmetrically involve the right lung. No frank honeycombing. No significant air trapping on the expiration sequence. There is continued mild progression of these findings since 06/04/2017 high-resolution chest CT study. Status Upper abdomen: No acute abnormality. Partial visualization of abdominal aortic stent graft. Musculoskeletal: No aggressive appearing focal osseous lesions. Mild thoracic spondylosis. IMPRESSION: 1. Continued mild progression of basilar predominant fibrotic interstitial lung disease without frank honeycombing. Findings are categorized as probable UIP per consensus guidelines: Diagnosis of Idiopathic Pulmonary Fibrosis: An Official ATS/ERS/JRS/ALAT Clinical Practice Guideline. [place], Iss 5, [phone], Sep 22 2016. 2. Mild mediastinal lymphadenopathy is stable, nonspecific and more likely reactive. 3. Two-vessel coronary atherosclerosis. Aortic Atherosclerosis (ICD10-I70.0) and Emphysema (ICD10-J43.9). Electronically Signed   By: Ilona Sorrel  M.D.   On: 11/21/2017 12:47   Xr Hand 2 View Left  Result Date: 11/12/2017 Minimal first second and third MCP narrowing was noted.  PIP and DIP narrowing was noted.  No juxta articular osteopenia was noted.  No erosive changes were noted.  No intercarpal radiocarpal joint space narrowing was noted. Impression: These findings are consistent with osteoarthritis of the hand.  Xr Hand 2 View Right  Result Date: 11/12/2017 Minimal first second and third MCP narrowing was noted.  PIP and DIP narrowing was noted.  No juxta articular osteopenia was noted.  No erosive changes were noted.  No intercarpal radiocarpal joint space narrowing was noted. Impression: These findings are consistent with osteoarthritis of the hand.   Recent Labs: Lab Results  Component Value Date   WBC 11.3 (H) 06/05/2017   HGB 13.5  06/05/2017   PLT 342.0 06/05/2017   NA 140 04/08/2017   K 4.1 04/08/2017   CL 105 04/08/2017   CO2 25 04/08/2017   GLUCOSE 107 (H) 04/08/2017   BUN 9 04/08/2017   CREATININE 1.23 04/08/2017   BILITOT 0.6 04/08/2017   ALKPHOS 51 04/08/2017   AST 20 04/08/2017   ALT 15 (L) 04/08/2017   PROT 7.2 11/12/2017   ALBUMIN 3.9 04/08/2017   CALCIUM 8.9 04/08/2017   GFRAA >60 04/08/2017  UA negative, SPEP normal, CK 94, ESR 9, RNP positive, Smith negative, dsDNA negative, SCL 70-, ACE 49, C3-C4 normal     Jun 05, 2017 ANA 1: 320 nucleolar, SSA negative, SSB negative, ANCA negative, RF negative, anti-CCP negative Speciality Comments: No specialty comments available.  Procedures:  No procedures performed Allergies: Benazepril hcl; Pantoprazole sodium; and Amlodipine   Assessment / Plan:     Visit Diagnoses: Positive ANA (antinuclear antibody), positive RNP - ANA 1: 320 nucleolar, positive RNP (rest of the ENA negative, C3-C4 normal, ESR normal ) patient has no other clinical features of autoimmune disease.  I detailed discussion regarding the lab results with patient.  Idiopathic pulmonary fibrosis ([place]) -I discussed lab findings with Dr. Vaughan Browner after the lab results were available.  He plans to do a lung biopsy.  Primary osteoarthritis of both hands-joint protection muscle strengthening was discussed.  Primary osteoarthritis of right knee-he has osteoarthritis in his right knee joint for which she has been getting cortisone injections by his PCP.  He states that right knee joint discomfort has recurred.  I will give him a prescription for Voltaren gel.  Indications side effects contraindications were discussed at length.  DDD (degenerative disc disease), cervical - Status post fusion  Pulmonary nodule  Other emphysema ([facility])  Tobacco use disorder-smoking cessation was discussed.  CKD (chronic kidney disease), stage II   History of gastroesophageal reflux (GERD)  History of hiatal  hernia  History of diverticulosis  History of hypertension  History of hyperlipidemia  History of abdominal aortic aneurysm (AAA)  Orders: No orders of the defined types were placed in this encounter.  Meds ordered this encounter  Medications  . diclofenac sodium (VOLTAREN) 1 % GEL    Sig: 3 grams to 3 large joints up to 3 times daily    Dispense:  3 Tube    Refill:  3     Follow-Up Instructions: Return in about 4 months (around 04/03/2018) for Osteoarthritis, positive ANA and RNP, IPF.   Bo Merino, MD  Note - This record has been created using Editor, commissioning.  Chart creation errors have been sought, but may not always  have been  located. Such creation errors do not reflect on  the standard of medical care.

## 2017-11-29 ENCOUNTER — Telehealth: Payer: Self-pay | Admitting: Pulmonary Disease

## 2017-11-29 ENCOUNTER — Ambulatory Visit (INDEPENDENT_AMBULATORY_CARE_PROVIDER_SITE_OTHER): Payer: Medicare Other | Admitting: Pulmonary Disease

## 2017-11-29 DIAGNOSIS — J849 Interstitial pulmonary disease, unspecified: Secondary | ICD-10-CM

## 2017-11-29 LAB — PULMONARY FUNCTION TEST
DL/VA % pred: 59 %
DL/VA: 2.69 ml/min/mmHg/L
DLCO UNC % PRED: 43 %
DLCO UNC: 12.84 ml/min/mmHg
FEF 25-75 PRE: 1.85 L/s
FEF2575-%PRED-PRE: 71 %
FEV1-%Pred-Pre: 70 %
FEV1-PRE: 2.29 L
FEV1FVC-%Pred-Pre: 101 %
FEV6-%PRED-PRE: 73 %
FEV6-Pre: 3.01 L
FEV6FVC-%PRED-PRE: 105 %
FVC-%Pred-Pre: 69 %
FVC-Pre: 3.01 L
PRE FEV1/FVC RATIO: 76 %
Pre FEV6/FVC Ratio: 100 %

## 2017-11-29 NOTE — Telephone Encounter (Signed)
Left detailed message requesting that pt arrive 30 min to OV on 12/02/17 to complete ILD questionnaire.

## 2017-11-29 NOTE — Progress Notes (Signed)
Spirometry and Dlco done today. 

## 2017-12-02 ENCOUNTER — Ambulatory Visit (INDEPENDENT_AMBULATORY_CARE_PROVIDER_SITE_OTHER): Payer: Medicare Other | Admitting: Pulmonary Disease

## 2017-12-02 ENCOUNTER — Encounter: Payer: Self-pay | Admitting: Pulmonary Disease

## 2017-12-02 VITALS — BP 142/84 | HR 62 | Ht 69.0 in | Wt 179.0 lb

## 2017-12-02 DIAGNOSIS — J849 Interstitial pulmonary disease, unspecified: Secondary | ICD-10-CM | POA: Diagnosis not present

## 2017-12-02 DIAGNOSIS — F17200 Nicotine dependence, unspecified, uncomplicated: Secondary | ICD-10-CM

## 2017-12-02 DIAGNOSIS — R911 Solitary pulmonary nodule: Secondary | ICD-10-CM

## 2017-12-02 NOTE — Patient Instructions (Addendum)
Will refer you to cardiothoracic surgery, Dr. Roxan Hockey for evaluation of lung biopsy  Follow up in 2 months

## 2017-12-02 NOTE — Progress Notes (Addendum)
[name]    [id number]    August 20, 1950  Primary Care Physician:Mann, Carlean Jews, PA-C  Referring Physician: Cory Munch, PA-C [place], [place] [id number]  Chief complaint:   Follow-up for emphysema, active smoker, ILD  HPI: 67 year old with history of emphysema, hemoptysis, GERD, active smoker hypertension, coronary artery disease, AAA.  He was previously followed by Dr. Ashok Cordia.  History noted for pulmonary emphysema with no obstruction on PFTs.  History of hemoptysis several years ago which has resolved, no parenchymal abnormalities on chest imaging.  He also has history of severe GERD status post esophageal dilatation in 2016 and 2018.  He continues on antiacid medication.  He was hospitalized in January 2019 for atypical chest pain.  Underwent left heart cath on 02/04/17 which was negative for any abnormality of his coronary arteries.  Pets: Has a dog, cat, chicken, [place], pheasent, peacock in his yard.  He has a parakeet inside his house. Occupation: Used to work Biomedical engineer.  Currently retired Exposures: Exposure to fumes and dust in his line of work.  No mold, hot tub at home Smoking history: 50-pack-year smoking history.  Continues to smoke half pack per day Travel History: Used to travel while working.  No significant travel recently.  Interim history: We advised him to get rid of the [facility].  He has not done so since he is attached to the pet however he is moved it to a different room and is trying to limit his exposure to it. Evaluated by Dr. 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  Continues to smoke.  Has chronic dyspnea on exertion, cough with white mucus production.  Outpatient Encounter Medications as of 12/02/2017  Medication Sig  . albuterol (PROAIR HFA) 108 (90 Base) MCG/ACT inhaler Inhale 2 puffs every 6 (six) hours as needed into the lungs. (Patient taking  differently: Inhale 2 puffs into the lungs every 6 (six) hours as needed for wheezing or shortness of breath. )  . aspirin EC 81 MG tablet Take 81 mg by mouth daily.  . budesonide-formoterol (SYMBICORT) 160-4.5 MCG/ACT inhaler Inhale 2 puffs into the lungs 2 (two) times daily.  Marland Kitchen dextromethorphan-guaiFENesin (MUCINEX DM) 30-600 MG 12hr tablet Take 1 tablet by mouth 2 (two) times daily.  . fluticasone (FLONASE) 50 MCG/ACT nasal spray SPRAY TWICE INTO EACH NOSTRIL EVERY DAY  . losartan-hydrochlorothiazide (HYZAAR) 100-12.5 MG tablet Take one-half tablet daily by mouth (Patient taking differently: Take 0.25 tablets by mouth daily as needed (SBP >130 or headache from high blood pressure). )  . meclizine (ANTIVERT) 25 MG tablet Take 1 tablet (25 mg total) by mouth 3 (three) times daily as needed for dizziness.  Marland Kitchen NEXIUM 40 MG capsule TAKE 1 CAPSULE BY MOUTH 2 TIMES DAILY BEFORE A MEAL.  Marland Kitchen Oxycodone HCl 20 MG TABS Take 20 mg by mouth every 4 (four) hours.   . Pediatric Multivit-Minerals-C (KIDS GUMMY BEAR VITAMINS) CHEW Chew 1-2 tablets by mouth every other day.  . sodium chloride (OCEAN) 0.65 % SOLN nasal spray Place 1 spray into both nostrils 3 (three) times daily as needed for congestion.   Marland Kitchen Spacer/Aero-Holding Chambers (AEROCHAMBER MV) inhaler Use as instructed (Patient taking differently: See admin instructions. Use as instructed with Symbicort)   No facility-administered encounter medications on file as of 12/02/2017.    Physical Exam: Blood pressure (!) 142/84, pulse 62, height 5\' 9"  (1.753 m), weight 179 lb (81.2  kg), SpO2 97 %. Gen:      No acute distress HEENT:  EOMI, sclera anicteric Neck:     No masses; no thyromegaly Lungs:    Clear to auscultation bilaterally; normal respiratory effort CV:         Regular rate and rhythm; no murmurs Abd:      + bowel sounds; soft, non-tender; no palpable masses, no distension Ext:    No edema; adequate peripheral perfusion Skin:      Warm and dry; no  rash Neuro: alert and oriented x 3 Psych: normal mood and affect  Data Reviewed: Imaging: CT abdomen pelvis 07/20/10-lung images show subtle basilar atelectasis CT scan 10/13/14-dependent bibasilar atelectasis, left upper lobe 3 mm nodule CT scan 03/31/15- atelectasis, stable left upper lobe 3 mm nodule CT scan 10/18/15- atelectasis, stable left upper lobe 3 mm nodule. Chest x-ray 02/03/17-suspected mild interstitial lung disease in the lower lobes.  High res CT chest 06/04/17-bibasilar fibrosis with mild bronchiectasis.  No honeycombing.  Patchy tree-in-bud opacities in the right upper lobe.  Left main and coronary atheosclerosis High res CT chest 11/21/2017-progression of mild basilar predominant fibrotic lung disease, no honeycombing.  Probable UIP.  Two-vessel coronary atherosclerosis I have reviewed the images personally.  PFTs  12/31/14-FVC 3.44 [98%], FEV1 2.82 [85%], F/F 82, TLC 80%, DLCO 61% Moderate reduction in diffusion capacity.  No obstruction or restriction.  05/17/15-FVC 4.36 [82%], FEV1 3.28 [83%], F/F 75, DLCO 55% Normal spirometry.  Moderate reduction in diffusion capacity  06/12/17- FVC 4.29 [78%], FEV1 2.20 [83%), F/F 75, DLCO 54% Moderate diffusion defect  6MWT 12/31/14- 326 meters / Baseline Sat 96% on RA / Nadir Sat 96% on RA 09/20/17-336 m, baseline sat 98%, nadir sat 98% on room air  Labs 10/29/14 ALPHA-1 AT: MM (145)  Connective tissue serologies 06/05/2017-positive ANA, 1:320 titer, nucleolar pattern Hypersensitivity panel, CCP, Sjogren's panel, ANCA 06/05/2017-negative  Sputum culture 03/02/2017- AFB negative, fungus culture-light growth of yeast Normal oropharyngeal flora.  Assessment:  Evaluation for interstitial lung disease CT scan reviewed lower lobe reticulation, scarring with no clear evidence of honeycombing This could be from hypersensitivity pneumonitis although the pattern of lower lobe predominance is unusual Other possibilities include chronic  aspiration. He does have a parakeet at home which he is reluctant to get rid of, however he is avoiding significant exposure by moving into a different room. Connective tissue disease serology reviewed with positive ANA.   Differential diagnosis at this point includes chronic hypersensitivity pneumonitis, fibrotic NSIP from connective tissue disease, IPF. Since the treatment is different for each of these we will need a definitve diagnosis. I have recommended that he get surgical lung biopsy and he agrees Refer to CT surgery  Emphysema PFTs do not show any obstruction.  Continues on Symbicort which he is using intermittently Continues to smoke.  Encouraged smoking cessation.  Tree-in-bud opacities in the right upper lobe Likely nonspecific.  Sputum for AFB is negative.  Subcentimeter pulmonary nodule Has remained stable on follow-up imaging from 2016 to 2017.  This likely benign.   Follow-up on repeat imaging  Acid reflux, Barrett's esophagus Status post dilation by GI.  Continues on Nexium.    Chronic sinusitis, postnasal drip OTC chlorphentermine and Flonase nasal spray.  Health maintenance Declined vaccination  Plan/Recommendations: - Referral for surgical lung biopsy - Continue Symbicort, smoking cessation  Marshell Garfinkel MD [facility] 12/02/2017, 3:07 PM  CC: Cory Munch, PA-C

## 2017-12-03 ENCOUNTER — Encounter: Payer: Self-pay | Admitting: Rheumatology

## 2017-12-03 ENCOUNTER — Ambulatory Visit (INDEPENDENT_AMBULATORY_CARE_PROVIDER_SITE_OTHER): Payer: Medicare Other | Admitting: Rheumatology

## 2017-12-03 VITALS — BP 116/68 | HR 64 | Ht 68.0 in | Wt 180.2 lb

## 2017-12-03 DIAGNOSIS — M19042 Primary osteoarthritis, left hand: Secondary | ICD-10-CM | POA: Diagnosis not present

## 2017-12-03 DIAGNOSIS — N182 Chronic kidney disease, stage 2 (mild): Secondary | ICD-10-CM

## 2017-12-03 DIAGNOSIS — M19041 Primary osteoarthritis, right hand: Secondary | ICD-10-CM | POA: Diagnosis not present

## 2017-12-03 DIAGNOSIS — R911 Solitary pulmonary nodule: Secondary | ICD-10-CM | POA: Diagnosis not present

## 2017-12-03 DIAGNOSIS — J84112 Idiopathic pulmonary fibrosis: Secondary | ICD-10-CM | POA: Diagnosis not present

## 2017-12-03 DIAGNOSIS — M5030 Other cervical disc degeneration, unspecified cervical region: Secondary | ICD-10-CM

## 2017-12-03 DIAGNOSIS — M1711 Unilateral primary osteoarthritis, right knee: Secondary | ICD-10-CM

## 2017-12-03 DIAGNOSIS — R768 Other specified abnormal immunological findings in serum: Secondary | ICD-10-CM | POA: Diagnosis not present

## 2017-12-03 DIAGNOSIS — J438 Other emphysema: Secondary | ICD-10-CM | POA: Diagnosis not present

## 2017-12-03 DIAGNOSIS — F17200 Nicotine dependence, unspecified, uncomplicated: Secondary | ICD-10-CM

## 2017-12-03 MED ORDER — DICLOFENAC SODIUM 1 % TD GEL: TRANSDERMAL | 3 refills | Status: DC

## 2017-12-04 ENCOUNTER — Encounter: Payer: Self-pay | Admitting: Pulmonary Disease

## 2017-12-12 ENCOUNTER — Other Ambulatory Visit (INDEPENDENT_AMBULATORY_CARE_PROVIDER_SITE_OTHER): Payer: Self-pay | Admitting: *Deleted

## 2017-12-12 MED ORDER — ESOMEPRAZOLE MAGNESIUM 40 MG PO CPDR: 40.0000 mg | DELAYED_RELEASE_CAPSULE | Freq: Two times a day (BID) | ORAL | 5 refills | Status: DC

## 2017-12-25 ENCOUNTER — Other Ambulatory Visit: Payer: Self-pay

## 2017-12-25 ENCOUNTER — Encounter: Payer: Self-pay | Admitting: *Deleted

## 2017-12-25 ENCOUNTER — Other Ambulatory Visit: Payer: Self-pay | Admitting: *Deleted

## 2017-12-25 ENCOUNTER — Encounter: Payer: Self-pay | Admitting: Thoracic Surgery (Cardiothoracic Vascular Surgery)

## 2017-12-25 ENCOUNTER — Institutional Professional Consult (permissible substitution) (INDEPENDENT_AMBULATORY_CARE_PROVIDER_SITE_OTHER): Payer: Medicare HMO | Admitting: Thoracic Surgery (Cardiothoracic Vascular Surgery)

## 2017-12-25 VITALS — BP 128/82 | HR 66 | Resp 20 | Ht 68.0 in | Wt 183.4 lb

## 2017-12-25 DIAGNOSIS — J849 Interstitial pulmonary disease, unspecified: Secondary | ICD-10-CM | POA: Diagnosis not present

## 2017-12-25 DIAGNOSIS — R911 Solitary pulmonary nodule: Secondary | ICD-10-CM | POA: Diagnosis not present

## 2017-12-25 DIAGNOSIS — I700 Atherosclerosis of aorta: Secondary | ICD-10-CM | POA: Diagnosis not present

## 2017-12-25 DIAGNOSIS — J84112 Idiopathic pulmonary fibrosis: Secondary | ICD-10-CM | POA: Diagnosis not present

## 2017-12-25 DIAGNOSIS — J439 Emphysema, unspecified: Secondary | ICD-10-CM | POA: Diagnosis not present

## 2017-12-25 NOTE — Progress Notes (Signed)
PCP is Ginger Organ Referring Provider is Marshell Garfinkel, MD  Chief Complaint  Patient presents with  . Lung Lesion    Surgical eval,  Chest CT 11/21/17    HPI: [name] is sent for evaluation for possible lung biopsy.  [name] is a 67 year old man with a history of tobacco abuse, emphysema, hemoptysis, hyperlipidemia, stent graft for abdominal aortic aneurysm, stage II chronic kidney disease, degenerative disc disease, cervical fusion, hiatal hernia, and reflux.  He has been followed by pulmonary since having hemoptysis a couple of years ago.  That ultimately resolved.  He has also been followed for some small lung nodules which have remained stable over time.  He also has been followed for interstitial lung disease.  He gets short of breath with exertion.  He says that he can walk a good ways at a slow pace on level ground but gets short of breath if he walks up an incline or up a flight of stairs.  His respiratory symptoms have been relatively stable but he recently saw Dr. Vaughan Browner and his CT showed progression of basilar predominant fibrotic lung disease, suspicious for UIP.  Dr. Vaughan Browner is concerned that it could be hypersensitivity pneumonitis, fibrotic NSIP secondary to connective tissue disease, or idiopathic pulmonary fibrosis.  He needs a definitive diagnosis to guide therapy.  He is currently smoking about a pack a day.  He started age 43.  He quit for 8 years at 1 point.  Cardiac catheterization in January 2019 showed normal coronary arteries normal left ventricular dysfunction Past Medical History:  Diagnosis Date  . AAA (abdominal aortic aneurysm) ([place])    a. s/p EVAR 05/2010.  . Back pain   . CKD (chronic kidney disease), stage II   . Degenerative disk disease    a. Chronic back pain with ridiculopathy.  . Diverticulosis   . Emphysema of lung ([place])   . Essential hypertension   . GERD (gastroesophageal reflux disease)   . Hiatal hernia   . Hyperlipidemia   .  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 Pulmonary nodule    a. 55mm nodule seen on CT 09/2014 - f/u recommended 1 yr.  . Skin cancer   . Tobacco abuse     Past Surgical History:  Procedure Laterality Date  . ABDOMINAL AORTIC ENDOVASCULAR STENT GRAFT  2014  . APPENDECTOMY    . BACK SURGERY    . BIOPSY N/A 06/10/2013   Procedure: BIOPSY;  Surgeon: Rogene Houston, MD;  Location: [facility];  Service: Endoscopy;  Laterality: N/A;  . BIOPSY  12/03/2014   Procedure: BIOPSY (Gastroesophageal Junction);  Surgeon: Rogene Houston, MD;  Location: [facility];  Service: Endoscopy;;  . ESOPHAGEAL DILATION N/A 12/03/2014   Procedure: ESOPHAGEAL DILATION WITH 54FR AND 56FR MALONEY;  Surgeon: Rogene Houston, MD;  Location: [facility];  Service: Endoscopy;  Laterality: N/A;  . ESOPHAGOGASTRODUODENOSCOPY (EGD) WITH PROPOFOL N/A 06/10/2013   Procedure: ESOPHAGOGASTRODUODENOSCOPY (EGD) WITH PROPOFOL;  Surgeon: Rogene Houston, MD;  Location: [facility];  Service: Endoscopy;  Laterality: N/A;  . ESOPHAGOGASTRODUODENOSCOPY (EGD) WITH PROPOFOL N/A 12/03/2014   Procedure: ESOPHAGOGASTRODUODENOSCOPY (EGD) WITH PROPOFOL;  Surgeon: Rogene Houston, MD;  Location: [facility];  Service: Endoscopy;  Laterality: N/A;  . EVAR  06/20/10   for AAA repair   . LEFT HEART CATH AND CORONARY ANGIOGRAPHY N/A 02/04/2017   Procedure: LEFT HEART CATH AND CORONARY ANGIOGRAPHY;  Surgeon: Lorretta Harp, MD;  Location: [facility];  Service: Cardiovascular;  Laterality: N/A;  . MALONEY DILATION N/A 06/10/2013   Procedure: MALONEY DILATION 52 fr, 79 fr;  Surgeon: Rogene Houston, MD;  Location: [facility];  Service: Endoscopy;  Laterality: N/A;  . SPINAL FUSION     of neck and lower back    Family History  Problem Relation Age of Onset  . Atrial fibrillation Mother   . Heart disease Mother        Patient unclear of what kind  . Deep vein thrombosis Mother   . Hyperlipidemia Mother   . Hypertension Mother   .  Stroke Father   . Diabetes Father   . Heart disease Father        Patient unclear of what kind  . Kidney disease Father   . Hyperlipidemia Father   . Hypertension Father   . Diabetes Sister   . Heart disease Sister        Patient unclear of what kind  . Brain cancer Sister   . Seizures Sister   . Deep vein thrombosis Other   . Pulmonary embolism Other   . Cancer Paternal Grandmother   . Lung disease Neg Hx   . Colon cancer Neg Hx   . Esophageal cancer Neg Hx   . Rectal cancer Neg Hx   . Stomach cancer Neg Hx     Social History Social History   Tobacco Use  . Smoking status: Current Every Day Smoker    Packs/day: 1.00    Years: 48.00    Pack years: 48.00    Types: Cigarettes    Start date: 12/16/1965  . Smokeless tobacco: Never Used  Substance Use Topics  . Alcohol use: No    Alcohol/week: 0.0 standard drinks  . Drug use: No    Current Outpatient Medications  Medication Sig Dispense Refill  . albuterol (PROAIR HFA) 108 (90 Base) MCG/ACT inhaler Inhale 2 puffs every 6 (six) hours as needed into the lungs. (Patient taking differently: Inhale 2 puffs into the lungs every 6 (six) hours as needed for wheezing or shortness of breath. ) 1 Inhaler 6  . aspirin EC 81 MG tablet Take 81 mg by mouth daily.    . budesonide-formoterol (SYMBICORT) 160-4.5 MCG/ACT inhaler Inhale 2 puffs into the lungs 2 (two) times daily. 1 Inhaler 6  . dextromethorphan-guaiFENesin (MUCINEX DM) 30-600 MG 12hr tablet Take 1 tablet by mouth 2 (two) times daily.    . diclofenac sodium (VOLTAREN) 1 % GEL 3 grams to 3 large joints up to 3 times daily 3 Tube 3  . esomeprazole (NEXIUM) 40 MG capsule Take 1 capsule (40 mg total) by mouth 2 (two) times daily before a meal. 60 capsule 5  . fluticasone (FLONASE) 50 MCG/ACT nasal spray SPRAY TWICE INTO EACH NOSTRIL EVERY DAY 16 g 2  . losartan-hydrochlorothiazide (HYZAAR) 100-12.5 MG tablet Take one-half tablet daily by mouth (Patient taking differently: Take 0.25  tablets by mouth daily as needed (SBP >130 or headache from high blood pressure). ) 30 tablet 3  . meclizine (ANTIVERT) 25 MG tablet Take 1 tablet (25 mg total) by mouth 3 (three) times daily as needed for dizziness. 30 tablet 0  . NEXIUM 40 MG capsule TAKE 1 CAPSULE BY MOUTH 2 TIMES DAILY BEFORE A MEAL. 60 capsule 5  . Oxycodone HCl 20 MG TABS Take 20 mg by mouth every 4 (four) hours.     . Pediatric [place] (KIDS GUMMY BEAR VITAMINS) CHEW Chew  1-2 tablets by mouth every other day.    . sodium chloride (OCEAN) 0.65 % SOLN nasal spray Place 1 spray into both nostrils 3 (three) times daily as needed for congestion.     Marland Kitchen Spacer/Aero-Holding Chambers (AEROCHAMBER MV) inhaler Use as instructed (Patient taking differently: See admin instructions. Use as instructed with Symbicort) 1 each 0   No current facility-administered medications for this visit.     Allergies  Allergen Reactions  . Benazepril Hcl Shortness Of Breath and Other (See Comments)    Can't breathe  . Pantoprazole Sodium Shortness Of Breath and Other (See Comments)    Can't breathe  . Amlodipine Swelling    Location of swelling not recalled by the patient    Review of Systems  Constitutional: Negative for activity change, fatigue and unexpected weight change.  HENT: Positive for dental problem (Dentures).   Respiratory: Positive for shortness of breath.   Cardiovascular: Positive for chest pain (None recently).  Gastrointestinal: Positive for abdominal pain (Reflux). Negative for blood in stool.  Genitourinary: Positive for frequency. Negative for dysuria.       "Kidney disease"  Musculoskeletal: Positive for arthralgias.       Leg cramps  Neurological: Positive for dizziness. Negative for syncope and weakness.  Hematological: Negative for adenopathy. Does not bruise/bleed easily.  All other systems reviewed and are negative.   BP 128/82 (BP Location: Right Arm, Patient Position: Sitting, Cuff Size: Large)    Pulse 66   Resp 20   Ht 5\' 8"  (1.727 m)   Wt 183 lb 6.4 oz (83.2 kg)   SpO2 97% Comment: RA  BMI 27.89 kg/m  Physical Exam  Constitutional: He is oriented to person, place, and time. He appears well-developed and well-nourished. No distress.  HENT:  Head: Normocephalic and atraumatic.  Eyes: Conjunctivae and EOM are normal. No scleral icterus.  Neck: No thyromegaly present.  Limited ROM  Cardiovascular: Normal rate, regular rhythm and normal heart sounds. Exam reveals no gallop and no friction rub.  No murmur heard. Pulmonary/Chest: Effort normal. No respiratory distress. He has no wheezes. He has rales (Faint right base).  Abdominal: Soft. He exhibits no distension. There is no tenderness.  Musculoskeletal: He exhibits no edema.  Lymphadenopathy:    He has no cervical adenopathy.  Neurological: He is alert and oriented to person, place, and time. No cranial nerve deficit. He exhibits normal muscle tone. Coordination normal.  Skin: Skin is warm and dry.  Vitals reviewed.    Diagnostic Tests: CT CHEST WITHOUT CONTRAST  TECHNIQUE: Multidetector CT imaging of the chest was performed following the standard protocol without intravenous contrast. High resolution imaging of the lungs, as well as inspiratory and expiratory imaging, was performed.  COMPARISON:  06/04/2017 high-resolution chest CT.  FINDINGS: Cardiovascular: Normal heart size. No significant pericardial effusion/thickening. Left anterior descending and left circumflex coronary atherosclerosis. Atherosclerotic nonaneurysmal thoracic aorta. Normal caliber pulmonary arteries.  Mediastinum/Nodes: No discrete thyroid nodules. Unremarkable esophagus. No pathologically enlarged hilar nodes. Mildly enlarged 1.3 cm right lower paratracheal node (series 2/image 65) is stable. No new pathologically enlarged mediastinal nodes. No discrete hilar adenopathy on this noncontrast scan.  Lungs/Pleura: No pneumothorax. No  pleural effusion. Mild centrilobular and paraseptal emphysema with mild diffuse bronchial wall thickening. Solid 9 mm anterior right lower lobe pulmonary nodule (series 3/image 97) and lingular 4 mm solid pulmonary nodule (series 3/image 103) are both stable since 10/18/2015 chest CT and considered benign. No acute consolidative airspace disease, lung masses or new significant  pulmonary nodules. Previously described patchy tree-in-bud opacities in right upper lobe have resolved. There is patchy confluent perilobular and subpleural reticulation and ground-glass attenuation in both lungs with associated mild traction bronchiolectasis and mild architectural distortion. There is a clear basilar gradient to these findings, which slightly asymmetrically involve the right lung. No frank honeycombing. No significant air trapping on the expiration sequence. There is continued mild progression of these findings since 06/04/2017 high-resolution chest CT study. Status  Upper abdomen: No acute abnormality. Partial visualization of abdominal aortic stent graft.  Musculoskeletal: No aggressive appearing focal osseous lesions. Mild thoracic spondylosis.  IMPRESSION: 1. Continued mild progression of basilar predominant fibrotic interstitial lung disease without frank honeycombing. Findings are categorized as probable UIP per consensus guidelines: Diagnosis of Idiopathic Pulmonary Fibrosis: An Official ATS/ERS/JRS/ALAT Clinical Practice Guideline. [place]ning, Iss 5, [phone], Sep 22 2016. 2. Mild mediastinal lymphadenopathy is stable, nonspecific and more likely reactive. 3. Two-vessel coronary atherosclerosis.  Aortic Atherosclerosis (ICD10-I70.0) and Emphysema (ICD10-J43.9).   Electronically Signed   By: Ilona Sorrel M.D.   On: 11/21/2017 12:47 I personally reviewed the CT images and concur with the findings noted above  Impression: [name] is a 67 year old  man with a history of tobacco abuse, emphysema, and interstitial lung disease, who has exertional dyspnea.  His recent CT showed progression of his interstitial lung disease.  He needs a surgical lung biopsy to guide treatment.  I described the proposed of right VATS for lung biopsy.  I informed him of the general nature of the procedure, the need for general anesthesia, the incisions to be used, the use of drains to postoperatively, the expected hospital stay, and the overall recovery.  I informed her the indications, risk, benefits, and alternatives.  He understands the risks include, but are not limited to death, MI, DVT, PE, bleeding, possible need for transfusion, infection, prolonged air leak, cardiac arrhythmias, as well as the possibility of other unforeseeable complications.  He accepts the risk and wished to proceed.  He wishes to wait till after the first of the year.  Lung nodule-stable 4 mm calcified nodule in left upper lobe.  9 mm nodule in the the right lower lobe.  We will assess whether that nodule could be removed with a wedge resection at the time of his lung biopsy.  However it is relatively central and in close proximity to a large pulmonary artery branch so I am not sure that will be technically feasible.  It has been stable for the past 2 years so it is likely benign.  Coronary calcification on CT-catheterization in January 2019 showed no significant coronary disease.  No anginal symptoms.  Tobacco abuse-emphasized the importance of smoking cessation.  Dr. Vaughan Browner has been working with him on this issue.  Plan: Right VATS for lung biopsy on 01/30/2018  Melrose Nakayama, MD [facility] [phone]

## 2018-01-22 DIAGNOSIS — J984 Other disorders of lung: Secondary | ICD-10-CM

## 2018-01-22 HISTORY — DX: Other disorders of lung: J98.4

## 2018-01-24 DIAGNOSIS — Z6828 Body mass index (BMI) 28.0-28.9, adult: Secondary | ICD-10-CM | POA: Diagnosis not present

## 2018-01-24 DIAGNOSIS — I714 Abdominal aortic aneurysm, without rupture: Secondary | ICD-10-CM | POA: Diagnosis not present

## 2018-01-24 DIAGNOSIS — I10 Essential (primary) hypertension: Secondary | ICD-10-CM | POA: Diagnosis not present

## 2018-01-24 DIAGNOSIS — J449 Chronic obstructive pulmonary disease, unspecified: Secondary | ICD-10-CM | POA: Diagnosis not present

## 2018-01-24 DIAGNOSIS — J9801 Acute bronchospasm: Secondary | ICD-10-CM | POA: Diagnosis not present

## 2018-01-24 DIAGNOSIS — J84112 Idiopathic pulmonary fibrosis: Secondary | ICD-10-CM | POA: Diagnosis not present

## 2018-01-24 DIAGNOSIS — J329 Chronic sinusitis, unspecified: Secondary | ICD-10-CM | POA: Diagnosis not present

## 2018-01-24 DIAGNOSIS — N400 Enlarged prostate without lower urinary tract symptoms: Secondary | ICD-10-CM | POA: Diagnosis not present

## 2018-01-24 DIAGNOSIS — R42 Dizziness and giddiness: Secondary | ICD-10-CM | POA: Diagnosis not present

## 2018-01-27 NOTE — Pre-Procedure Instructions (Signed)
[name]  01/27/2018      [id number] [place], [place] [phone] [facility] [address] [place] [address] [place] [address] Phone: [phone] Fax: [phone]  [id number] [place], [place] [address] [place] [address] Phone: [phone] Fax: [phone]    Your procedure is scheduled on Thursday January 9th.  Report to [facility] Admitting at 6:00 A.M.  Call this number if you have problems the morning of surgery:  [phone]   Remember:  Do not eat or drink after midnight.    Take these medicines the morning of surgery with A SIP OF WATER  albuterol (PROAIR HFA)-if needed *bring with you to the hospital budesonide-formoterol (SYMBICORT) esomeprazole (NEXIUM) fluticasone (FLONASE) meclizine (ANTIVERT)- if needed Oxycodone - if needed  Follow your surgeon's instructions on when to stop Asprin.  If no instructions were given by your surgeon then you will need to call the office to get those instructions.    7 days prior to surgery STOP taking any Aspirin(unless otherwise instructed by your surgeon), Aleve, Naproxen, Ibuprofen, Motrin, Advil, Goody's, BC's, all herbal medications, fish oil, and all vitamins     Do not wear jewelry.  Do not wear lotions, powders, or colognes, or deodorant.  Do not shave 48 hours prior to surgery.  Men may shave face and neck.  Do not bring valuables to the hospital.  [facility] is not responsible for any belongings or valuables.  Contacts, eyeglasses, hearing aids,  dentures or bridgework may not be worn into surgery.  Leave your suitcase in the car.  After surgery it may be brought to your room.  For patients admitted to the hospital, discharge time will be determined by your treatment team.  Patients discharged the day of surgery will not be allowed to drive home.   [place]- Preparing For  Surgery  Before surgery, you can play an important role. Because skin is not sterile, your skin needs to be as free of germs as possible. You can reduce the number of germs on your skin by washing with CHG (chlorahexidine gluconate) Soap before surgery.  CHG is an antiseptic cleaner which kills germs and bonds with the skin to continue killing germs even after washing.    Oral Hygiene is also important to reduce your risk of infection.  Remember - BRUSH YOUR TEETH THE MORNING OF SURGERY WITH YOUR REGULAR TOOTHPASTE  Please do not use if you have an allergy to CHG or antibacterial soaps. If your skin becomes reddened/irritated stop using the CHG.  Do not shave (including legs and underarms) for at least 48 hours prior to first CHG shower. It is OK to shave your face.  Please follow these instructions carefully.   1. Shower the NIGHT BEFORE SURGERY and the MORNING OF SURGERY with CHG.   2. If you chose to wash your hair, wash your hair first as usual with your normal shampoo.  3. After you shampoo, rinse your hair and body thoroughly to remove the shampoo.  4. Use CHG as you would any other liquid soap. You can apply CHG directly to the skin and wash gently with a scrungie or a clean washcloth.   5. Apply the CHG Soap to your body ONLY FROM THE NECK DOWN.  Do not use on open wounds or open sores. Avoid contact with your eyes, ears, mouth and genitals (private  parts). Wash Face and genitals (private parts)  with your normal soap.  6. Wash thoroughly, paying special attention to the area where your surgery will be performed.  7. Thoroughly rinse your body with warm water from the neck down.  8. DO NOT shower/wash with your normal soap after using and rinsing off the CHG Soap.  9. Pat yourself dry with a CLEAN TOWEL.  10. Wear CLEAN PAJAMAS to bed the night before surgery, wear comfortable clothes the morning of surgery  11. Place CLEAN SHEETS on your bed the night of your first shower and  DO NOT SLEEP WITH PETS.    Day of Surgery: Shower as stated above. Do not apply any deodorants/lotions.  Please wear clean clothes to the hospital/surgery center.   Remember to brush your teeth WITH YOUR REGULAR TOOTHPASTE.    Please read over the following fact sheets that you were given.

## 2018-01-28 ENCOUNTER — Inpatient Hospital Stay (HOSPITAL_COMMUNITY)
Admission: RE | Admit: 2018-01-28 | Discharge: 2018-01-28 | Disposition: A | Discharge status: Home / Self Care | Payer: Medicare Other | Source: Ambulatory Visit

## 2018-01-28 ENCOUNTER — Other Ambulatory Visit: Payer: Self-pay | Admitting: *Deleted

## 2018-01-30 ENCOUNTER — Encounter (HOSPITAL_COMMUNITY): Admission: RE | Payer: Self-pay | Source: Home / Self Care

## 2018-01-30 ENCOUNTER — Inpatient Hospital Stay (HOSPITAL_COMMUNITY)
Admission: RE | Admit: 2018-01-30 | Payer: Medicare HMO | Source: Home / Self Care | Admitting: Thoracic Surgery (Cardiothoracic Vascular Surgery)

## 2018-01-30 SURGERY — VIDEO ASSISTED THORACOSCOPY
Anesthesia: General | Site: Chest | Laterality: Right

## 2018-02-11 ENCOUNTER — Ambulatory Visit (INDEPENDENT_AMBULATORY_CARE_PROVIDER_SITE_OTHER): Payer: Medicare HMO | Admitting: Pulmonary Disease

## 2018-02-11 ENCOUNTER — Encounter: Payer: Self-pay | Admitting: Pulmonary Disease

## 2018-02-11 VITALS — BP 138/74 | HR 78 | Ht 68.0 in | Wt 180.0 lb

## 2018-02-11 DIAGNOSIS — J849 Interstitial pulmonary disease, unspecified: Secondary | ICD-10-CM | POA: Diagnosis not present

## 2018-02-11 DIAGNOSIS — F17200 Nicotine dependence, unspecified, uncomplicated: Secondary | ICD-10-CM

## 2018-02-11 NOTE — Progress Notes (Signed)
[name]    [id number]    11-Jul-1950  Primary Care Physician:Mann, Carlean Jews, PA-C  Referring Physician: Cory Munch, PA-C [place], [place] [address]  Chief complaint:   Follow-up for emphysema, active smoker, ILD  HPI: 68 year old with history of emphysema, hemoptysis, GERD, active smoker hypertension, coronary artery disease, AAA.  He was previously followed by Dr. Ashok Cordia.  History noted for pulmonary emphysema with no obstruction on PFTs.  History of hemoptysis several years ago which has resolved, no parenchymal abnormalities on chest imaging.  He also has history of severe GERD status post esophageal dilatation in 2016 and 2018.  He continues on antiacid medication.  He was hospitalized in January 2019 for atypical chest pain.  Underwent left heart cath on 02/04/17 which was negative for any abnormality of his coronary arteries.  Pets: Has a dog, cat, chicken, [place], pheasent, peacock in his yard.  He has a parakeet inside his house. Occupation: Used to work Biomedical engineer.  Currently retired Exposures: Exposure to fumes and dust in his line of work.  No mold, hot tub at home Smoking history: 50-pack-year smoking history.  Continues to smoke half pack per day Travel History: Used to travel while working.  No significant travel recently.  Interim history: We advised him to get rid of the [facility].  He has not done so since he is attached to the pet however he is moved it to a different room and is trying to limit his exposure to it. Evaluated by Dr. 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  Continues to smoke.  Has chronic dyspnea on exertion, cough with white mucus production. We will for surgical lung biopsy by Dr. Roxan Hockey but he did not follow through.  States that he canceled as he was not feeling well with respiratory tract infection on that day.  Outpatient Encounter  Medications as of 02/11/2018  Medication Sig  . albuterol (PROAIR HFA) 108 (90 Base) MCG/ACT inhaler Inhale 2 puffs every 6 (six) hours as needed into the lungs. (Patient taking differently: Inhale 2 puffs into the lungs every 6 (six) hours as needed for wheezing or shortness of breath. )  . aspirin EC 81 MG tablet Take 81 mg by mouth daily.  . budesonide-formoterol (SYMBICORT) 160-4.5 MCG/ACT inhaler Inhale 2 puffs into the lungs 2 (two) times daily. (Patient taking differently: Inhale 2 puffs into the lungs 2 (two) times daily as needed (for shortness of breath or wheezing). )  . diclofenac sodium (VOLTAREN) 1 % GEL 3 grams to 3 large joints up to 3 times daily (Patient taking differently: Apply 1 application topically daily as needed (for joint pain). )  . esomeprazole (NEXIUM) 40 MG capsule Take 1 capsule (40 mg total) by mouth 2 (two) times daily before a meal.  . fluticasone (FLONASE) 50 MCG/ACT nasal spray SPRAY TWICE INTO EACH NOSTRIL EVERY DAY (Patient taking differently: Place 2 sprays into both nostrils daily as needed for allergies. )  . losartan-hydrochlorothiazide (HYZAAR) 100-12.5 MG tablet Take one-half tablet daily by mouth (Patient taking differently: Take 0.5 tablets by mouth daily as needed (SBP >130 or headache from high blood pressure). )  . meclizine (ANTIVERT) 25 MG tablet Take 1 tablet (25 mg total) by mouth 3 (three) times daily as needed for dizziness.  . Oxycodone HCl 20 MG TABS Take 20 mg by mouth every 4 (four) hours.   . Pediatric [place] (  KIDS GUMMY BEAR VITAMINS) CHEW Chew 2 each by mouth daily.   Marland Kitchen Spacer/Aero-Holding Chambers (AEROCHAMBER MV) inhaler Use as instructed   No facility-administered encounter medications on file as of 02/11/2018.    Physical Exam: Blood pressure 138/74, pulse 78, height 5\' 8"  (1.727 m), weight 180 lb (81.6 kg), SpO2 96 %. Gen:      No acute distress HEENT:  EOMI, sclera anicteric Neck:     No masses; no thyromegaly Lungs:     Clear to auscultation bilaterally; normal respiratory effort CV:         Regular rate and rhythm; no murmurs Abd:      + bowel sounds; soft, non-tender; no palpable masses, no distension Ext:    No edema; adequate peripheral perfusion Skin:      Warm and dry; no rash Neuro: alert and oriented x 3 Psych: normal mood and affect  Data Reviewed: Imaging: CT abdomen pelvis 07/20/10-lung images show subtle basilar atelectasis CT scan 10/13/14-dependent bibasilar atelectasis, left upper lobe 3 mm nodule CT scan 03/31/15- atelectasis, stable left upper lobe 3 mm nodule CT scan 10/18/15- atelectasis, stable left upper lobe 3 mm nodule. Chest x-ray 02/03/17-suspected mild interstitial lung disease in the lower lobes.  High res CT chest 06/04/17-bibasilar fibrosis with mild bronchiectasis.  No honeycombing.  Patchy tree-in-bud opacities in the right upper lobe.  Left main and coronary atheosclerosis High res CT chest 11/21/2017-progression of mild basilar predominant fibrotic lung disease, no honeycombing.  Probable UIP.  Two-vessel coronary atherosclerosis I have reviewed the images personally.  PFTs  12/31/14-FVC 3.44 [98%], FEV1 2.82 [85%], F/F 82, TLC 80%, DLCO 61% Moderate reduction in diffusion capacity.  No obstruction or restriction.  05/17/15-FVC 4.36 [82%], FEV1 3.28 [83%], F/F 75, DLCO 55% Normal spirometry.  Moderate reduction in diffusion capacity  06/12/17- FVC 4.29 [78%], FEV1 2.20 [83%), F/F 75, DLCO 54% Moderate diffusion defect  6MWT 12/31/14- 326 meters / Baseline Sat 96% on RA / Nadir Sat 96% on RA 09/20/17-336 m, baseline sat 98%, nadir sat 98% on room air  Labs 10/29/14 ALPHA-1 AT: MM (145)  Connective tissue serologies 06/05/2017-positive ANA, 1:320 titer, nucleolar pattern Hypersensitivity panel, CCP, Sjogren's panel, ANCA 06/05/2017-negative  Sputum culture 03/02/2017- AFB negative, fungus culture-light growth of yeast Normal oropharyngeal flora.  Assessment:  Evaluation  for interstitial lung disease CT scan reviewed lower lobe reticulation, scarring with no clear evidence of honeycombing This could be from hypersensitivity pneumonitis although the pattern of lower lobe predominance is unusual Other possibilities include chronic aspiration. He does have a parakeet at home which he is reluctant to get rid of, however he is avoiding significant exposure by moving into a different room. Connective tissue disease serology reviewed with positive ANA.   Differential diagnosis at this point includes chronic hypersensitivity pneumonitis, fibrotic NSIP from connective tissue disease, IPF. Since the treatment is different for each of these we will need a definitve diagnosis.  We had originally referred him to CT-guided biopsy.  Seen by Dr. Roxan Hockey but he canceled the procedure on the actual date due to respiratory tract infection.  He does not want to go ahead surgical lung biopsy at this point. Schedule him for bronchoscope with [place] classifier.  He is aware that this approach may have less a chance of definitive diagnosis but he is agreed to do this as a first step. Risks and benefits discussed with patient and he is agreed to proceed.  Emphysema PFTs do not show any obstruction.  Continues on Symbicort which  he is using intermittently Continues to smoke.  Encouraged smoking cessation.  Tree-in-bud opacities in the right upper lobe Likely nonspecific.  Sputum for AFB is negative.  Subcentimeter pulmonary nodule Has remained stable on follow-up imaging from 2016 to 2017.  This likely benign.   Follow-up on repeat imaging  Acid reflux, Barrett's esophagus Status post dilation by GI.  Continues on Nexium.    Chronic sinusitis, postnasal drip OTC chlorphentermine and Flonase nasal spray.  Health maintenance Declined vaccination  Plan/Recommendations: - Schedule bronchoscope - Continue Symbicort, smoking cessation  More then 1/2 the time of the 40 min  visit was spent in counseling and/or coordination of care with the patient and family.  Marshell Garfinkel MD [facility] 02/11/2018, 3:04 PM  CC: Cory Munch, PA-C

## 2018-02-11 NOTE — Patient Instructions (Signed)
Schedule you for a bronchoscope on 2/3, February Monday at [facility].  Please do not have anything to eat after Sunday midnight and arrive at [facility] 1 hour before procedure.  Somebody will give you a call to give further instructions Follow-up in 1 month.

## 2018-02-24 ENCOUNTER — Ambulatory Visit (HOSPITAL_COMMUNITY)
Admission: RE | Admit: 2018-02-24 | Discharge: 2018-02-24 | Disposition: A | Discharge status: Home / Self Care | Payer: Medicare HMO | Source: Ambulatory Visit | Attending: Pulmonary Disease | Admitting: Pulmonary Disease

## 2018-02-24 ENCOUNTER — Ambulatory Visit (HOSPITAL_COMMUNITY): Payer: Medicare HMO

## 2018-02-24 ENCOUNTER — Encounter (HOSPITAL_COMMUNITY)
Admission: RE | Disposition: A | Discharge status: Home / Self Care | Payer: Self-pay | Source: Home / Self Care | Attending: Pulmonary Disease

## 2018-02-24 ENCOUNTER — Ambulatory Visit (HOSPITAL_COMMUNITY)
Admission: RE | Admit: 2018-02-24 | Discharge: 2018-02-24 | Disposition: A | Discharge status: Home / Self Care | Payer: Medicare HMO | Attending: Pulmonary Disease | Admitting: Pulmonary Disease

## 2018-02-24 ENCOUNTER — Encounter (HOSPITAL_COMMUNITY): Payer: Self-pay | Admitting: Respiratory Therapy

## 2018-02-24 DIAGNOSIS — I2510 Atherosclerotic heart disease of native coronary artery without angina pectoris: Secondary | ICD-10-CM | POA: Insufficient documentation

## 2018-02-24 DIAGNOSIS — Z79899 Other long term (current) drug therapy: Secondary | ICD-10-CM | POA: Diagnosis not present

## 2018-02-24 DIAGNOSIS — Z7982 Long term (current) use of aspirin: Secondary | ICD-10-CM | POA: Diagnosis not present

## 2018-02-24 DIAGNOSIS — J439 Emphysema, unspecified: Secondary | ICD-10-CM | POA: Insufficient documentation

## 2018-02-24 DIAGNOSIS — Z7951 Long term (current) use of inhaled steroids: Secondary | ICD-10-CM | POA: Insufficient documentation

## 2018-02-24 DIAGNOSIS — F17200 Nicotine dependence, unspecified, uncomplicated: Secondary | ICD-10-CM | POA: Insufficient documentation

## 2018-02-24 DIAGNOSIS — R918 Other nonspecific abnormal finding of lung field: Secondary | ICD-10-CM | POA: Diagnosis not present

## 2018-02-24 DIAGNOSIS — J849 Interstitial pulmonary disease, unspecified: Secondary | ICD-10-CM

## 2018-02-24 DIAGNOSIS — R042 Hemoptysis: Secondary | ICD-10-CM | POA: Insufficient documentation

## 2018-02-24 DIAGNOSIS — K219 Gastro-esophageal reflux disease without esophagitis: Secondary | ICD-10-CM | POA: Insufficient documentation

## 2018-02-24 DIAGNOSIS — I10 Essential (primary) hypertension: Secondary | ICD-10-CM | POA: Diagnosis not present

## 2018-02-24 DIAGNOSIS — I714 Abdominal aortic aneurysm, without rupture: Secondary | ICD-10-CM | POA: Insufficient documentation

## 2018-02-24 DIAGNOSIS — Z9889 Other specified postprocedural states: Secondary | ICD-10-CM

## 2018-02-24 HISTORY — PX: VIDEO BRONCHOSCOPY: SHX5072

## 2018-02-24 LAB — BODY FLUID CELL COUNT WITH DIFFERENTIAL
Eos, Fluid: 20 %
Lymphs, Fluid: 10 %
Monocyte-Macrophage-Serous Fluid: 43 % — ABNORMAL LOW (ref 50–90)
Neutrophil Count, Fluid: 27 % — ABNORMAL HIGH (ref 0–25)
Total Nucleated Cell Count, Fluid: 365 cu mm (ref 0–1000)

## 2018-02-24 SURGERY — BRONCHOSCOPY, WITH FLUOROSCOPY
Anesthesia: Moderate Sedation | Laterality: Bilateral

## 2018-02-24 MED ORDER — SODIUM CHLORIDE 0.9 % IV SOLN
INTRAVENOUS | Status: DC
  Administered 2018-02-24: 09:00:00 via INTRAVENOUS

## 2018-02-24 MED ORDER — PHENYLEPHRINE HCL 0.25 % NA SOLN
NASAL | Status: DC | PRN
  Administered 2018-02-24: 2 via NASAL

## 2018-02-24 MED ORDER — FENTANYL CITRATE (PF) 100 MCG/2ML IJ SOLN
INTRAMUSCULAR | Status: DC | PRN
  Administered 2018-02-24 (×6): 25 ug via INTRAVENOUS

## 2018-02-24 MED ORDER — LIDOCAINE HCL URETHRAL/MUCOSAL 2 % EX GEL
CUTANEOUS | Status: DC | PRN
  Administered 2018-02-24: 1

## 2018-02-24 MED ORDER — PHENYLEPHRINE HCL 0.25 % NA SOLN: 1.0000 | Freq: Four times a day (QID) | NASAL | Status: DC | PRN

## 2018-02-24 MED ORDER — FENTANYL CITRATE (PF) 100 MCG/2ML IJ SOLN
INTRAMUSCULAR | Status: AC
  Filled 2018-02-24: qty 4

## 2018-02-24 MED ORDER — LIDOCAINE HCL 2 % EX GEL: 1.0000 "application " | Freq: Once | CUTANEOUS | Status: DC

## 2018-02-24 MED ORDER — MIDAZOLAM HCL (PF) 5 MG/ML IJ SOLN
INTRAMUSCULAR | Status: AC
  Filled 2018-02-24: qty 2

## 2018-02-24 MED ORDER — LIDOCAINE HCL 1 % IJ SOLN
INTRAMUSCULAR | Status: DC | PRN
  Administered 2018-02-24: 6 mL via RESPIRATORY_TRACT

## 2018-02-24 MED ORDER — MIDAZOLAM HCL (PF) 10 MG/2ML IJ SOLN
INTRAMUSCULAR | Status: DC | PRN
  Administered 2018-02-24 (×6): 1 mg via INTRAVENOUS

## 2018-02-24 MED ORDER — BUTAMBEN-TETRACAINE-BENZOCAINE 2-2-14 % EX AERO: 1.0000 | INHALATION_SPRAY | Freq: Once | CUTANEOUS | Status: DC

## 2018-02-24 NOTE — Progress Notes (Signed)
Video Bronchoscopy done  Intervention Bronchial washing done Intervention Bronchial biopsy done  Procedure tolerated well 

## 2018-02-24 NOTE — Discharge Instructions (Signed)
Flexible Bronchoscopy, Care After This sheet gives you information about how to care for yourself after your test. Your doctor may also give you more specific instructions. If you have problems or questions, contact your doctor. Follow these instructions at home: Eating and drinking  Do not eat or drink anything (not even water) for 2 hours after your test, or until your numbing medicine (local anesthetic) wears off.  When your numbness is gone and your cough and gag reflexes have come back, you may: ? Eat only soft foods. ? Slowly drink liquids.  The day after the test, go back to your normal diet. Driving  Do not drive for 24 hours if you were given a medicine to help you relax (sedative).  Do not drive or use heavy machinery while taking prescription pain medicine. General instructions   Take over-the-counter and prescription medicines only as told by your doctor.  Return to your normal activities as told. Ask what activities are safe for you.  Do not use any products that have nicotine or tobacco in them. This includes cigarettes and e-cigarettes. If you need help quitting, ask your doctor.  Keep all follow-up visits as told by your doctor. This is important. It is very important if you had a tissue sample (biopsy) taken. Get help right away if:  You have shortness of breath that gets worse.  You get light-headed.  You feel like you are going to pass out (faint).  You have chest pain.  You cough up: ? More than a little blood. ? More blood than before. Summary  Do not eat or drink anything (not even water) for 2 hours after your test, or until your numbing medicine wears off.  Do not use cigarettes. Do not use e-cigarettes.  Get help right away if you have chest pain. This information is not intended to replace advice given to you by your health care provider. Make sure you discuss any questions you have with your health care provider. Document Released: 11/05/2008  Document Revised: 01/27/2016 Document Reviewed: 01/27/2016 Elsevier Interactive Patient Education  2019 [employer].  Nothing to eat or drink until   11:45    am today  02/24/2018 Any questions or  concerns please call the office at [phone]

## 2018-02-24 NOTE — Op Note (Signed)
[facility] Cardiopulmonary Patient Name: [name] Date: 02/24/2018 MRN: [id number] Attending MD: Marshell Garfinkel , MD Date of Birth: 1950/05/17 CSN: Finalized Age: 68 Admit Type: Outpatient Gender: Male Procedure:            Bronchoscopy Indications:          Interstitial lung disease Providers:            Marshell Garfinkel, MD, Andre Lefort RRT,RCP, Christin Fudge Referring MD:          Medicines:            Midazolam 6 mg IV, Fentanyl 027 mcg IV Complications:        No immediate complications Estimated Blood Loss: Estimated blood loss: none. Procedure:            Pre-Anesthesia Assessment:                       - A History and Physical has been performed. Patient                        meds and allergies have been reviewed. The risks and                        benefits of the procedure and the sedation options and                        risks were discussed with the patient. All questions                        were answered and informed consent was obtained.                        Patient identification and proposed procedure were                        verified prior to the procedure by the physician in the                        procedure room. Mental Status Examination: alert and                        oriented. Airway Examination: normal oropharyngeal                        airway. Respiratory Examination: clear to auscultation.                        CV Examination: RRR, no murmurs, no S3 or S4. ASA Grade                        Assessment: II - A patient with mild systemic disease.                        After reviewing the risks and benefits, the patient was                        deemed in satisfactory condition to undergo the  procedure. The anesthesia plan was to use moderate                        sedation / analgesia (conscious sedation). Immediately                        prior to  administration of medications, the patient was                        re-assessed for adequacy to receive sedatives. The                        heart rate, respiratory rate, oxygen saturations, blood                        pressure, adequacy of pulmonary ventilation, and                        response to care were monitored throughout the                        procedure. The physical status of the patient was                        re-assessed after the procedure.                       After obtaining informed consent, the bronchoscope was                        passed under direct vision. Throughout the procedure,                        the patient's blood pressure, pulse, and oxygen                        saturations were monitored continuously. the BF-H190                        ([id number]) Olympus Diagnostic Bronchoscope was                        introduced through the right nostril and advanced to                        the tracheobronchial tree of both lungs. Scope In: 9:29:44 AM Scope Out: 9:44:55 AM Findings:      Bronchoalveolar lavage was performed in the RML lateral segment (B4) of       the lung and sent for cell count, bacterial culture, viral smears &       culture, and fungal & AFB analysis and cytology. 180 mL of fluid were       instilled. 100 mL were returned. The return was cloudy. There were no       mucoid plugs in the return fluid. Multiple specimens were obtained and       pooled into one specimen, which was sent for analysis.      Transbronchial biopsies were performed in the posterior segment of the       right upper lobe, in the anterior segment of the right upper lobe, in  the lateral basal segment of the right lower lobe and in the posterior       basal segment of the right lower lobe using alligator forceps and sent       for [facility] classifier. The procedure was guided by fluoroscopy.       Transbronchial biopsy technique was selected because the  sampling site       was not visible endoscopically. Five biopsy passes were performed. Five       biopsy samples were obtained (3 from RLL and 2 from RUL). Impression:           - Interstitial lung disease                       - Bronchoalveolar lavage was performed.                       - Transbronchial lung biopsies were performed. Moderate Sedation:      Moderate (conscious) sedation was administered by the endoscopy nurse       and supervised by the endoscopist. The following parameters were       monitored: oxygen saturation, heart rate, blood pressure, respiratory       rate, EKG, adequacy of pulmonary ventilation, and response to care.       Total physician intraservice time was 25 minutes. Recommendation:       - Await BAL and biopsy results. Procedure Code(s):    --- Professional ---                       [id number], Bronchoscopy, rigid or flexible, including                        fluoroscopic guidance, when performed; with                        transbronchial lung biopsy(s), single lobe                       01093, Bronchoscopy, rigid or flexible, including                        fluoroscopic guidance, when performed; with bronchial                        alveolar lavage                       [phone], Bronchoscopy, rigid or flexible, including                        fluoroscopic guidance, when performed; with                        transbronchial lung biopsy(s), each additional lobe                        (List separately in addition to code for primary                        procedure)                       99152, Moderate sedation services provided by the same  physician or other qualified health care professional                        performing the diagnostic or therapeutic service that                        the sedation supports, requiring the presence of an                        independent trained observer to assist in the                         monitoring of the patient's level of consciousness and                        physiological status; initial 15 minutes of                        intraservice time, patient age 48 years or older                       [phone], Moderate sedation; each additional 15 minutes                        intraservice time Diagnosis Code(s):    --- Professional ---                       J84.9, Interstitial pulmonary disease, unspecified CPT copyright 2018 American Medical Association. All rights reserved. The codes documented in this report are preliminary and upon coder review may  be revised to meet current compliance requirements. Marshell Garfinkel, MD 02/24/2018 10:03:30 AM Number of Addenda: 0

## 2018-02-24 NOTE — H&P (Signed)
[name]    [id number]    04-23-1950  Primary Care Physician:Mann, Carlean Jews, PA-C  Referring Physician: No referring provider defined for this encounter.  Chief complaint:   Follow-up for emphysema, active smoker, ILD  HPI: 68 year old with history of emphysema, hemoptysis, GERD, active smoker hypertension, coronary artery disease, AAA.  He was previously followed by Dr. Ashok Cordia.  History noted for pulmonary emphysema with no obstruction on PFTs.  History of hemoptysis several years ago which has resolved, no parenchymal abnormalities on chest imaging.  He also has history of severe GERD status post esophageal dilatation in 2016 and 2018.  He continues on antiacid medication.  He was hospitalized in January 2019 for atypical chest pain.  Underwent left heart cath on 02/04/17 which was negative for any abnormality of his coronary arteries.  Pets: Has a dog, cat, chicken, [place], pheasent, peacock in his yard.  He has a parakeet inside his house. Occupation: Used to work Biomedical engineer.  Currently retired Exposures: Exposure to fumes and dust in his line of work.  No mold, hot tub at home Smoking history: 50-pack-year smoking history.  Continues to smoke half pack per day Travel History: Used to travel while working.  No significant travel recently.  Interim history: Here for bronchoscopy. States that breathing is stable. No new complaints  Outpatient Encounter Medications as of 02/11/2018  Medication Sig  . albuterol (PROAIR HFA) 108 (90 Base) MCG/ACT inhaler Inhale 2 puffs every 6 (six) hours as needed into the lungs. (Patient taking differently: Inhale 2 puffs into the lungs every 6 (six) hours as needed for wheezing or shortness of breath. )  . aspirin EC 81 MG tablet Take 81 mg by mouth daily.  . budesonide-formoterol (SYMBICORT) 160-4.5 MCG/ACT inhaler Inhale 2 puffs into the lungs 2 (two) times daily. (Patient taking differently: Inhale 2 puffs into the  lungs 2 (two) times daily as needed (for shortness of breath or wheezing). )  . diclofenac sodium (VOLTAREN) 1 % GEL 3 grams to 3 large joints up to 3 times daily (Patient taking differently: Apply 1 application topically daily as needed (for joint pain). )  . esomeprazole (NEXIUM) 40 MG capsule Take 1 capsule (40 mg total) by mouth 2 (two) times daily before a meal.  . fluticasone (FLONASE) 50 MCG/ACT nasal spray SPRAY TWICE INTO EACH NOSTRIL EVERY DAY (Patient taking differently: Place 2 sprays into both nostrils daily as needed for allergies. )  . losartan-hydrochlorothiazide (HYZAAR) 100-12.5 MG tablet Take one-half tablet daily by mouth (Patient taking differently: Take 0.5 tablets by mouth daily as needed (SBP >130 or headache from high blood pressure). )  . meclizine (ANTIVERT) 25 MG tablet Take 1 tablet (25 mg total) by mouth 3 (three) times daily as needed for dizziness.  . Oxycodone HCl 20 MG TABS Take 20 mg by mouth every 4 (four) hours.   . Pediatric Multivit-Minerals-C (KIDS GUMMY BEAR VITAMINS) CHEW Chew 2 each by mouth daily.   Marland Kitchen Spacer/Aero-Holding Chambers (AEROCHAMBER MV) inhaler Use as instructed   No facility-administered encounter medications on file as of 02/11/2018.    Physical Exam: Blood pressure (!) 146/86, pulse 72, temperature 98.2 F (36.8 C), temperature source Oral, resp. rate 10, SpO2 100 %. Gen:      No acute distress HEENT:  EOMI, sclera anicteric Neck:     No masses; no thyromegaly Lungs:    Clear to auscultation bilaterally; normal respiratory effort CV:  Regular rate and rhythm; no murmurs Abd:      + bowel sounds; soft, non-tender; no palpable masses, no distension Ext:    No edema; adequate peripheral perfusion Skin:      Warm and dry; no rash Neuro: alert and oriented x 3 Psych: normal mood and affect  Data Reviewed: Imaging: CT abdomen pelvis 07/20/10-lung images show subtle basilar atelectasis CT scan 10/13/14-dependent bibasilar atelectasis,  left upper lobe 3 mm nodule CT scan 03/31/15- atelectasis, stable left upper lobe 3 mm nodule CT scan 10/18/15- atelectasis, stable left upper lobe 3 mm nodule. Chest x-ray 02/03/17-suspected mild interstitial lung disease in the lower lobes.  High res CT chest 06/04/17-bibasilar fibrosis with mild bronchiectasis.  No honeycombing.  Patchy tree-in-bud opacities in the right upper lobe.  Left main and coronary atheosclerosis High res CT chest 11/21/2017-progression of mild basilar predominant fibrotic lung disease, no honeycombing.  Probable UIP.  Two-vessel coronary atherosclerosis I have reviewed the images personally.  PFTs  12/31/14-FVC 3.44 [98%], FEV1 2.82 [85%], F/F 82, TLC 80%, DLCO 61% Moderate reduction in diffusion capacity.  No obstruction or restriction.  05/17/15-FVC 4.36 [82%], FEV1 3.28 [83%], F/F 75, DLCO 55% Normal spirometry.  Moderate reduction in diffusion capacity  06/12/17- FVC 4.29 [78%], FEV1 2.20 [83%), F/F 75, DLCO 54% Moderate diffusion defect  6MWT 12/31/14- 326 meters / Baseline Sat 96% on RA / Nadir Sat 96% on RA 09/20/17-336 m, baseline sat 98%, nadir sat 98% on room air  Labs 10/29/14 ALPHA-1 AT: MM (145)  Connective tissue serologies 06/05/2017-positive ANA, 1:320 titer, nucleolar pattern Hypersensitivity panel, CCP, Sjogren's panel, ANCA 06/05/2017-negative  Sputum culture 03/02/2017- AFB negative, fungus culture-light growth of yeast Normal oropharyngeal flora.  Assessment:  Evaluation for interstitial lung disease CT scan reviewed lower lobe reticulation, scarring with no clear evidence of honeycombing This could be from hypersensitivity pneumonitis although the pattern of lower lobe predominance is unusual Other possibilities include chronic aspiration. He does have a parakeet at home which he is reluctant to get rid of, however he is avoiding significant exposure by moving into a different room. Connective tissue disease serology reviewed with positive  ANA.   Differential diagnosis at this point includes chronic hypersensitivity pneumonitis, fibrotic NSIP from connective tissue disease, IPF. Since the treatment is different for each of these we will need a definitve diagnosis.  Plan for bronch today with BAL and biopsy for [facility] classifier.   Risk benefit discussed with patient and consent obtained  Marshell Garfinkel MD [facility] 02/24/2018, 9:54 AM  CC: No ref. provider found

## 2018-02-24 NOTE — Progress Notes (Signed)
Patient in for Bronchoscopy today.  Toward the end of recovery pt refused to stay any longer.  Pt. Became combative and verbally abuse; He was swinging at the staff. Patient refused to use wheel chair to be taken to the waiting room. Patient walked down hallway to waiting room,  escorted by staff, to meet his wife , discharge instructions to her.  Security called to assist with patient.  Staff walked with family and pt to car to assure pt would not be driving.

## 2018-02-25 ENCOUNTER — Encounter (HOSPITAL_COMMUNITY): Payer: Self-pay | Admitting: Pulmonary Disease

## 2018-02-25 LAB — ACID FAST SMEAR (AFB, MYCOBACTERIA): Acid Fast Smear: NEGATIVE

## 2018-02-27 ENCOUNTER — Telehealth: Payer: Self-pay | Admitting: Pulmonary Disease

## 2018-02-27 MED ORDER — AMOXICILLIN-POT CLAVULANATE 875-125 MG PO TABS: 1.0000 | ORAL_TABLET | Freq: Two times a day (BID) | ORAL | 0 refills | Status: DC

## 2018-02-27 NOTE — Telephone Encounter (Signed)
Notes recorded by Marshell Garfinkel, MD on 02/27/2018 at 11:31 AM EST Please let patient know bronch cultures are growing bacteria Call in Augmentin 875 mg bid for 7days  lmtcb x1 for pt.

## 2018-02-27 NOTE — Telephone Encounter (Signed)
Pt is calling back [phone]

## 2018-02-27 NOTE — Telephone Encounter (Signed)
Pt is aware of results and voiced his understanding.  Rx for Augmentin 875mg  #14 has been sent to preferred pharmacy. Nothing further is needed.

## 2018-02-28 LAB — CULTURE, BAL-QUANTITATIVE W GRAM STAIN
Culture: 50000 — AB
Special Requests: NORMAL

## 2018-03-05 DIAGNOSIS — J84111 Idiopathic interstitial pneumonia, not otherwise specified: Secondary | ICD-10-CM | POA: Diagnosis not present

## 2018-03-14 ENCOUNTER — Encounter: Payer: Self-pay | Admitting: Pulmonary Disease

## 2018-03-14 ENCOUNTER — Ambulatory Visit (INDEPENDENT_AMBULATORY_CARE_PROVIDER_SITE_OTHER): Payer: Medicare HMO | Admitting: Pulmonary Disease

## 2018-03-14 VITALS — BP 136/78 | HR 84 | Ht 68.0 in | Wt 190.2 lb

## 2018-03-14 DIAGNOSIS — J849 Interstitial pulmonary disease, unspecified: Secondary | ICD-10-CM | POA: Diagnosis not present

## 2018-03-14 NOTE — Patient Instructions (Signed)
Unfortunately the lung biopsy has not given [place] a clear answer of why you have the pulmonary scarring I would encourage you to consider to surgical lung biopsy as of yet originally planned with Dr. Roxan Hockey I will discuss your case conference with other doctors Follow-up in 2 to 4 weeks

## 2018-03-14 NOTE — Progress Notes (Signed)
[name]    [id number]    Mar 14, 1950  Primary Care Physician:Mann, Carlean Jews, PA-C  Referring Physician: Cory Munch, PA-C [place], [place] [address]  Chief complaint:   Follow-up for emphysema, active smoker, ILD  HPI: 68 year old with history of emphysema, hemoptysis, GERD, active smoker hypertension, coronary artery disease, AAA.  He was previously followed by Dr. Ashok Cordia.  History noted for pulmonary emphysema with no obstruction on PFTs.  History of hemoptysis several years ago which has resolved, no parenchymal abnormalities on chest imaging.  He also has history of severe GERD status post esophageal dilatation in 2016 and 2018.  He continues on antiacid medication.  He was hospitalized in January 2019 for atypical chest pain.  Underwent left heart cath on 02/04/17 which was negative for any abnormality of his coronary arteries.  Pets: Has a dog, cat, chicken, [place], pheasent, peacock in his yard.  He has a parakeet inside his house. Occupation: Used to work Biomedical engineer.  Currently retired Exposures: Exposure to fumes and dust in his line of work.  No mold, hot tub at home Smoking history: 50-pack-year smoking history.  Continues to smoke half pack per day Travel History: Used to travel while working.  No significant travel recently.  Interim history: We advised him to get rid of the [facility].  He has not done so since he is attached to the pet however he is moved it to a different room and is trying to limit his exposure to it. Evaluated by Dr. 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  Underwent bronchoscope with BAL showing pneumococcus and Moraxella.  He is completed a course of Augmentin for this Has chronic dyspnea on exertion, cough with white mucus production.  Outpatient Encounter Medications as of 03/14/2018  Medication Sig  . albuterol (PROAIR HFA) 108 (90 Base)  MCG/ACT inhaler Inhale 2 puffs every 6 (six) hours as needed into the lungs. (Patient taking differently: Inhale 2 puffs into the lungs every 6 (six) hours as needed for wheezing or shortness of breath. )  . aspirin EC 81 MG tablet Take 81 mg by mouth daily.  . budesonide-formoterol (SYMBICORT) 160-4.5 MCG/ACT inhaler Inhale 2 puffs into the lungs 2 (two) times daily. (Patient taking differently: Inhale 2 puffs into the lungs 2 (two) times daily as needed (for shortness of breath or wheezing). )  . diclofenac sodium (VOLTAREN) 1 % GEL 3 grams to 3 large joints up to 3 times daily (Patient taking differently: Apply 1 application topically daily as needed (for joint pain). )  . esomeprazole (NEXIUM) 40 MG capsule Take 1 capsule (40 mg total) by mouth 2 (two) times daily before a meal.  . fluticasone (FLONASE) 50 MCG/ACT nasal spray SPRAY TWICE INTO EACH NOSTRIL EVERY DAY (Patient taking differently: Place 2 sprays into both nostrils daily as needed for allergies. )  . losartan-hydrochlorothiazide (HYZAAR) 100-12.5 MG tablet Take one-half tablet daily by mouth (Patient taking differently: Take 0.5 tablets by mouth daily as needed (SBP >130 or headache from high blood pressure). )  . meclizine (ANTIVERT) 25 MG tablet Take 1 tablet (25 mg total) by mouth 3 (three) times daily as needed for dizziness.  . Oxycodone HCl 20 MG TABS Take 20 mg by mouth every 4 (four) hours.   . Pediatric Multivit-Minerals-C (KIDS GUMMY BEAR VITAMINS) CHEW Chew 2 each by mouth daily.   Marland Kitchen Spacer/Aero-Holding Chambers (AEROCHAMBER MV) inhaler  Use as instructed  . [DISCONTINUED] amoxicillin-clavulanate (AUGMENTIN) 875-125 MG tablet Take 1 tablet by mouth 2 (two) times daily.   No facility-administered encounter medications on file as of 03/14/2018.    Physical Exam: Blood pressure 136/78, pulse 84, height _0  (1.727 m), weight 190 lb 3.2 oz (86.3 kg), SpO2 95 %. Gen:      No acute distress HEENT:  EOMI, sclera anicteric Neck:      No masses; no thyromegaly Lungs:    Clear to auscultation bilaterally; normal respiratory effort CV:         Regular rate and rhythm; no murmurs Abd:      + bowel sounds; soft, non-tender; no palpable masses, no distension Ext:    No edema; adequate peripheral perfusion Skin:      Warm and dry; no rash Neuro: alert and oriented x 3 Psych: normal mood and affect  Data Reviewed: Imaging: CT abdomen pelvis 07/20/10-lung images show subtle basilar atelectasis CT scan 10/13/14-dependent bibasilar atelectasis, left upper lobe 3 mm nodule CT scan 03/31/15- atelectasis, stable left upper lobe 3 mm nodule CT scan 10/18/15- atelectasis, stable left upper lobe 3 mm nodule. Chest x-ray 02/03/17-suspected mild interstitial lung disease in the lower lobes.  High res CT chest 06/04/17-bibasilar fibrosis with mild bronchiectasis.  No honeycombing.  Patchy tree-in-bud opacities in the right upper lobe.  Left main and coronary atheosclerosis High res CT chest 11/21/2017-progression of mild basilar predominant fibrotic lung disease, no honeycombing.  Probable UIP.  Two-vessel coronary atherosclerosis I have reviewed the images personally.  PFTs  12/31/14-FVC 3.44 [98%], FEV1 2.82 [85%], F/F 82, TLC 80%, DLCO 61% Moderate reduction in diffusion capacity.  No obstruction or restriction.  05/17/15-FVC 4.36 [82%], FEV1 3.28 [83%], F/F 75, DLCO 55% Normal spirometry.  Moderate reduction in diffusion capacity  06/12/17- FVC 4.29 [78%], FEV1 2.20 [83%), F/F 75, DLCO 54% Moderate diffusion defect  6MWT 12/31/14- 326 meters / Baseline Sat 96% on RA / Nadir Sat 96% on RA 09/20/17-336 m, baseline sat 98%, nadir sat 98% on room air  Labs 10/29/14 ALPHA-1 AT: MM (145)  Connective tissue serologies 06/05/2017-positive ANA, 1:320 titer, nucleolar pattern Hypersensitivity panel, CCP, Sjogren's panel, ANCA 06/05/2017-negative  Sputum culture 03/02/2017- AFB negative, fungus culture-light growth of yeast Normal  oropharyngeal flora.  Assessment:  Evaluation for interstitial lung disease CT scan reviewed lower lobe reticulation, scarring with no clear evidence of honeycombing This could be from hypersensitivity pneumonitis although the pattern of lower lobe predominance is unusual. Other possibilities include chronic aspiration. He does have a parakeet at home which he is reluctant to get rid of, however he is avoiding significant exposure by moving into a different room. Connective tissue disease serology reviewed with positive ANA.   Differential diagnosis at this point includes chronic hypersensitivity pneumonitis, fibrotic NSIP from connective tissue disease, IPF. Since the treatment is different for each of these we will need a definitve diagnosis.  He has been reluctant to get a surgical lung biopsy.  Bronchoscope with Clabe Seal classifier is negative for UIP fibrosis.  Since we still do not have a clear diagnosis I have encouraged him to go ahead with the lung biopsy.  Will discuss at multidisciplinary ILD conference.  Emphysema PFTs do not show any obstruction.  Continues on Symbicort which he is using intermittently Continues to smoke.  Encouraged smoking cessation.  Subcentimeter pulmonary nodule Has remained stable on follow-up imaging from 2016 to 2017.  This likely benign.   Follow-up on repeat imaging  Acid reflux,  Barrett's esophagus Status post dilation by GI.  Continues on Nexium.    Chronic sinusitis, postnasal drip OTC chlorphentermine and Flonase nasal spray.  Health maintenance Declined vaccination  Plan/Recommendations: - MCD ILD conference discussion - Continue Symbicort, smoking cessation  Marshell Garfinkel MD [facility] 03/14/2018, 3:33 PM  CC: Cory Munch, PA-C

## 2018-03-20 NOTE — Progress Notes (Signed)
Office Visit Note  Patient: [name]             Date of Birth: 1950/04/06           MRN: [id number]             PCP: Ginger Organ Referring: Ginger Organ Visit Date: 04/03/2018 Occupation: @GUAROCC @  Subjective:  Right knee pain.   History of Present Illness: CAIDIN HEIDENREICH is a 69 y.o. male with history of positive ANA, idiopathic pulmonary fibrosis and osteoarthritis.  He states he has been having pain and discomfort in his right knee joint.  He underwent lung biopsy through bronchoscopy.  I reviewed his pulmonologist notes which he states that the etiology will be discussed at the ILD conference.  No diagnosis has been established at this point.  He denies any joint swelling.  Activities of Daily Living:  Patient reports morning stiffness for 5 minutes.   Patient Reports nocturnal pain.  Difficulty dressing/grooming: Denies Difficulty climbing stairs: Denies Difficulty getting out of chair: Denies Difficulty using hands for taps, buttons, cutlery, and/or writing: Denies  Review of Systems  Constitutional: Positive for fatigue. Negative for night sweats.  HENT: Positive for mouth dryness. Negative for mouth sores and nose dryness.   Eyes: Negative for redness, itching and dryness.  Respiratory: Positive for shortness of breath. Negative for wheezing and difficulty breathing.   Cardiovascular: Negative for chest pain, palpitations, hypertension, irregular heartbeat and swelling in legs/feet.  Gastrointestinal: Positive for heartburn. Negative for constipation and diarrhea.  Endocrine: Negative for increased urination.  Genitourinary: Negative for painful urination.  Musculoskeletal: Positive for arthralgias, joint pain and morning stiffness. Negative for joint swelling, myalgias, muscle weakness, muscle tenderness and myalgias.  Skin: Negative for color change, rash, hair loss, nodules/bumps, skin tightness, ulcers and sensitivity to sunlight.   Allergic/Immunologic: Negative for susceptible to infections.  Neurological: Positive for numbness. Negative for dizziness, fainting, headaches, memory loss, night sweats and weakness ( ).  Hematological: Negative for bruising/bleeding tendency and swollen glands.  Psychiatric/Behavioral: Negative for depressed mood, confusion and sleep disturbance. The patient is not nervous/anxious.     PMFS History:  Patient Active Problem List   Diagnosis Date Noted  . ILD (interstitial lung disease) ([place])   . Primary osteoarthritis of both hands 11/22/2017  . Primary osteoarthritis of right knee 11/22/2017  . DDD (degenerative disc disease), cervical 11/22/2017  . Atypical chest pain 02/03/2017  . Acute URI 12/05/2016  . Cough 11/09/2015  . Asthmatic bronchitis with acute exacerbation 08/03/2015  . Tobacco use disorder 05/17/2015  . Emphysema of lung ([place]) 10/29/2014  . CKD (chronic kidney disease), stage II   . Pulmonary nodule   . Dysphagia 05/07/2013  . GERD (gastroesophageal reflux disease) 05/07/2013  . Chest pain 09/06/2012  . Abdominal aneurysm without mention of rupture 10/20/2010  . Preop cardiovascular exam 06/01/2010  . Cigarette smoker 06/01/2010  . AAA (abdominal aortic aneurysm) ([place]) 06/01/2010  . HTN (hypertension) 06/01/2010    Past Medical History:  Diagnosis Date  . AAA (abdominal aortic aneurysm) ([place])    a. s/p EVAR 05/2010.  . Back pain   . CKD (chronic kidney disease), stage II   . Degenerative disk disease    a. Chronic back pain with ridiculopathy.  . Diverticulosis   . Emphysema of lung ([place])   . Essential hypertension   . GERD (gastroesophageal reflux disease)   . Hiatal hernia   . Hyperlipidemia   . Neck rigidity  from cervical fusion- patient cannot turn head  . Pulmonary nodule    a. 54mm nodule seen on CT 09/2014 - f/u recommended 1 yr.  . Skin cancer   . Tobacco abuse     Family History  Problem Relation Age of Onset  . Atrial fibrillation  Mother   . Heart disease Mother        Patient unclear of what kind  . Deep vein thrombosis Mother   . Hyperlipidemia Mother   . Hypertension Mother   . Stroke Father   . Diabetes Father   . Heart disease Father        Patient unclear of what kind  . Kidney disease Father   . Hyperlipidemia Father   . Hypertension Father   . Diabetes Sister   . Heart disease Sister        Patient unclear of what kind  . Brain cancer Sister   . Seizures Sister   . Deep vein thrombosis Other   . Pulmonary embolism Other   . Cancer Paternal Grandmother   . Lung disease Neg Hx   . Colon cancer Neg Hx   . Esophageal cancer Neg Hx   . Rectal cancer Neg Hx   . Stomach cancer Neg Hx    Past Surgical History:  Procedure Laterality Date  . ABDOMINAL AORTIC ENDOVASCULAR STENT GRAFT  2014  . APPENDECTOMY    . BACK SURGERY    . BIOPSY N/A 06/10/2013   Procedure: BIOPSY;  Surgeon: Rogene Houston, MD;  Location: [facility];  Service: Endoscopy;  Laterality: N/A;  . BIOPSY  12/03/2014   Procedure: BIOPSY (Gastroesophageal Junction);  Surgeon: Rogene Houston, MD;  Location: [facility];  Service: Endoscopy;;  . ESOPHAGEAL DILATION N/A 12/03/2014   Procedure: ESOPHAGEAL DILATION WITH 54FR AND 56FR MALONEY;  Surgeon: Rogene Houston, MD;  Location: [facility];  Service: Endoscopy;  Laterality: N/A;  . ESOPHAGOGASTRODUODENOSCOPY (EGD) WITH PROPOFOL N/A 06/10/2013   Procedure: ESOPHAGOGASTRODUODENOSCOPY (EGD) WITH PROPOFOL;  Surgeon: Rogene Houston, MD;  Location: [facility];  Service: Endoscopy;  Laterality: N/A;  . ESOPHAGOGASTRODUODENOSCOPY (EGD) WITH PROPOFOL N/A 12/03/2014   Procedure: ESOPHAGOGASTRODUODENOSCOPY (EGD) WITH PROPOFOL;  Surgeon: Rogene Houston, MD;  Location: [facility];  Service: Endoscopy;  Laterality: N/A;  . EVAR  06/20/10   for AAA repair   . LEFT HEART CATH AND CORONARY ANGIOGRAPHY N/A 02/04/2017   Procedure: LEFT HEART CATH AND CORONARY ANGIOGRAPHY;  Surgeon: Lorretta Harp, MD;  Location: [facility];  Service: Cardiovascular;  Laterality: N/A;  . MALONEY DILATION N/A 06/10/2013   Procedure: MALONEY DILATION 52 fr, 33 fr;  Surgeon: Rogene Houston, MD;  Location: [facility];  Service: Endoscopy;  Laterality: N/A;  . SPINAL FUSION     of neck and lower back  . VIDEO BRONCHOSCOPY Bilateral 02/24/2018   Procedure: VIDEO BRONCHOSCOPY WITH FLUORO;  Surgeon: Marshell Garfinkel, MD;  Location: [facility];  Service: Cardiopulmonary;  Laterality: Bilateral;   Social History   Social History Narrative   Originally from [place], [place]. Always lived in [place]. Travel to the [place] in March 2016. Has traveled to [place], [place], [place], & [place]. 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 No known asbestos exposure. Has 2 parakeets currently. Remotely raised parakeets, love birds, & cockatiels in a different house. No known mold or hot tub exposure.    There is no immunization  history for the selected administration types on file for this patient.   Objective: Vital Signs: BP 128/73 (BP Location: Left Arm, Patient Position: Sitting, Cuff Size: Normal)   Pulse 73   Resp 14   Ht 5\' 9"  (1.753 m)   Wt 188 lb (85.3 kg)   BMI 27.76 kg/m    Physical Exam Vitals signs and nursing note reviewed.  Constitutional:      Appearance: He is well-developed.  HENT:     Head: Normocephalic and atraumatic.  Eyes:     Conjunctiva/sclera: Conjunctivae normal.     Pupils: Pupils are equal, round, and reactive to light.  Neck:     Musculoskeletal: Normal range of motion and neck supple.  Cardiovascular:     Rate and Rhythm: Normal rate and regular rhythm.     Heart sounds: Normal heart sounds.  Pulmonary:     Effort: Pulmonary effort is normal.     Breath sounds: Rales present.     Comments: bilateral lung bases Abdominal:     General: Bowel sounds are normal.     Palpations: Abdomen is soft.  Skin:    General: Skin is warm  and dry.     Capillary Refill: Capillary refill takes less than 2 seconds.  Neurological:     Mental Status: He is alert and oriented to person, place, and time.  Psychiatric:        Behavior: Behavior normal.      Musculoskeletal Exam: C-spine very limited range of motion.  Shoulder joints elbow joints wrist joints with good range of motion.  He has DIP and PIP thickening in bilateral hands consistent with osteoarthritis.  He has warmth and swelling in his right knee joint.  Left knee joint was in good range of motion without any discomfort.  CDAI Exam: CDAI Score: Not documented Patient Global Assessment: Not documented; Provider Global Assessment: Not documented Swollen: Not documented; Tender: Not documented Joint Exam   Not documented   There is currently no information documented on the homunculus. Go to the Rheumatology activity and complete the homunculus joint exam.  Investigation: No additional findings.  Imaging: No results found.  Recent Labs: Lab Results  Component Value Date   WBC 11.3 (H) 06/05/2017   HGB 13.5 06/05/2017   PLT 342.0 06/05/2017   NA 140 04/08/2017   K 4.1 04/08/2017   CL 105 04/08/2017   CO2 25 04/08/2017   GLUCOSE 107 (H) 04/08/2017   BUN 9 04/08/2017   CREATININE 1.23 04/08/2017   BILITOT 0.6 04/08/2017   ALKPHOS 51 04/08/2017   AST 20 04/08/2017   ALT 15 (L) 04/08/2017   PROT 7.2 11/12/2017   ALBUMIN 3.9 04/08/2017   CALCIUM 8.9 04/08/2017   GFRAA >60 04/08/2017    Speciality Comments: No specialty comments available.  Procedures:  No procedures performed Allergies: Benazepril hcl; Pantoprazole sodium; and Amlodipine   Assessment / Plan:     Visit Diagnoses: Positive ANA (antinuclear antibody), positive RNP - /15/2019 nucleolar 1:320, -Ro, -La -.  Patient has no clinical features of autoimmune disease except crackles in his lung bases.  At this point we have not initiated any therapy.  I would like the input from  pulmonologist.  Idiopathic pulmonary fibrosis (HCC)-patient had treated recent lung biopsy and is awaiting decision after the ILD conference per notes of Dr. Vaughan Browner.  At this time he is on only immunosuppressive therapy.  Primary osteoarthritis of both hands-joint protection muscle strengthening was discussed.  He had no synovitis.  Primary osteoarthritis of right knee-I reviewed x-rays of his right knee joint.  He has some warmth in his right knee joint.  I offered cortisone injection but he declined.  DDD (degenerative disc disease), cervical-he has very limited range of motion of his C-spine due to previous fusion.  Pulmonary nodule  Other emphysema (HCC) -followed up by Dr. Vaughan Browner  Other medical problems are listed as follows: Tobacco use disorder  CKD (chronic kidney disease), stage II  History of gastroesophageal reflux (GERD)  History of diverticulosis  History of hiatal hernia  History of hypertension  History of hyperlipidemia  History of abdominal aortic aneurysm (AAA)   Orders: No orders of the defined types were placed in this encounter.  No orders of the defined types were placed in this encounter.     Follow-Up Instructions: Return in about 6 months (around 10/04/2018) for Osteoarthritis, ILD?Marland Kitchen   Bo Merino, MD  Note - This record has been created using Editor, commissioning.  Chart creation errors have been sought, but may not always  have been located. Such creation errors do not reflect on  the standard of medical care.

## 2018-03-26 LAB — FUNGUS CULTURE WITH STAIN

## 2018-03-26 LAB — FUNGUS CULTURE RESULT

## 2018-03-26 LAB — FUNGAL ORGANISM REFLEX

## 2018-03-31 ENCOUNTER — Ambulatory Visit (INDEPENDENT_AMBULATORY_CARE_PROVIDER_SITE_OTHER): Payer: Medicare HMO | Admitting: Pulmonary Disease

## 2018-03-31 ENCOUNTER — Encounter: Payer: Self-pay | Admitting: Pulmonary Disease

## 2018-03-31 VITALS — BP 138/80 | HR 70 | Ht 68.0 in | Wt 188.0 lb

## 2018-03-31 DIAGNOSIS — J849 Interstitial pulmonary disease, unspecified: Secondary | ICD-10-CM

## 2018-03-31 DIAGNOSIS — F17200 Nicotine dependence, unspecified, uncomplicated: Secondary | ICD-10-CM | POA: Diagnosis not present

## 2018-03-31 NOTE — Progress Notes (Signed)
[name]    [id number]    1951-01-11  Primary Care Physician:Mann, Carlean Jews, PA-C  Referring Physician: Cory Munch, PA-C [place], [name] [address]  Chief complaint:   Follow-up for emphysema, active smoker, ILD  HPI: 68 year old with history of emphysema, hemoptysis, GERD, active smoker hypertension, coronary artery disease, AAA.  He was previously followed by Dr. Ashok Cordia.  History noted for pulmonary emphysema with no obstruction on PFTs.  History of hemoptysis several years ago which has resolved, no parenchymal abnormalities on chest imaging.  He also has history of severe GERD status post esophageal dilatation in 2016 and 2018.  He continues on antiacid medication.  He was hospitalized in January 2019 for atypical chest pain.  Underwent left heart cath on 02/04/17 which was negative for any abnormality of his coronary arteries.  Pets: Has a dog, cat, chicken, [place], pheasent, peacock in his yard.  He has a parakeet inside his house. Occupation: Used to work Biomedical engineer.  Currently retired Exposures: Exposure to fumes and dust in his line of work.  No mold, hot tub at home Smoking history: 50-pack-year smoking history.  Continues to smoke half pack per day Travel History: Used to travel while working.  No significant travel recently.  Interim history: We advised him to get rid of the [facility].  He has not done so since he is attached to the pet however he is moved it to a different room and is trying to limit his exposure to it. Evaluated by Dr. 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  Underwent bronchoscope with BAL showing pneumococcus and Moraxella.  He is completed a course of Augmentin for this Has chronic dyspnea on exertion, cough with white mucus production.  We have gone back and forth over lung biopsy and he has decided to defer it for now.  Outpatient Encounter  Medications as of 03/31/2018  Medication Sig  . albuterol (PROAIR HFA) 108 (90 Base) MCG/ACT inhaler Inhale 2 puffs every 6 (six) hours as needed into the lungs. (Patient taking differently: Inhale 2 puffs into the lungs every 6 (six) hours as needed for wheezing or shortness of breath. )  . aspirin EC 81 MG tablet Take 81 mg by mouth daily.  . budesonide-formoterol (SYMBICORT) 160-4.5 MCG/ACT inhaler Inhale 2 puffs into the lungs 2 (two) times daily. (Patient taking differently: Inhale 2 puffs into the lungs 2 (two) times daily as needed (for shortness of breath or wheezing). )  . diclofenac sodium (VOLTAREN) 1 % GEL 3 grams to 3 large joints up to 3 times daily (Patient taking differently: Apply 1 application topically daily as needed (for joint pain). )  . esomeprazole (NEXIUM) 40 MG capsule Take 1 capsule (40 mg total) by mouth 2 (two) times daily before a meal.  . fluticasone (FLONASE) 50 MCG/ACT nasal spray SPRAY TWICE INTO EACH NOSTRIL EVERY DAY (Patient taking differently: Place 2 sprays into both nostrils daily as needed for allergies. )  . losartan-hydrochlorothiazide (HYZAAR) 100-12.5 MG tablet Take one-half tablet daily by mouth (Patient taking differently: Take 0.5 tablets by mouth daily as needed (SBP >130 or headache from high blood pressure). )  . meclizine (ANTIVERT) 25 MG tablet Take 1 tablet (25 mg total) by mouth 3 (three) times daily as needed for dizziness.  . Oxycodone HCl 20 MG TABS Take 20 mg by mouth every 4 (four) hours.   . Pediatric [place] (  KIDS GUMMY BEAR VITAMINS) CHEW Chew 2 each by mouth daily.   Marland Kitchen Spacer/Aero-Holding Chambers (AEROCHAMBER MV) inhaler Use as instructed   No facility-administered encounter medications on file as of 03/31/2018.    Physical Exam: Blood pressure 138/80, pulse 70, height _0  (1.727 m), weight 188 lb (85.3 kg), SpO2 97 %. Gen:      No acute distress HEENT:  EOMI, sclera anicteric Neck:     No masses; no thyromegaly Lungs:     Clear to auscultation bilaterally; normal respiratory effort CV:         Regular rate and rhythm; no murmurs Abd:      + bowel sounds; soft, non-tender; no palpable masses, no distension Ext:    No edema; adequate peripheral perfusion Skin:      Warm and dry; no rash Neuro: alert and oriented x 3 Psych: normal mood and affect  Data Reviewed: Imaging: CT abdomen pelvis 07/20/10-lung images show subtle basilar atelectasis CT scan 10/13/14-dependent bibasilar atelectasis, left upper lobe 3 mm nodule CT scan 03/31/15- atelectasis, stable left upper lobe 3 mm nodule CT scan 10/18/15- atelectasis, stable left upper lobe 3 mm nodule. Chest x-ray 02/03/17-suspected mild interstitial lung disease in the lower lobes.  High res CT chest 06/04/17-bibasilar fibrosis with mild bronchiectasis.  No honeycombing.  Patchy tree-in-bud opacities in the right upper lobe.  Left main and coronary atheosclerosis High res CT chest 11/21/2017-progression of mild basilar predominant fibrotic lung disease, no honeycombing.  Probable UIP.  Two-vessel coronary atherosclerosis I have reviewed the images personally.  PFTs  12/31/14-FVC 3.44 [98%], FEV1 2.82 [85%], F/F 82, TLC 80%, DLCO 61% Moderate reduction in diffusion capacity.  No obstruction or restriction.  05/17/15-FVC 4.36 [82%], FEV1 3.28 [83%], F/F 75, DLCO 55% Normal spirometry.  Moderate reduction in diffusion capacity  06/12/17- FVC 4.29 [78%], FEV1 2.20 [83%), F/F 75, DLCO 54% Moderate diffusion defect  6MWT 12/31/14- 326 meters / Baseline Sat 96% on RA / Nadir Sat 96% on RA 09/20/17-336 m, baseline sat 98%, nadir sat 98% on room air  Labs 10/29/14 ALPHA-1 AT: MM (145)  Connective tissue serologies 06/05/2017-positive ANA, 1:320 titer, nucleolar pattern Hypersensitivity panel, CCP, Sjogren's panel, ANCA 06/05/2017-negative  Sputum culture 03/02/2017- AFB negative, fungus culture-light growth of yeast Normal oropharyngeal flora.  Assessment:  Evaluation  for interstitial lung disease CT scan reviewed lower lobe reticulation, scarring with no clear evidence of honeycombing This could be from hypersensitivity pneumonitis although the pattern of lower lobe predominance is unusual. Other possibilities include chronic aspiration. He does have a parakeet at home which he is reluctant to get rid of, however he is avoiding significant exposure by moving into a different room. Connective tissue disease serology reviewed with positive ANA.   Differential diagnosis at this point includes chronic hypersensitivity pneumonitis, fibrotic NSIP from connective tissue disease, IPF. Since the treatment is different for each of these we will need a definitve diagnosis.  He has been reluctant to get a surgical lung biopsy.  Bronchoscope with Clabe Seal classifier is negative for UIP fibrosis.  Since we still do not have a clear diagnosis I have encouraged him to go ahead with the lung biopsy but he has deferred..  Will discuss at multidisciplinary ILD conference.  Emphysema PFTs do not show any obstruction.  Continues on Symbicort which he is using intermittently Continues to smoke.  Encouraged smoking cessation.  Subcentimeter pulmonary nodule Has remained stable on follow-up imaging from 2016 to 2017.  This likely benign.   Follow-up on repeat  imaging  Acid reflux, Barrett's esophagus Status post dilation by GI.  Continues on Nexium.    Chronic sinusitis, postnasal drip OTC chlorphentermine and Flonase nasal spray.  Health maintenance Declined vaccination  Plan/Recommendations: - [facility]CD ILD conference discussion - Continue Symbicort, smoking cessation  Marshell Garfinkel MD [facility] 03/31/2018, 10:32 AM  CC: William Munch, PA-C

## 2018-03-31 NOTE — Patient Instructions (Addendum)
Respect your decision not to undergo the lung biopsy I will discuss the case at our ILD conference this week  Follow-up in 3 months with spirometry, diffusion capacity and 6-minute walk test.

## 2018-04-01 DIAGNOSIS — J849 Interstitial pulmonary disease, unspecified: Secondary | ICD-10-CM | POA: Diagnosis not present

## 2018-04-02 ENCOUNTER — Other Ambulatory Visit: Payer: Self-pay

## 2018-04-02 ENCOUNTER — Ambulatory Visit (INDEPENDENT_AMBULATORY_CARE_PROVIDER_SITE_OTHER): Payer: Medicare HMO | Admitting: Internal Medicine

## 2018-04-02 ENCOUNTER — Encounter (INDEPENDENT_AMBULATORY_CARE_PROVIDER_SITE_OTHER): Payer: Self-pay | Admitting: Internal Medicine

## 2018-04-02 VITALS — BP 134/73 | HR 69 | Temp 98.2°F | Ht 69.0 in | Wt 188.4 lb

## 2018-04-02 DIAGNOSIS — K219 Gastro-esophageal reflux disease without esophagitis: Secondary | ICD-10-CM | POA: Diagnosis not present

## 2018-04-02 MED ORDER — ESOMEPRAZOLE MAGNESIUM 40 MG PO CPDR: 40.0000 mg | DELAYED_RELEASE_CAPSULE | Freq: Two times a day (BID) | ORAL | 5 refills | Status: DC

## 2018-04-02 NOTE — Progress Notes (Signed)
Subjective:    Patient ID: [name], male    DOB: 1950/05/28, 68 y.o.   MRN: [id number]  HPI Here today for f/u. He tells me he is doing good. GERD controlled with the Nexium BID. His appetite is good. No weight loss. BMS are normal. No melena or BRRB. Has been seeing Dr. Fuller Plan for his GI complaints. He can eat about anything he wants.  He is retired.  He has no GI complaints today.  Needs refill on his Nexium.      Review of Systems Past Medical History:  Diagnosis Date  . AAA (abdominal aortic aneurysm) ([place])    a. s/p EVAR 05/2010.  . Back pain   . CKD (chronic kidney disease), stage II   . Degenerative disk disease    a. Chronic back pain with ridiculopathy.  . Diverticulosis   . Emphysema of lung ([place])   . Essential hypertension   . GERD (gastroesophageal reflux disease)   . Hiatal hernia   . Hyperlipidemia   . 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 Pulmonary nodule    a. 63mm nodule seen on CT 09/2014 - f/u recommended 1 yr.  . Skin cancer   . Tobacco abuse     Past Surgical History:  Procedure Laterality Date  . ABDOMINAL AORTIC ENDOVASCULAR STENT GRAFT  2014  . APPENDECTOMY    . BACK SURGERY    . BIOPSY N/A 06/10/2013   Procedure: BIOPSY;  Surgeon: Rogene Houston, MD;  Location: [facility];  Service: Endoscopy;  Laterality: N/A;  . BIOPSY  12/03/2014   Procedure: BIOPSY (Gastroesophageal Junction);  Surgeon: Rogene Houston, MD;  Location: [facility];  Service: Endoscopy;;  . ESOPHAGEAL DILATION N/A 12/03/2014   Procedure: ESOPHAGEAL DILATION WITH 54FR AND 56FR MALONEY;  Surgeon: Rogene Houston, MD;  Location: [facility];  Service: Endoscopy;  Laterality: N/A;  . ESOPHAGOGASTRODUODENOSCOPY (EGD) WITH PROPOFOL N/A 06/10/2013   Procedure: ESOPHAGOGASTRODUODENOSCOPY (EGD) WITH PROPOFOL;  Surgeon: Rogene Houston, MD;  Location: [facility];  Service: Endoscopy;  Laterality: N/A;  . ESOPHAGOGASTRODUODENOSCOPY (EGD) WITH PROPOFOL N/A 12/03/2014   Procedure: ESOPHAGOGASTRODUODENOSCOPY (EGD) WITH PROPOFOL;  Surgeon: Rogene Houston, MD;  Location: [facility];  Service: Endoscopy;  Laterality: N/A;  . EVAR  06/20/10   for AAA repair   . LEFT HEART CATH AND CORONARY ANGIOGRAPHY N/A 02/04/2017   Procedure: LEFT HEART CATH AND CORONARY ANGIOGRAPHY;  Surgeon: Lorretta Harp, MD;  Location: [facility];  Service: Cardiovascular;  Laterality: N/A;  . MALONEY DILATION N/A 06/10/2013   Procedure: MALONEY DILATION 52 fr, 43 fr;  Surgeon: Rogene Houston, MD;  Location: [facility];  Service: Endoscopy;  Laterality: N/A;  . SPINAL FUSION     of neck and lower back  . VIDEO BRONCHOSCOPY Bilateral 02/24/2018   Procedure: VIDEO BRONCHOSCOPY WITH FLUORO;  Surgeon: Marshell Garfinkel, MD;  Location: [facility];  Service: Cardiopulmonary;  Laterality: Bilateral;    Allergies  Allergen Reactions  . Benazepril Hcl Shortness Of Breath and Other (See Comments)    Can't breathe  . Pantoprazole Sodium Shortness Of Breath and Other (See Comments)    Can't breathe  . Amlodipine Swelling and Other (See Comments)    Location of swelling not recalled by the patient    Current Outpatient Medications on File Prior to Visit  Medication Sig Dispense Refill  . albuterol (PROAIR HFA) 108 (90 Base) MCG/ACT inhaler Inhale 2 puffs every  6 (six) hours as needed into the lungs. (Patient taking differently: Inhale 2 puffs into the lungs every 6 (six) hours as needed for wheezing or shortness of breath. ) 1 Inhaler 6  . aspirin EC 81 MG tablet Take 81 mg by mouth daily.    . budesonide-formoterol (SYMBICORT) 160-4.5 MCG/ACT inhaler Inhale 2 puffs into the lungs 2 (two) times daily. (Patient taking differently: Inhale 2 puffs into the lungs 2 (two) times daily as needed (for shortness of breath or wheezing). ) 1 Inhaler 6  . diclofenac sodium (VOLTAREN) 1 % GEL 3 grams to 3 large joints up to 3 times daily (Patient taking differently: Apply 1 application topically daily as  needed (for joint pain). ) 3 Tube 3  . esomeprazole (NEXIUM) 40 MG capsule Take 1 capsule (40 mg total) by mouth 2 (two) times daily before a meal. 60 capsule 5  . losartan-hydrochlorothiazide (HYZAAR) 100-12.5 MG tablet Take one-half tablet daily by mouth (Patient taking differently: Take 0.5 tablets by mouth daily as needed (SBP >130 or headache from high blood pressure). ) 30 tablet 3  . meclizine (ANTIVERT) 25 MG tablet Take 1 tablet (25 mg total) by mouth 3 (three) times daily as needed for dizziness. 30 tablet 0  . Oxycodone HCl 20 MG TABS Take 20 mg by mouth every 4 (four) hours.     . Pediatric Multivit-Minerals-C (KIDS GUMMY BEAR VITAMINS) CHEW Chew 2 each by mouth daily.     Marland Kitchen Spacer/Aero-Holding Chambers (AEROCHAMBER MV) inhaler Use as instructed 1 each 0   No current facility-administered medications on file prior to visit.         Objective:   Physical Exam Blood pressure 134/73, pulse 69, temperature 98.2 F (36.8 C), height 5\' 9"  (1.753 m), weight 188 lb 6.4 oz (85.5 kg). Alert and oriented. Skin warm and dry. Oral mucosa is moist.   . Sclera anicteric, conjunctivae is pink. Thyroid not enlarged. No cervical lymphadenopathy. Lungs clear. Heart regular rate and rhythm.  Abdomen is soft. Bowel sounds are positive. No hepatomegaly. No abdominal masses felt. No tenderness.  No edema to lower extremities.           Assessment & Plan:  GERD. Will refill his Nexium.  OV in as needed. He is a patient of Dr. Lynne Leader.

## 2018-04-03 ENCOUNTER — Ambulatory Visit (INDEPENDENT_AMBULATORY_CARE_PROVIDER_SITE_OTHER): Payer: Medicare HMO | Admitting: Rheumatology

## 2018-04-03 ENCOUNTER — Encounter: Payer: Self-pay | Admitting: Physician Assistant

## 2018-04-03 VITALS — BP 128/73 | HR 73 | Resp 14 | Ht 69.0 in | Wt 188.0 lb

## 2018-04-03 DIAGNOSIS — F17200 Nicotine dependence, unspecified, uncomplicated: Secondary | ICD-10-CM

## 2018-04-03 DIAGNOSIS — R911 Solitary pulmonary nodule: Secondary | ICD-10-CM

## 2018-04-03 DIAGNOSIS — M5030 Other cervical disc degeneration, unspecified cervical region: Secondary | ICD-10-CM | POA: Diagnosis not present

## 2018-04-03 DIAGNOSIS — M1711 Unilateral primary osteoarthritis, right knee: Secondary | ICD-10-CM | POA: Diagnosis not present

## 2018-04-03 DIAGNOSIS — R768 Other specified abnormal immunological findings in serum: Secondary | ICD-10-CM | POA: Diagnosis not present

## 2018-04-03 DIAGNOSIS — N182 Chronic kidney disease, stage 2 (mild): Secondary | ICD-10-CM | POA: Diagnosis not present

## 2018-04-03 DIAGNOSIS — J438 Other emphysema: Secondary | ICD-10-CM | POA: Diagnosis not present

## 2018-04-03 DIAGNOSIS — M19041 Primary osteoarthritis, right hand: Secondary | ICD-10-CM | POA: Diagnosis not present

## 2018-04-03 DIAGNOSIS — I129 Hypertensive chronic kidney disease with stage 1 through stage 4 chronic kidney disease, or unspecified chronic kidney disease: Secondary | ICD-10-CM | POA: Diagnosis not present

## 2018-04-03 DIAGNOSIS — J84112 Idiopathic pulmonary fibrosis: Secondary | ICD-10-CM

## 2018-04-03 DIAGNOSIS — M19042 Primary osteoarthritis, left hand: Secondary | ICD-10-CM

## 2018-04-03 DIAGNOSIS — Z8679 Personal history of other diseases of the circulatory system: Secondary | ICD-10-CM

## 2018-04-03 DIAGNOSIS — Z8719 Personal history of other diseases of the digestive system: Secondary | ICD-10-CM

## 2018-04-03 DIAGNOSIS — Z8639 Personal history of other endocrine, nutritional and metabolic disease: Secondary | ICD-10-CM

## 2018-04-07 ENCOUNTER — Ambulatory Visit (INDEPENDENT_AMBULATORY_CARE_PROVIDER_SITE_OTHER): Payer: Medicare HMO | Admitting: Pulmonary Disease

## 2018-04-07 DIAGNOSIS — J849 Interstitial pulmonary disease, unspecified: Secondary | ICD-10-CM

## 2018-04-07 NOTE — Progress Notes (Signed)
   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  Also have significant exposure to birds and positive ANA serologies Clinical diagnosis of IPF versus hypersensitivity versus connective tissue disease associated ILD. Underwent bronchoscopy with Envisia test which was negative for UIP fibrosis Review radiology, bronch test results  Serology:  Connective tissue serologies 06/05/2017-positive ANA, 1:320 titer, nucleolar pattern Hypersensitivity panel, CCP, Sjogren's panel, ANCA 06/05/2017-negative  MDD discussion of CT scan    - Date or time period of scan: 06/04/17, 11/21/17  - Features mentioned:  High res CT chest 06/04/17-bibasilar fibrosis with mild bronchiectasis.  No honeycombing.  Patchy tree-in-bud opacities in the right upper lobe.    High res CT chest 11/21/2017-progression of mild basilar predominant fibrotic lung disease, no honeycombing.  Probable UIP.    - What is the final conclusion per 2018 ATS/Fleischner Criteria - Probable UIP  PFTs:  Moderate diffusion defect  Labs: Envisia classifier 02/24/2018- Negative for UIP  BAL 02/24/2018-WBC 365, 10% lymphs, 20% eos, 27% neutrophils, 43% monocyte macrophage. CD4:CD8 ratio- .02  MDD Impression/Recs: Probable UIP pattern on CT scan.  Bronchoscopy was non diagnostic Differential diagnosis includes IPF, hypersensitivity, connective tissue disease related ILD. Recommend surgical lung biopsy for definitive diagnosis.  Time Spent in  preparation and discussion: 35 mins  Marshell Garfinkel MD [facility] 04/07/2018, 11:05 AM

## 2018-04-09 LAB — ACID FAST CULTURE WITH REFLEXED SENSITIVITIES (MYCOBACTERIA): Acid Fast Culture: NEGATIVE

## 2018-04-15 ENCOUNTER — Other Ambulatory Visit: Payer: Self-pay | Admitting: Pulmonary Disease

## 2018-04-28 ENCOUNTER — Other Ambulatory Visit: Payer: Self-pay

## 2018-04-28 DIAGNOSIS — I714 Abdominal aortic aneurysm, without rupture, unspecified: Secondary | ICD-10-CM

## 2018-05-06 ENCOUNTER — Other Ambulatory Visit (HOSPITAL_COMMUNITY): Payer: Medicare HMO

## 2018-05-06 ENCOUNTER — Ambulatory Visit: Payer: Medicare HMO | Admitting: Family

## 2018-05-26 ENCOUNTER — Telehealth: Payer: Self-pay | Admitting: Pulmonary Disease

## 2018-05-26 MED ORDER — ALBUTEROL SULFATE HFA 108 (90 BASE) MCG/ACT IN AERS: 2.0000 | INHALATION_SPRAY | Freq: Four times a day (QID) | RESPIRATORY_TRACT | 3 refills | Status: DC | PRN

## 2018-05-26 MED ORDER — BUDESONIDE-FORMOTEROL FUMARATE 160-4.5 MCG/ACT IN AERO: 2.0000 | INHALATION_SPRAY | Freq: Two times a day (BID) | RESPIRATORY_TRACT | 3 refills | Status: DC

## 2018-05-26 NOTE — Telephone Encounter (Signed)
Pt aware refills submitted Nothing further needed

## 2018-05-29 ENCOUNTER — Telehealth (INDEPENDENT_AMBULATORY_CARE_PROVIDER_SITE_OTHER): Payer: Self-pay | Admitting: Internal Medicine

## 2018-05-29 DIAGNOSIS — K219 Gastro-esophageal reflux disease without esophagitis: Secondary | ICD-10-CM

## 2018-05-29 MED ORDER — ESOMEPRAZOLE MAGNESIUM 40 MG PO CPDR: 40.0000 mg | DELAYED_RELEASE_CAPSULE | Freq: Every day | ORAL | 3 refills | Status: DC

## 2018-05-29 NOTE — Telephone Encounter (Signed)
Rx sent to his pharmacy x 1 a day

## 2018-06-09 ENCOUNTER — Ambulatory Visit (INDEPENDENT_AMBULATORY_CARE_PROVIDER_SITE_OTHER): Payer: Medicare HMO | Admitting: Primary Care

## 2018-06-09 ENCOUNTER — Other Ambulatory Visit: Payer: Self-pay

## 2018-06-09 ENCOUNTER — Telehealth: Payer: Self-pay | Admitting: Pulmonary Disease

## 2018-06-09 ENCOUNTER — Encounter: Payer: Self-pay | Admitting: Primary Care

## 2018-06-09 DIAGNOSIS — J849 Interstitial pulmonary disease, unspecified: Secondary | ICD-10-CM

## 2018-06-09 MED ORDER — PREDNISONE 10 MG PO TABS: ORAL_TABLET | ORAL | 0 refills | Status: DC

## 2018-06-09 NOTE — Telephone Encounter (Signed)
Called and spoke with pt letting him know that we needed to schedule him for either a televisit or video visit today with [name] and pt verbalized understanding. Pt had a mychart activation code that had expired so I did click on there to request code to be resent to pt's email. Due to pt unable to activate mychart account at the current time, I have scheduled pt for televisit with William Barrow, [name] today at 3:30. Nothing further needed.

## 2018-06-09 NOTE — Progress Notes (Signed)
Virtual Visit via Telephone Note  I connected with [name] on 06/09/18 at  3:30 PM EDT by telephone and verified that I am speaking with the correct person using two identifiers.  Location: Patient: Home Provider: Office   I discussed the limitations, risks, security and privacy concerns of performing an evaluation and management service by telephone and the availability of in person appointments. I also discussed with the patient that there may be a patient responsible charge related to this service. The patient expressed understanding and agreed to proceed.  History of Present Illness: 68 year old male, current smoker. PMH significant for ILD, emphysema, GERD, HTN, CAD, AAA. Patient of Dr. Vaughan Browner, last seen on 03/31/18. Bronchoscopy with Envisia negative for UIP, patient deferred lung biopsy. Positive ANA and RNP. HRCT in October showed continued mild progression of basilar predominant fibrotic interstitial lung disease without frank honeycombing. Referred to ILD conference and rheumatology. Maintained on Symbicort and Albuterol.   Seen by Rheumatology on 04/03/18, no clinical features for autoimmune disease identified except crackles at lung bases. No therapy initiated. Awaiting input from pulmonary.  06/09/2018 Patient called today with complaints of increased shortness of breath over the last week. Symptoms worsen with cool/damp temperature. Dry cough, no significant production. Afebrile.   Observations/Objective:  - No significant shortness of breath, wheezing or cough  Assessment and Plan:  ILD: - Acute exacerbation, needs RX prednisone taper  - HRCT in October 2019 showed mild progression. Positive ANA and RNP. - Per rheumatology no clinical features of autoimmune disease. No therapy initiated. - Needs spirometry with DLCO and 6-min walk test at next visit (currently on hold d/t covid outbreak) - Will discuss plan with Dr. Vaughan Browner    Follow Up Instructions:   FU 1-2 months  Dr. Vaughan Browner or sooner if needed  I discussed the assessment and treatment plan with the patient. The patient was provided an opportunity to ask questions and all were answered. The patient agreed with the plan and demonstrated an understanding of the instructions.   The patient was advised to call back or seek an in-person evaluation if the symptoms worsen or if the condition fails to improve as anticipated.  I provided 22 minutes of non-face-to-face time during this encounter.   Martyn Ehrich, NP

## 2018-06-09 NOTE — Patient Instructions (Addendum)
Continue Symbicort twice daily  RX prednisone taper as prescribed   Avoid triggers   Follow up with Dr. Vaughan Browner in 1-2 months or sooner if needed     Pulmonary Fibrosis  Pulmonary fibrosis is a type of lung disease that causes scarring. Over time, the scar tissue builds up in the air sacs of your lungs (alveoli). This makes it hard for you to breathe. Less oxygen can get into your blood. Scarring from pulmonary fibrosis gets worse over time. This damage is permanent and may lead to other serious health problems. What are the causes? There are many different causes of pulmonary fibrosis. Sometimes the cause is not known. This is called idiopathic pulmonary fibrosis. Other causes include:  Exposure to chemicals and substances found in agricultural, farm, Architect, or factory work. These include mold, asbestos, silica, metal dusts, and toxic fumes.  Sarcoidosis. In this disease, areas of inflammatory cells (granulomas) form and most often affect the lungs.  Autoimmune diseases. These include diseases such as rheumatoid arthritis, systemic sclerosis, or connective tissue disease.  Taking certain medicines. These include drugs used in radiation therapy or used to treat seizures, heart problems, and some infections. What increases the risk? You are more likely to develop this condition if:  You have a family history of the disease.  You are older. The condition is more common in older adults.  You have a history of smoking.  You have a job that exposes you to certain chemicals.  You have gastroesophageal reflux disease (GERD). What are the signs or symptoms? Symptoms of this condition include:  Difficulty breathing that gets worse with activity.  Shortness of breath (dyspnea).  Dry, hacking cough.  Rapid, shallow breathing during exercise or while at rest.  Bluish skin and lips.  Loss of appetite.  Weakness.  Weight loss and fatigue.  Rounded and enlarged  fingertips (clubbing). How is this diagnosed? This condition may be diagnosed based on:  Your symptoms and medical history.  A physical exam. You may also have tests, including:  A test that involves looking inside your lungs with an instrument (bronchoscopy).  Imaging studies of your lungs and heart.  Tests to measure how well you are breathing (pulmonary function tests).  Blood tests.  Tests to see how well your lungs work while you are walking (pulmonary stress test).  A procedure to remove a lung tissue sample to look at it under a microscope (biopsy). How is this treated? There is no cure for pulmonary fibrosis. Treatment focuses on managing symptoms and preventing scarring from getting worse. This may include:  Medicines, such as: ? Steroids to prevent permanent lung changes. ? Medicines to suppress your body's defense system (immune system). ? Medicines to help with lung function by reducing inflammation or scarring.  Ongoing monitoring with X-rays and lab work.  Oxygen therapy.  Pulmonary rehabilitation.  Surgery. In some cases, a lung transplant is possible. Follow these instructions at home:     Medicines  Take over-the-counter and prescription medicines only as told by your health care provider.  Keep your vaccinations up to date as recommended by your health care provider. General instructions  Do not use any products that contain nicotine or tobacco, such as cigarettes and e-cigarettes. If you need help quitting, ask your health care provider.  Get regular exercise, but do not overexert yourself. Ask your health care provider to suggest some activities that are safe for you to do. ? If you have physical limitations, you may get exercise  by walking, using a stationary bike, or doing chair exercises. ? Ask your health care provider about using oxygen while exercising.  If you are exposed to chemicals and substances at work, make sure that you wear a mask  or respirator at all times.  Join a pulmonary rehabilitation program or a support group for people with pulmonary fibrosis.  Eat small meals often so you do not get too full. Overeating can make breathing trouble worse.  Maintain a healthy weight. Lose weight if you need to.  Do breathing exercises as directed by your health care provider.  Keep all follow-up visits as told by your health care provider. This is important. Contact a health care provider if you:  Have symptoms that do not get better with medicines.  Are not able to be as active as usual.  Have trouble taking a deep breath.  Have a fever or chills.  Have blue lips or skin.  Have clubbing of your fingers. Get help right away if you:  Have a sudden worsening of your symptoms.  Have chest pain.  Cough up mucus that is dark in color.  Have a lot of headaches.  Get very confused or sleepy. Summary  Pulmonary fibrosis is a type of lung disease that causes scar tissue to build up in the air sacs of your lungs (alveoli) over time. Less oxygen can get into your blood. This makes it hard for you to breathe.  Scarring from pulmonary fibrosis gets worse over time. This damage is permanent and may lead to other serious health problems.  You are more likely to develop this condition if you have a family history of the condition or a job that exposes you to certain chemicals.  There is no cure for pulmonary fibrosis. Treatment focuses on managing symptoms and preventing scarring from getting worse. This information is not intended to replace advice given to you by your health care provider. Make sure you discuss any questions you have with your health care provider. Document Released: 03/31/2003 Document Revised: 02/13/2017 Document Reviewed: 02/13/2017 Elsevier Interactive Patient Education  2019 [employer].

## 2018-06-09 NOTE — Telephone Encounter (Signed)
Schedule a telephone or video visit for patient today with Derl Barrow NP. Thanks

## 2018-06-09 NOTE — Telephone Encounter (Signed)
Returned call to patient.  States 'anytime the weather changed it affects my breathing.'  Began with increasing SOB last night 06/08/18.  Denies fever, denies travel or concern for covid exposure.  Mild chest congestion - clear to very light green but minimal phlegm.  Very little cough.   Using symbicort BID.  Continues nexium, flonase and occ albuterol - some relief with albuterol inhaler but states he often needs prednisone for these events.  Describes this as not being able to take a relieving deep breath. Smoking about 1/2 PPD.  Primary Pulmonologist: [name] Last office visit and with whom: 03/31/18 [name]  What do we see them for (pulmonary problems): Interstitial Pulm Dis Last OV assessment/plan:  Assessment:  Evaluation for interstitial lung disease CT scan reviewed lower lobe reticulation, scarring with no clear evidence of honeycombing This could be from hypersensitivity pneumonitis although the pattern of lower lobe predominance is unusual. Other possibilities include chronic aspiration. He does have a parakeet at home which he is reluctant to get rid of, however he is avoiding significant exposure by moving into a different room. Connective tissue disease serology reviewed with positive ANA.   Differential diagnosis at this point includes chronic hypersensitivity pneumonitis, fibrotic NSIP from connective tissue disease, IPF. Since the treatment is different for each of these we will need a definitve diagnosis.  He has been reluctant to get a surgical lung biopsy.  Bronchoscope with Clabe Seal classifier is negative for UIP fibrosis.  Since we still do not have a clear diagnosis I have encouraged him to go ahead with the lung biopsy but he has deferred..  Will discuss at multidisciplinary ILD conference.  Emphysema PFTs do not show any obstruction.  Continues on Symbicort which he is using intermittently Continues to smoke.  Encouraged smoking cessation.  Subcentimeter pulmonary  nodule Has remained stable on follow-up imaging from 2016 to 2017.  This likely benign.   Follow-up on repeat imaging  Acid reflux, Barrett's esophagus Status post dilation by GI.  Continues on Nexium.    Chronic sinusitis, postnasal drip OTC chlorphentermine and Flonase nasal spray.  Health maintenance Declined vaccination  Plan/Recommendations: - [facility] ILD conference discussion - Continue Symbicort, smoking cessation  Praveen [name] MD  Respect your decision not to undergo the lung biopsy I will discuss the case at our ILD conference this week  Follow-up in 3 months with spirometry, diffusion capacity and 6-minute walk test.     Was appointment offered to patient (explain)?  Yes - open to televisit or OV but declines mychart  Reason for call: increased SOB since last night 06/08/18.  Denies fever. No recent covid exposure or travel. Feels he may benefit from prednisone  albuterol (PROAIR HFA) 108 (90 Base) MCG/ACT inhaler 2 puff, Every 6 hours PRN 3 ordered        aspirin EC 81 MG tablet 81 mg, Daily        budesonide-formoterol (SYMBICORT) 160-4.5 MCG/ACT inhaler 2 puff, 2 times daily 3 ordered       diclofenac sodium (VOLTAREN) 1 % GEL  3 ordered       Patient taking differently: 1 application Topical Daily PRN, for joint pain, (No instructions reported), Reason: Patient Preference, Informant: Self, Reported on 01/21/2018     esomeprazole (NEXIUM) 40 MG capsule 40 mg, 2 times daily before meals 5 ordered       esomeprazole (NEXIUM) 40 MG capsule 40 mg, Daily 3 ordered       fluticasone (FLONASE) 50 MCG/ACT nasal spray  2 ordered       losartan-hydrochlorothiazide (HYZAAR) 100-12.5 MG tablet  3 ordered       Patient taking differently: 0.5 tablet Oral Daily PRN, SBP >130 or headache from high blood pressure, (No instructions reported), Reason: Added Indication / FREQ , Informant: Self, Reported on 01/21/2018     meclizine (ANTIVERT) 25 MG tablet 25 mg, 3 times daily PRN  0 ordered       Oxycodone HCl 20 MG TABS 20 mg, Every 4 hours        Pediatric Multivit-Minerals-C (KIDS GUMMY BEAR VITAMINS) CHEW 2 each, Daily        Spacer/Aero-Holding Chambers (AEROCHAMBER MV) inhaler         Advised patient Dr. Vaughan Browner not in office but will route to app of the day for review and recommendations.  Sarah, please advise.  Thank you

## 2018-06-17 ENCOUNTER — Other Ambulatory Visit: Payer: Self-pay | Admitting: Pulmonary Disease

## 2018-06-26 ENCOUNTER — Other Ambulatory Visit (HOSPITAL_COMMUNITY): Admission: RE | Admit: 2018-06-26 | Payer: Medicare HMO | Source: Ambulatory Visit

## 2018-06-27 ENCOUNTER — Inpatient Hospital Stay (HOSPITAL_COMMUNITY): Admission: RE | Admit: 2018-06-27 | Payer: Medicare HMO | Source: Ambulatory Visit

## 2018-06-27 ENCOUNTER — Other Ambulatory Visit (HOSPITAL_COMMUNITY): Payer: Medicare HMO

## 2018-07-01 ENCOUNTER — Telehealth: Payer: Medicare HMO | Admitting: Pulmonary Disease

## 2018-07-10 ENCOUNTER — Ambulatory Visit: Payer: Medicare HMO | Admitting: Pulmonary Disease

## 2018-07-24 DIAGNOSIS — Z1389 Encounter for screening for other disorder: Secondary | ICD-10-CM | POA: Diagnosis not present

## 2018-07-24 DIAGNOSIS — Z6828 Body mass index (BMI) 28.0-28.9, adult: Secondary | ICD-10-CM | POA: Diagnosis not present

## 2018-07-24 DIAGNOSIS — E663 Overweight: Secondary | ICD-10-CM | POA: Diagnosis not present

## 2018-07-24 DIAGNOSIS — R1011 Right upper quadrant pain: Secondary | ICD-10-CM | POA: Diagnosis not present

## 2018-07-24 DIAGNOSIS — R0789 Other chest pain: Secondary | ICD-10-CM | POA: Diagnosis not present

## 2018-07-24 DIAGNOSIS — Z0000 Encounter for general adult medical examination without abnormal findings: Secondary | ICD-10-CM | POA: Diagnosis not present

## 2018-07-24 DIAGNOSIS — R1013 Epigastric pain: Secondary | ICD-10-CM | POA: Diagnosis not present

## 2018-07-27 ENCOUNTER — Other Ambulatory Visit: Payer: Self-pay | Admitting: Pulmonary Disease

## 2018-08-06 ENCOUNTER — Other Ambulatory Visit: Payer: Self-pay

## 2018-08-06 ENCOUNTER — Ambulatory Visit: Payer: Medicare HMO | Admitting: Family

## 2018-08-06 NOTE — Progress Notes (Deleted)
[facility] [place]  CC: Follow up s/p Endovascular Repair of Abdominal Aortic Aneurysm    History of Present Illness  [name] is a 68 y.o. (Dec 07, 1950) male who is s/p EVAR (Date: 06/20/10) by Dr. Bridgett Larsson.  Most recent CTA (Date: 01/25/16) demonstrates: no endoleak and small shrunken sac size.  The patient has not had back or abdominal pain.  He takes a daily 81 mg ASA, does not take a statin.   Diabetic: No, his father had DM, pt has not been diagnosed with DM Tobaccos use: smoker  (1 ppd, started in 1967)   Past Medical History:  Diagnosis Date  . AAA (abdominal aortic aneurysm) ([place])    a. s/p EVAR 05/2010.  . Back pain   . CKD (chronic kidney disease), stage II   . Degenerative disk disease    a. Chronic back pain with ridiculopathy.  . Diverticulosis   . Emphysema of lung ([place])   . Essential hypertension   . GERD (gastroesophageal reflux disease)   . Hiatal hernia   . Hyperlipidemia   . 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 Pulmonary nodule    a. 33mm nodule seen on CT 09/2014 - f/u recommended 1 yr.  . Skin cancer   . Tobacco abuse    Past Surgical History:  Procedure Laterality Date  . ABDOMINAL AORTIC ENDOVASCULAR STENT GRAFT  2014  . APPENDECTOMY    . BACK SURGERY    . BIOPSY N/A 06/10/2013   Procedure: BIOPSY;  Surgeon: Rogene Houston, MD;  Location: [facility];  Service: Endoscopy;  Laterality: N/A;  . BIOPSY  12/03/2014   Procedure: BIOPSY (Gastroesophageal Junction);  Surgeon: Rogene Houston, MD;  Location: [facility];  Service: Endoscopy;;  . ESOPHAGEAL DILATION N/A 12/03/2014   Procedure: ESOPHAGEAL DILATION WITH 54FR AND 56FR MALONEY;  Surgeon: Rogene Houston, MD;  Location: [facility];  Service: Endoscopy;  Laterality: N/A;  . ESOPHAGOGASTRODUODENOSCOPY (EGD) WITH PROPOFOL N/A 06/10/2013   Procedure: ESOPHAGOGASTRODUODENOSCOPY (EGD) WITH PROPOFOL;  Surgeon: Rogene Houston, MD;  Location: [facility];  Service:  Endoscopy;  Laterality: N/A;  . ESOPHAGOGASTRODUODENOSCOPY (EGD) WITH PROPOFOL N/A 12/03/2014   Procedure: ESOPHAGOGASTRODUODENOSCOPY (EGD) WITH PROPOFOL;  Surgeon: Rogene Houston, MD;  Location: [facility];  Service: Endoscopy;  Laterality: N/A;  . EVAR  06/20/10   for AAA repair   . LEFT HEART CATH AND CORONARY ANGIOGRAPHY N/A 02/04/2017   Procedure: LEFT HEART CATH AND CORONARY ANGIOGRAPHY;  Surgeon: Lorretta Harp, MD;  Location: [facility];  Service: Cardiovascular;  Laterality: N/A;  . MALONEY DILATION N/A 06/10/2013   Procedure: MALONEY DILATION 52 fr, 96 fr;  Surgeon: Rogene Houston, MD;  Location: [facility];  Service: Endoscopy;  Laterality: N/A;  . SPINAL FUSION     of neck and lower back  . VIDEO BRONCHOSCOPY Bilateral 02/24/2018   Procedure: VIDEO BRONCHOSCOPY WITH FLUORO;  Surgeon: Marshell Garfinkel, MD;  Location: [facility];  Service: Cardiopulmonary;  Laterality: Bilateral;   Social History Social History   Tobacco Use  . Smoking status: Current Every Day Smoker    Packs/day: 1.00    Years: 48.00    Pack years: 48.00    Types: Cigarettes    Start date: 12/16/1965  . Smokeless tobacco: Never Used  Substance Use Topics  . Alcohol use: No    Alcohol/week: 0.0 standard drinks  . Drug use: No   Family History Family History  Problem Relation Age of Onset  . Atrial fibrillation Mother   . Heart disease Mother        Patient unclear of what kind  . Deep vein thrombosis Mother   . Hyperlipidemia Mother   . Hypertension Mother   . Stroke Father   . Diabetes Father   . Heart disease Father        Patient unclear of what kind  . Kidney disease Father   . Hyperlipidemia Father   . Hypertension Father   . Diabetes Sister   . Heart disease Sister        Patient unclear of what kind  . Brain cancer Sister   . Seizures Sister   . Deep vein thrombosis Other   . Pulmonary embolism Other   . Cancer Paternal Grandmother   . Lung disease Neg Hx   . Colon cancer Neg  Hx   . Esophageal cancer Neg Hx   . Rectal cancer Neg Hx   . Stomach cancer Neg Hx    Current Outpatient Medications on File Prior to Visit  Medication Sig Dispense Refill  . albuterol (PROAIR HFA) 108 (90 Base) MCG/ACT inhaler Inhale 2 puffs into the lungs every 6 (six) hours as needed. 1 Inhaler 3  . aspirin EC 81 MG tablet Take 81 mg by mouth daily.    . budesonide-formoterol (SYMBICORT) 160-4.5 MCG/ACT inhaler Inhale 2 puffs into the lungs 2 (two) times daily. 1 Inhaler 3  . diclofenac sodium (VOLTAREN) 1 % GEL 3 grams to 3 large joints up to 3 times daily (Patient taking differently: Apply 1 application topically daily as needed (for joint pain). ) 3 Tube 3  . esomeprazole (NEXIUM) 40 MG capsule Take 1 capsule (40 mg total) by mouth 2 (two) times daily before a meal. 60 capsule 5  . esomeprazole (NEXIUM) 40 MG capsule Take 1 capsule (40 mg total) by mouth daily at 12 noon. 30 capsule 3  . fluticasone (FLONASE) 50 MCG/ACT nasal spray PLACE 2 SPRAYS INTO EACH NOSTRIL EVERY DAY 48 mL 0  . losartan-hydrochlorothiazide (HYZAAR) 100-12.5 MG tablet Take one-half tablet daily by mouth (Patient taking differently: Take 0.5 tablets by mouth daily as needed (SBP >130 or headache from high blood pressure). ) 30 tablet 3  . meclizine (ANTIVERT) 25 MG tablet Take 1 tablet (25 mg total) by mouth 3 (three) times daily as needed for dizziness. 30 tablet 0  . Oxycodone HCl 20 MG TABS Take 20 mg by mouth every 4 (four) hours.     . Pediatric Multivit-Minerals-C (KIDS GUMMY BEAR VITAMINS) CHEW Chew 2 each by mouth daily.     . predniSONE (DELTASONE) 10 MG tablet Take 4 tabs po daily x 3 days; then 3 tabs daily x3 days; then 2 tabs daily x3 days; then 1 tab daily x 3 days; then stop 30 tablet 0  . Spacer/Aero-Holding Chambers (AEROCHAMBER MV) inhaler Use as instructed 1 each 0   No current facility-administered medications on file prior to visit.    Allergies  Allergen Reactions  . Benazepril Hcl Shortness  Of Breath and Other (See Comments)    Can't breathe  . Pantoprazole Sodium Shortness Of Breath and Other (See Comments)    Can't breathe  . Amlodipine Swelling and Other (See Comments)    Location of swelling not recalled by the patient     ROS: See HPI for pertinent positives and negatives.  Physical Examination  There were no vitals filed for this visit. There is  no height or weight on file to calculate BMI.  General: A&O x 3, WD, *** HEENT: No gross abnormalities  Pulmonary: Sym exp, respirations are non labored, good air movement in all fields CTAB, no rales, rhonchi, or wheezing***, + rales, ***, + rhonchi, ***, + wheezing,  Cardiac: Regular rhythm and rate, no murmur appreciated  Vascular: Vessel Right Left  Radial ***Palpable ***Palpable  Brachial ***Palpable ***Palpable  Carotid  with***out bruit  with***out bruit  Aorta Not palpable N/A  Femoral ***Palpable ***Palpable  Popliteal Not palpable Not palpable  PT ***Palpable ***Palpable  DP ***Palpable ***Palpable   Gastrointestinal: soft, NTND, -G/R, - HSM, - palpable masses, - CVAT B. Musculoskeletal: M/S 5/5 throughout *** except ***, extremities without ischemic changes *** except  *** Skin: No rashes, no ulcers, no cellulitis.   Neurologic: Pain and light touch intact in extremities *** except ***, Motor exam as listed above. Psychiatric: Normal thought content, mood appropriate for clinical situation.     DATA  EVAR Duplex   Previous (Date: ***) AAA sac size: *** cm; Right CIA: ***; Left CIA: ***  Current (Date: ***)  AAA sac size: *** cm; Right CIA: ***; Left CIA: ***  *** endoleak detected  CTA Abd/Pelvis Duplex (Date: ***)  AAA sac size: *** cm x *** cm  *** endoleak detected   Medical Decision Making  [name] is a 68 y.o. male who presents s/p EVAR (Date: ***).  Pt is ***symptomatic with *** sac size.  I discussed with the patient the importance of surveillance of the  endograft.  The next endograft duplex will be scheduled for *** months.  The next CTA will be scheduled for *** months.  The patient will follow up with [place] in *** months with these studies.  I emphasized the importance of maximal medical management including strict control of blood pressure, blood glucose, and lipid levels, antiplatelet agents, obtaining regular exercise, and cessation of smoking.   Thank you for allowing [place] to participate in this patient's care.  Clemon Chambers, RN, MSN, FNP-C [facility] Office: [facility] Physician: Oneida Alar  08/06/2018, 8:34 AM

## 2018-08-15 ENCOUNTER — Other Ambulatory Visit: Payer: Self-pay

## 2018-08-15 DIAGNOSIS — I714 Abdominal aortic aneurysm, without rupture, unspecified: Secondary | ICD-10-CM

## 2018-08-20 ENCOUNTER — Telehealth (HOSPITAL_COMMUNITY): Payer: Self-pay

## 2018-08-20 NOTE — Telephone Encounter (Signed)
The above patient or their representative was contacted and gave the following answers to these questions:         Do you have any of the following symptoms? No  Fever                    Cough                   Shortness of breath  Do  you have any of the following other symptoms?  No   muscle pain         vomiting,        diarrhea        rash         weakness        red eye        abdominal pain         bruising          bruising or bleeding              joint pain           severe headache    Have you been in contact with someone who was or has been sick in the past 2 weeks? No  Yes                 Unsure                         Unable to assess   Does the person that you were in contact with have any of the following symptoms? N/A   Cough         shortness of breath           muscle pain         vomiting,            diarrhea            rash            weakness           fever            red eye           abdominal pain           bruising  or  bleeding                joint pain                severe headache                COMMENTS OR ACTION PLAN FOR THIS PATIENT:         

## 2018-08-21 ENCOUNTER — Ambulatory Visit (HOSPITAL_COMMUNITY)
Admission: RE | Admit: 2018-08-21 | Discharge: 2018-08-21 | Disposition: A | Discharge status: Home / Self Care | Payer: Medicare HMO | Source: Ambulatory Visit | Attending: Family | Admitting: Family

## 2018-08-21 ENCOUNTER — Ambulatory Visit (INDEPENDENT_AMBULATORY_CARE_PROVIDER_SITE_OTHER): Payer: Medicare HMO | Admitting: Family

## 2018-08-21 ENCOUNTER — Other Ambulatory Visit: Payer: Self-pay

## 2018-08-21 ENCOUNTER — Encounter: Payer: Self-pay | Admitting: Family

## 2018-08-21 VITALS — BP 150/92 | HR 59 | Temp 97.6°F | Resp 16 | Ht 69.0 in | Wt 187.0 lb

## 2018-08-21 DIAGNOSIS — I714 Abdominal aortic aneurysm, without rupture, unspecified: Secondary | ICD-10-CM

## 2018-08-21 DIAGNOSIS — F17200 Nicotine dependence, unspecified, uncomplicated: Secondary | ICD-10-CM | POA: Diagnosis not present

## 2018-08-21 DIAGNOSIS — Z95828 Presence of other vascular implants and grafts: Secondary | ICD-10-CM

## 2018-08-21 NOTE — Patient Instructions (Signed)
Before your next abdominal ultrasound:  Avoid gas forming foods and beverages the day before the test.   Take two Extra-Strength Gas-X capsules at bedtime the night before the test. Take another two Extra-Strength Gas-X capsules in the middle of the night if you get up to the restroom, if not, first thing in the morning with water.  Do not chew gum.     Steps to Quit Smoking Smoking tobacco is the leading cause of preventable death. It can affect almost every organ in the body. Smoking puts you and people around you at risk for many serious, long-lasting (chronic) diseases. Quitting smoking can be hard, but it is one of the best things that you can do for your health. It is never too late to quit. How do I get ready to quit? When you decide to quit smoking, make a plan to help you succeed. Before you quit:  Pick a date to quit. Set a date within the next 2 weeks to give you time to prepare.  Write down the reasons why you are quitting. Keep this list in places where you will see it often.  Tell your family, friends, and co-workers that you are quitting. Their support is important.  Talk with your doctor about the choices that may help you quit.  Find out if your health insurance will pay for these treatments.  Know the people, places, things, and activities that make you want to smoke (triggers). Avoid them. What first steps can I take to quit smoking?  Throw away all cigarettes at home, at work, and in your car.  Throw away the things that you use when you smoke, such as ashtrays and lighters.  Clean your car. Make sure to empty the ashtray.  Clean your home, including curtains and carpets. What can I do to help me quit smoking? Talk with your doctor about taking medicines and seeing a counselor at the same time. You are more likely to succeed when you do both.  If you are pregnant or breastfeeding, talk with your doctor about counseling or other ways to quit smoking. Do not  take medicine to help you quit smoking unless your doctor tells you to do so. To quit smoking: Quit right away  Quit smoking totally, instead of slowly cutting back on how much you smoke over a period of time.  Go to counseling. You are more likely to quit if you go to counseling sessions regularly. Take medicine You may take medicines to help you quit. Some medicines need a prescription, and some you can buy over-the-counter. Some medicines may contain a drug called nicotine to replace the nicotine in cigarettes. Medicines may:  Help you to stop having the desire to smoke (cravings).  Help to stop the problems that come when you stop smoking (withdrawal symptoms). Your doctor may ask you to use:  Nicotine patches, gum, or lozenges.  Nicotine inhalers or sprays.  Non-nicotine medicine that is taken by mouth. Find resources Find resources and other ways to help you quit smoking and remain smoke-free after you quit. These resources are most helpful when you use them often. They include:  Online chats with a Social worker.  Phone quitlines.  Printed Furniture conservator/restorer.  Support groups or group counseling.  Text messaging programs.  Mobile phone apps. Use apps on your mobile phone or tablet that can help you stick to your quit plan. There are many free apps for mobile phones and tablets as well as websites. Examples include Quit  Guide from the CDC and smokefree.gov  What things can I do to make it easier to quit?   Talk to your family and friends. Ask them to support and encourage you.  Call a phone quitline (1-800-QUIT-NOW), reach out to support groups, or work with a Social worker.  Ask people who smoke to not smoke around you.  Avoid places that make you want to smoke, such as: ? Bars. ? Parties. ? Smoke-break areas at work.  Spend time with people who do not smoke.  Lower the stress in your life. Stress can make you want to smoke. Try these things to help your stress: ?  Getting regular exercise. ? Doing deep-breathing exercises. ? Doing yoga. ? Meditating. ? Doing a body scan. To do this, close your eyes, focus on one area of your body at a time from head to toe. Notice which parts of your body are tense. Try to relax the muscles in those areas. How will I feel when I quit smoking? Day 1 to 3 weeks Within the first 24 hours, you may start to have some problems that come from quitting tobacco. These problems are very bad 2-3 days after you quit, but they do not often last for more than 2-3 weeks. You may get these symptoms:  Mood swings.  Feeling restless, nervous, angry, or annoyed.  Trouble concentrating.  Dizziness.  Strong desire for high-sugar foods and nicotine.  Weight gain.  Trouble pooping (constipation).  Feeling like you may vomit (nausea).  Coughing or a sore throat.  Changes in how the medicines that you take for other issues work in your body.  Depression.  Trouble sleeping (insomnia). Week 3 and afterward After the first 2-3 weeks of quitting, you may start to notice more positive results, such as:  Better sense of smell and taste.  Less coughing and sore throat.  Slower heart rate.  Lower blood pressure.  Clearer skin.  Better breathing.  Fewer sick days. Quitting smoking can be hard. Do not give up if you fail the first time. Some people need to try a few times before they succeed. Do your best to stick to your quit plan, and talk with your doctor if you have any questions or concerns. Summary  Smoking tobacco is the leading cause of preventable death. Quitting smoking can be hard, but it is one of the best things that you can do for your health.  When you decide to quit smoking, make a plan to help you succeed.  Quit smoking right away, not slowly over a period of time.  When you start quitting, seek help from your doctor, family, or friends. This information is not intended to replace advice given to you by  your health care provider. Make sure you discuss any questions you have with your health care provider. Document Released: 11/04/2008 Document Revised: 03/28/2018 Document Reviewed: 03/29/2018 Elsevier Patient Education  2020 [employer].

## 2018-08-21 NOTE — Progress Notes (Signed)
[facility]  CC: Follow up s/p Endovascular Repair of Abdominal Aortic Aneurysm    History of Present Illness  [name] is a 68 y.o. (16-Aug-1950) male who is s/p EVAR (Date: 06/21/10) by Dr. Bridgett Larsson. Most recent EVAR duplex (Date: 02-15-17 was 3.6 cm) demonstrates: no endoleak and shrinking sac size. Most recent CTA (Date: 12/05/2012) demonstrates: no endoleak and no sac.   He has had chronic back and neck pain with some radiculopathy. He attends [facility], Dr. Hardin Negus.  He denies any new back pain, reports abdominal pain that has been evaluated in 2016. He states his esophagus was dilated, but also states no etiology was found for his abdominal pain. He states he also has a hiatal hernia and excessive gas.  The patient denies any history of stroke or TIA, denies history of MI; he states he had a heart murmur as a child.  He had a recent lung biopsy, states another lung bx was recommended from another approach that would require a 3-5 day hospital stay; he states he is hesitant to do this.   Diabetic: No Tobacco use: current smoker (1 ppd, started smoking at age 43 yrs)    Past Medical History:  Diagnosis Date  . AAA (abdominal aortic aneurysm) ([place])    a. s/p EVAR 05/2010.  . Back pain   . CKD (chronic kidney disease), stage II   . Degenerative disk disease    a. Chronic back pain with ridiculopathy.  . Diverticulosis   . Emphysema of lung ([place])   . Essential hypertension   . GERD (gastroesophageal reflux disease)   . Hiatal hernia   . Hyperlipidemia   . 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 Pulmonary nodule    a. 28mm nodule seen on CT 09/2014 - f/u recommended 1 yr.  . Skin cancer   . Tobacco abuse    Past Surgical History:  Procedure Laterality Date  . ABDOMINAL AORTIC ENDOVASCULAR STENT GRAFT  2014  . APPENDECTOMY    . BACK SURGERY    . BIOPSY N/A 06/10/2013   Procedure: BIOPSY;   Surgeon: Rogene Houston, MD;  Location: [facility];  Service: Endoscopy;  Laterality: N/A;  . BIOPSY  12/03/2014   Procedure: BIOPSY (Gastroesophageal Junction);  Surgeon: Rogene Houston, MD;  Location: [facility];  Service: Endoscopy;;  . ESOPHAGEAL DILATION N/A 12/03/2014   Procedure: ESOPHAGEAL DILATION WITH 54FR AND 56FR MALONEY;  Surgeon: Rogene Houston, MD;  Location: [facility];  Service: Endoscopy;  Laterality: N/A;  . ESOPHAGOGASTRODUODENOSCOPY (EGD) WITH PROPOFOL N/A 06/10/2013   Procedure: ESOPHAGOGASTRODUODENOSCOPY (EGD) WITH PROPOFOL;  Surgeon: Rogene Houston, MD;  Location: [facility];  Service: Endoscopy;  Laterality: N/A;  . ESOPHAGOGASTRODUODENOSCOPY (EGD) WITH PROPOFOL N/A 12/03/2014   Procedure: ESOPHAGOGASTRODUODENOSCOPY (EGD) WITH PROPOFOL;  Surgeon: Rogene Houston, MD;  Location: [facility];  Service: Endoscopy;  Laterality: N/A;  . EVAR  06/20/10   for AAA repair   . LEFT HEART CATH AND CORONARY ANGIOGRAPHY N/A 02/04/2017   Procedure: LEFT HEART CATH AND CORONARY ANGIOGRAPHY;  Surgeon: Lorretta Harp, MD;  Location: [facility];  Service: Cardiovascular;  Laterality: N/A;  . MALONEY DILATION N/A 06/10/2013   Procedure: MALONEY DILATION 52 fr, 36 fr;  Surgeon: Rogene Houston, MD;  Location: [facility];  Service: Endoscopy;  Laterality: N/A;  . SPINAL FUSION     of neck and lower back  .  VIDEO BRONCHOSCOPY Bilateral 02/24/2018   Procedure: VIDEO BRONCHOSCOPY WITH FLUORO;  Surgeon: Marshell Garfinkel, MD;  Location: [facility];  Service: Cardiopulmonary;  Laterality: Bilateral;   Social History Social History   Tobacco Use  . Smoking status: Current Every Day Smoker    Packs/day: 1.00    Years: 48.00    Pack years: 48.00    Types: Cigarettes    Start date: 12/16/1965  . Smokeless tobacco: Never Used  Substance Use Topics  . Alcohol use: No    Alcohol/week: 0.0 standard drinks  . Drug use: No   Family History Family History  Problem Relation Age of Onset  . Atrial  fibrillation Mother   . Heart disease Mother        Patient unclear of what kind  . Deep vein thrombosis Mother   . Hyperlipidemia Mother   . Hypertension Mother   . Stroke Father   . Diabetes Father   . Heart disease Father        Patient unclear of what kind  . Kidney disease Father   . Hyperlipidemia Father   . Hypertension Father   . Diabetes Sister   . Heart disease Sister        Patient unclear of what kind  . Brain cancer Sister   . Seizures Sister   . Deep vein thrombosis Other   . Pulmonary embolism Other   . Cancer Paternal Grandmother   . Lung disease Neg Hx   . Colon cancer Neg Hx   . Esophageal cancer Neg Hx   . Rectal cancer Neg Hx   . Stomach cancer Neg Hx    Current Outpatient Medications on File Prior to Visit  Medication Sig Dispense Refill  . albuterol (PROAIR HFA) 108 (90 Base) MCG/ACT inhaler Inhale 2 puffs into the lungs every 6 (six) hours as needed. 1 Inhaler 3  . aspirin EC 81 MG tablet Take 81 mg by mouth daily.    . budesonide-formoterol (SYMBICORT) 160-4.5 MCG/ACT inhaler Inhale 2 puffs into the lungs 2 (two) times daily. 1 Inhaler 3  . diclofenac sodium (VOLTAREN) 1 % GEL 3 grams to 3 large joints up to 3 times daily (Patient taking differently: Apply 1 application topically daily as needed (for joint pain). ) 3 Tube 3  . esomeprazole (NEXIUM) 40 MG capsule Take 1 capsule (40 mg total) by mouth 2 (two) times daily before a meal. 60 capsule 5  . esomeprazole (NEXIUM) 40 MG capsule Take 1 capsule (40 mg total) by mouth daily at 12 noon. 30 capsule 3  . fluticasone (FLONASE) 50 MCG/ACT nasal spray PLACE 2 SPRAYS INTO EACH NOSTRIL EVERY DAY 48 mL 0  . losartan-hydrochlorothiazide (HYZAAR) 100-12.5 MG tablet Take one-half tablet daily by mouth (Patient taking differently: Take 0.5 tablets by mouth daily as needed (SBP >130 or headache from high blood pressure). ) 30 tablet 3  . meclizine (ANTIVERT) 25 MG tablet Take 1 tablet (25 mg total) by mouth 3  (three) times daily as needed for dizziness. 30 tablet 0  . Oxycodone HCl 20 MG TABS Take 20 mg by mouth every 4 (four) hours.     . Pediatric Multivit-Minerals-C (KIDS GUMMY BEAR VITAMINS) CHEW Chew 2 each by mouth daily.     . predniSONE (DELTASONE) 10 MG tablet Take 4 tabs po daily x 3 days; then 3 tabs daily x3 days; then 2 tabs daily x3 days; then 1 tab daily x 3 days; then stop 30 tablet 0  . Spacer/Aero-Holding  Chambers (AEROCHAMBER MV) inhaler Use as instructed 1 each 0   No current facility-administered medications on file prior to visit.    Allergies  Allergen Reactions  . Benazepril Hcl Shortness Of Breath and Other (See Comments)    Can't breathe  . Pantoprazole Sodium Shortness Of Breath and Other (See Comments)    Can't breathe  . Amlodipine Swelling and Other (See Comments)    Location of swelling not recalled by the patient     ROS: See HPI for pertinent positives and negatives.  Physical Examination  Vitals:   08/21/18 0837  BP: (!) 150/92  Pulse: (!) 59  Resp: 16  Temp: 97.6 F (36.4 C)  TempSrc: Temporal  SpO2: 98%  Weight: 187 lb (84.8 kg)  Height: 5\' 9"  (1.753 m)   Body mass index is 27.62 kg/m.  General: A&O x 3, male in NAD HEENT: No gross abnormalities  Pulmonary: Sym exp, respirations are non labored, fair air movement in all fields, no rales, rhonchi, or wheezing Cardiac: Regular rhythm with occasional premature contractions and compensatory pauses, no murmur appreciated  Vascular: Vessel Right Left  Radial 2+Palpable 2+Palpable  Carotid  without bruit  without bruit  Aorta Not palpable N/A  Femoral 2+Palpable 2+Palpable  Popliteal 2+ palpable 2+ palpable  PT 2+Palpable 2+Palpable  DP 2+Palpable 2+Palpable   Gastrointestinal: soft, NTND, -G/R, - HSM, - palpable masses, - CVAT B. Musculoskeletal: M/S 5/5 throughout, extremities without ischemic changes Skin: No rashes, no ulcers, no cellulitis.   Neurologic: Pain and light touch intact  in extremities, Motor exam as listed above. Psychiatric: Normal thought content, mood appropriate for clinical situation.    DATA  EVAR Duplex   Current (Date: 08-21-18) Endovascular Aortic Repair (EVAR): +----------+----------------+-------------------+-------------------+           Diameter AP (cm)Diameter Trans (cm)Velocities (cm/sec) +----------+----------------+-------------------+-------------------+ Aorta     3.19            3.22               133                 +----------+----------------+-------------------+-------------------+ Right Limb1.09            1.31               95                  +----------+----------------+-------------------+-------------------+ Left Limb 1.34            1.49               147                 +----------+----------------+-------------------+-------------------+ Summary: Abdominal Aorta: The largest aortic measurement is 3.2 cm. Suboptimal visualization due to excessive overlying bowel gas. No obvious evidence of extrastent flow noted. The largest aortic diameter has decreased compared to prior exam. Previous diameter measurement was 3.6 cm obtained on 02/15/2017.   CTA abd/pelvis (12/05/2012) Based on Dr. Lianne Moris review of this patient's CTA, his endograft is in appropriate immediate infrarenal position with widely patent renal arteries and SMA and CA. There is no evidence of limb dysfunction. The AAA sac is nearly non-existent.   Medical Decision Making  RALF KONOPKA is a 68 y.o. male who presents s/p EVAR (Date: 06/21/10).  Pt is asymptomatic with a decrease sac size, based on limited visualization on duplex due to overlying bowel gas.   Unfortunately he continues to smoke. Over 3 minutes was spent counseling  patient re smoking cessation, and patient was given several free resources re smoking cessation.   I discussed with the patient the importance of surveillance of the endograft.  The next endograft duplex will be  scheduled for 12 months.  The patient will follow up with [place] in 12 months with these studies.  I emphasized the importance of maximal medical management including strict control of blood pressure, blood glucose, and lipid levels, antiplatelet agents, obtaining regular exercise, and cessation of smoking.   Thank you for allowing [place] to participate in this patient's care.  Clemon Chambers, RN, MSN, FNP-C [facility] Office: [phone]  Clinic Physician: Laqueta Due  08/21/2018, 8:56 AM

## 2018-09-16 NOTE — Progress Notes (Deleted)
Office Visit Note  Patient: [name]             Date of Birth: 17-Apr-1950           MRN: [id number]             PCP: Ginger Organ Referring: Ginger Organ Visit Date: 09/30/2018 Occupation: @GUAROCC @  Subjective:  No chief complaint on file.   History of Present Illness: [name] is a 68 y.o. male ***   Activities of Daily Living:  Patient reports morning stiffness for *** {minute/hour:19697}.   Patient {ACTIONS;DENIES/REPORTS:21021675::"Denies"} nocturnal pain.  Difficulty dressing/grooming: {ACTIONS;DENIES/REPORTS:21021675::"Denies"} Difficulty climbing stairs: {ACTIONS;DENIES/REPORTS:21021675::"Denies"} Difficulty getting out of chair: {ACTIONS;DENIES/REPORTS:21021675::"Denies"} Difficulty using hands for taps, buttons, cutlery, and/or writing: {ACTIONS;DENIES/REPORTS:21021675::"Denies"}  No Rheumatology ROS completed.   PMFS History:  Patient Active Problem List   Diagnosis Date Noted  . ILD (interstitial lung disease) ([place])   . Primary osteoarthritis of both hands 11/22/2017  . Primary osteoarthritis of right knee 11/22/2017  . DDD (degenerative disc disease), cervical 11/22/2017  . Atypical chest pain 02/03/2017  . Acute URI 12/05/2016  . Cough 11/09/2015  . Asthmatic bronchitis with acute exacerbation 08/03/2015  . Tobacco use disorder 05/17/2015  . Emphysema of lung ([place]) 10/29/2014  . CKD (chronic kidney disease), stage II   . Pulmonary nodule   . Dysphagia 05/07/2013  . GERD (gastroesophageal reflux disease) 05/07/2013  . Chest pain 09/06/2012  . Abdominal aneurysm without mention of rupture 10/20/2010  . Preop cardiovascular exam 06/01/2010  . Cigarette smoker 06/01/2010  . AAA (abdominal aortic aneurysm) ([place]) 06/01/2010  . HTN (hypertension) 06/01/2010    Past Medical History:  Diagnosis Date  . AAA (abdominal aortic aneurysm) ([place])    a. s/p EVAR 05/2010.  . Back pain   . CKD (chronic kidney disease), stage II   .  Degenerative disk disease    a. Chronic back pain with ridiculopathy.  . Diverticulosis   . Emphysema of lung ([place])   . Essential hypertension   . GERD (gastroesophageal reflux disease)   . Hiatal hernia   . Hyperlipidemia   . 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 Pulmonary nodule    a. 45mm nodule seen on CT 09/2014 - f/u recommended 1 yr.  . Skin cancer   . Tobacco abuse     Family History  Problem Relation Age of Onset  . Atrial fibrillation Mother   . Heart disease Mother        Patient unclear of what kind  . Deep vein thrombosis Mother   . Hyperlipidemia Mother   . Hypertension Mother   . Stroke Father   . Diabetes Father   . Heart disease Father        Patient unclear of what kind  . Kidney disease Father   . Hyperlipidemia Father   . Hypertension Father   . Diabetes Sister   . Heart disease Sister        Patient unclear of what kind  . Brain cancer Sister   . Seizures Sister   . Deep vein thrombosis Other   . Pulmonary embolism Other   . Cancer Paternal Grandmother   . Lung disease Neg Hx   . Colon cancer Neg Hx   . Esophageal cancer Neg Hx   . Rectal cancer Neg Hx   . Stomach cancer Neg Hx    Past Surgical History:  Procedure Laterality Date  .  ABDOMINAL AORTIC ENDOVASCULAR STENT GRAFT  2014  . APPENDECTOMY    . BACK SURGERY    . BIOPSY N/A 06/10/2013   Procedure: BIOPSY;  Surgeon: Rogene Houston, MD;  Location: [facility];  Service: Endoscopy;  Laterality: N/A;  . BIOPSY  12/03/2014   Procedure: BIOPSY (Gastroesophageal Junction);  Surgeon: Rogene Houston, MD;  Location: [facility];  Service: Endoscopy;;  . ESOPHAGEAL DILATION N/A 12/03/2014   Procedure: ESOPHAGEAL DILATION WITH 54FR AND 56FR MALONEY;  Surgeon: Rogene Houston, MD;  Location: [facility];  Service: Endoscopy;  Laterality: N/A;  . ESOPHAGOGASTRODUODENOSCOPY (EGD) WITH PROPOFOL N/A 06/10/2013   Procedure: ESOPHAGOGASTRODUODENOSCOPY (EGD) WITH PROPOFOL;  Surgeon: Rogene Houston, MD;  Location: [facility];  Service: Endoscopy;  Laterality: N/A;  . ESOPHAGOGASTRODUODENOSCOPY (EGD) WITH PROPOFOL N/A 12/03/2014   Procedure: ESOPHAGOGASTRODUODENOSCOPY (EGD) WITH PROPOFOL;  Surgeon: Rogene Houston, MD;  Location: [facility];  Service: Endoscopy;  Laterality: N/A;  . EVAR  06/20/10   for AAA repair   . LEFT HEART CATH AND CORONARY ANGIOGRAPHY N/A 02/04/2017   Procedure: LEFT HEART CATH AND CORONARY ANGIOGRAPHY;  Surgeon: Lorretta Harp, MD;  Location: [facility];  Service: Cardiovascular;  Laterality: N/A;  . MALONEY DILATION N/A 06/10/2013   Procedure: MALONEY DILATION 52 fr, 80 fr;  Surgeon: Rogene Houston, MD;  Location: [facility];  Service: Endoscopy;  Laterality: N/A;  . SPINAL FUSION     of neck and lower back  . VIDEO BRONCHOSCOPY Bilateral 02/24/2018   Procedure: VIDEO BRONCHOSCOPY WITH FLUORO;  Surgeon: Marshell Garfinkel, MD;  Location: [facility];  Service: Cardiopulmonary;  Laterality: Bilateral;   Social History   Social History Narrative   Originally from [place], [place]. Always lived in [place]. Travel to the [place] in March 2016. Has traveled to [place], [place], [place], & [place]. 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 No known asbestos exposure. Has 2 parakeets currently. Remotely raised parakeets, love birds, & cockatiels in a different house. No known mold or hot tub exposure.    There is no immunization history for the selected administration types on file for this patient.   Objective: Vital Signs: There were no vitals taken for this visit.   Physical Exam   Musculoskeletal Exam: ***  CDAI Exam: CDAI Score: - Patient Global: -; Provider Global: - Swollen: -; Tender: - Joint Exam   No joint exam has been documented for this visit   There is currently no information documented on the homunculus. Go to the Rheumatology activity and complete the homunculus joint exam.   Investigation: No additional findings.  Imaging: Vas [place] Evar Duplex  Result Date: 08/21/2018 Endovascular Aortic Repair Study (EVAR) Risk Factors: Hypertension, hyperlipidemia, current smoker. Vascular Interventions: Endovascular repair 06/20/2010. Limitations: Air/bowel gas and obesity.  Performing Technologist: Burley Saver RVT  Examination Guidelines: A complete evaluation includes B-mode imaging, spectral Doppler, color Doppler, and power Doppler as needed of all accessible portions of each vessel. Bilateral testing is considered an integral part of a complete examination. Limited examinations for reoccurring indications may be performed as noted.  Endovascular Aortic Repair (EVAR): +----------+----------------+-------------------+-------------------+           Diameter AP (cm)Diameter Trans (cm)Velocities (cm/sec) +----------+----------------+-------------------+-------------------+ Aorta     3.19            3.22               133                 +----------+----------------+-------------------+-------------------+  Right Limb1.09            1.31               95                  +----------+----------------+-------------------+-------------------+ Left Limb 1.34            1.49               147                 +----------+----------------+-------------------+-------------------+  Summary: Abdominal Aorta: The largest aortic measurement is 3.2 cm. Suboptimal visualization due to excessive overlying bowel gas. No obvious evidence of extrastent flow noted. The largest aortic diameter has decreased compared to prior exam. Previous diameter measurement was 3.6 cm obtained on 02/15/2017.  *See table(s) above for measurements and observations.  Electronically signed by Ruta Hinds MD on 08/21/2018 at 1:29:09 PM.   Final     Recent Labs: Lab Results  Component Value Date   WBC 11.3 (H) 06/05/2017   HGB 13.5 06/05/2017   PLT 342.0 06/05/2017   NA 140 04/08/2017   K 4.1 04/08/2017    CL 105 04/08/2017   CO2 25 04/08/2017   GLUCOSE 107 (H) 04/08/2017   BUN 9 04/08/2017   CREATININE 1.23 04/08/2017   BILITOT 0.6 04/08/2017   ALKPHOS 51 04/08/2017   AST 20 04/08/2017   ALT 15 (L) 04/08/2017   PROT 7.2 11/12/2017   ALBUMIN 3.9 04/08/2017   CALCIUM 8.9 04/08/2017   GFRAA >60 04/08/2017    Speciality Comments: No specialty comments available.  Procedures:  No procedures performed Allergies: Benazepril hcl, Pantoprazole sodium, and Amlodipine   Assessment / Plan:     Visit Diagnoses: No diagnosis found.  Orders: No orders of the defined types were placed in this encounter.  No orders of the defined types were placed in this encounter.   Face-to-face time spent with patient was *** minutes. Greater than 50% of time was spent in counseling and coordination of care.  Follow-Up Instructions: No follow-ups on file.   Ofilia Neas, PA-C  Note - This record has been created using Dragon software.  Chart creation errors have been sought, but may not always  have been located. Such creation errors do not reflect on  the standard of medical care.

## 2018-09-30 ENCOUNTER — Ambulatory Visit: Payer: Self-pay | Admitting: Physician Assistant

## 2018-10-30 ENCOUNTER — Other Ambulatory Visit: Payer: Self-pay | Admitting: Pulmonary Disease

## 2018-11-03 ENCOUNTER — Other Ambulatory Visit: Payer: Self-pay | Admitting: Rheumatology

## 2018-11-21 ENCOUNTER — Other Ambulatory Visit: Payer: Self-pay | Admitting: Pulmonary Disease

## 2019-01-12 DIAGNOSIS — H524 Presbyopia: Secondary | ICD-10-CM | POA: Diagnosis not present

## 2019-02-03 DIAGNOSIS — I10 Essential (primary) hypertension: Secondary | ICD-10-CM | POA: Diagnosis not present

## 2019-02-03 DIAGNOSIS — Z6829 Body mass index (BMI) 29.0-29.9, adult: Secondary | ICD-10-CM | POA: Diagnosis not present

## 2019-02-03 DIAGNOSIS — E663 Overweight: Secondary | ICD-10-CM | POA: Diagnosis not present

## 2019-02-03 DIAGNOSIS — Z1389 Encounter for screening for other disorder: Secondary | ICD-10-CM | POA: Diagnosis not present

## 2019-02-03 DIAGNOSIS — J449 Chronic obstructive pulmonary disease, unspecified: Secondary | ICD-10-CM | POA: Diagnosis not present

## 2019-02-16 ENCOUNTER — Other Ambulatory Visit (INDEPENDENT_AMBULATORY_CARE_PROVIDER_SITE_OTHER): Payer: Self-pay | Admitting: Gastroenterology

## 2019-02-16 DIAGNOSIS — K219 Gastro-esophageal reflux disease without esophagitis: Secondary | ICD-10-CM

## 2019-02-16 MED ORDER — ESOMEPRAZOLE MAGNESIUM 40 MG PO CPDR: 40.0000 mg | DELAYED_RELEASE_CAPSULE | Freq: Every day | ORAL | 3 refills | Status: DC

## 2019-02-16 NOTE — Progress Notes (Signed)
Refill requested from pharmacy - nexium sent to pharmacy

## 2019-05-18 ENCOUNTER — Other Ambulatory Visit: Payer: Self-pay | Admitting: Pulmonary Disease

## 2019-05-18 DIAGNOSIS — G894 Chronic pain syndrome: Secondary | ICD-10-CM | POA: Diagnosis not present

## 2019-05-18 DIAGNOSIS — Z79891 Long term (current) use of opiate analgesic: Secondary | ICD-10-CM | POA: Diagnosis not present

## 2019-08-18 ENCOUNTER — Other Ambulatory Visit: Payer: Self-pay

## 2019-08-18 DIAGNOSIS — I714 Abdominal aortic aneurysm, without rupture, unspecified: Secondary | ICD-10-CM

## 2019-08-21 ENCOUNTER — Telehealth: Payer: Self-pay | Admitting: *Deleted

## 2019-08-24 ENCOUNTER — Ambulatory Visit (INDEPENDENT_AMBULATORY_CARE_PROVIDER_SITE_OTHER): Payer: Medicare HMO | Admitting: Physician Assistant

## 2019-08-24 ENCOUNTER — Ambulatory Visit (HOSPITAL_COMMUNITY)
Admission: RE | Admit: 2019-08-24 | Discharge: 2019-08-24 | Disposition: A | Discharge status: Home / Self Care | Payer: Medicare HMO | Source: Ambulatory Visit | Attending: Surgery | Admitting: Surgery

## 2019-08-24 ENCOUNTER — Other Ambulatory Visit: Payer: Self-pay

## 2019-08-24 VITALS — BP 121/76 | HR 50 | Temp 96.5°F | Resp 20 | Ht 69.0 in | Wt 190.2 lb

## 2019-08-24 DIAGNOSIS — I714 Abdominal aortic aneurysm, without rupture, unspecified: Secondary | ICD-10-CM

## 2019-08-24 DIAGNOSIS — F17200 Nicotine dependence, unspecified, uncomplicated: Secondary | ICD-10-CM

## 2019-08-24 NOTE — Progress Notes (Signed)
Established EVAR   History of Present Illness   [name] is a 69 y.o. (27-Feb-1950) male who presents for routine follow up s/p EVAR (Date: 05/2010) by Dr. Bridgett Barnes.  He denies any new or changing abdominal pain.  He also denies any rest pain or non healing wounds of bilateral lower extremities.  He is in pain management for chronic back pain.  He does have occasional abdominal pain but attributes this to hiatal hernia and esophageal stricture.  He is taking an aspirin daily.  He is a current tobacco smoker with no interest in quitting.   The patient's PMH, PSH, SH, and FamHx were reviewed and are unchanged from prior visit.  Current Outpatient Medications  Medication Sig Dispense Refill  . albuterol (PROAIR HFA) 108 (90 Base) MCG/ACT inhaler Inhale 2 puffs into the lungs every 6 (six) hours as needed. 1 Inhaler 3  . aspirin EC 81 MG tablet Take 81 mg by mouth daily.    . diclofenac sodium (VOLTAREN) 1 % GEL 3 grams to 3 large joints up to 3 times daily (Patient taking differently: Apply 1 application topically daily as needed (for joint pain). ) 3 Tube 3  . esomeprazole (NEXIUM) 40 MG capsule Take 1 capsule (40 mg total) by mouth daily at 12 noon. 30 capsule 3  . fluticasone (FLONASE) 50 MCG/ACT nasal spray PLACE 2 SPRAYS INTO EACH NOSTRIL EVERY DAY 48 mL 0  . losartan-hydrochlorothiazide (HYZAAR) 100-12.5 MG tablet Take one-half tablet daily by mouth (Patient taking differently: Take 0.5 tablets by mouth daily as needed (SBP >130 or headache from high blood pressure). ) 30 tablet 3  . meclizine (ANTIVERT) 25 MG tablet Take 1 tablet (25 mg total) by mouth 3 (three) times daily as needed for dizziness. 30 tablet 0  . Oxycodone HCl 20 MG TABS Take 20 mg by mouth every 6 (six) hours as needed.     . Pediatric Multivit-Minerals-C (KIDS GUMMY BEAR VITAMINS) CHEW Chew 2 each by mouth daily.     . predniSONE (DELTASONE) 10 MG tablet Take 4 tabs po daily x 3 days; then 3 tabs daily x3 days; then 2  tabs daily x3 days; then 1 tab daily x 3 days; then stop 30 tablet 0  . Spacer/Aero-Holding Chambers (AEROCHAMBER MV) inhaler Use as instructed 1 each 0  . SYMBICORT 160-4.5 MCG/ACT inhaler TAKE 2 PUFFS BY MOUTH TWICE A DAY 30.6 Inhaler 0   No current facility-administered medications for this visit.    REVIEW OF SYSTEMS (negative unless checked):   Cardiac:  []  Chest pain or chest pressure? []  Shortness of breath upon activity? []  Shortness of breath when lying flat? []  Irregular heart rhythm?  Vascular:  []  Pain in calf, thigh, or hip brought on by walking? []  Pain in feet at night that wakes you up from your sleep? []  Blood clot in your veins? []  Leg swelling?  Pulmonary:  []  Oxygen at home? []  Productive cough? []  Wheezing?  Neurologic:  []  Sudden weakness in arms or legs? []  Sudden numbness in arms or legs? []  Sudden onset of difficult speaking or slurred speech? []  Temporary loss of vision in one eye? []  Problems with dizziness?  Gastrointestinal:  []  Blood in stool? []  Vomited blood?  Genitourinary:  []  Burning when urinating? []  Blood in urine?  Psychiatric:  []  Major depression  Hematologic:  []  Bleeding problems? []  Problems with blood clotting?  Dermatologic:  []  Rashes or ulcers?  Constitutional:  []  Fever or chills?  Ear/Nose/Throat:  []  Change in hearing? []  Nose bleeds? []  Sore throat?  Musculoskeletal:  []  Back pain? []  Joint pain? []  Muscle pain?   Physical Examination   Vitals:   08/24/19 0821  BP: 121/76  Pulse: 50  Resp: 20  Temp: (!) 96.5 F (35.8 C)  TempSrc: Temporal  SpO2: 97%  Weight: 190 lb 3.2 oz (86.3 kg)  Height: 5\' 9"  (1.753 m)   Body mass index is 28.09 kg/m.  General:  WDWN in NAD; vital signs documented above Gait: Not observed HENT: WNL, normocephalic Pulmonary: normal non-labored breathing , without Rales, rhonchi,  wheezing Cardiac: regular HR Abdomen: soft, NT, no masses Skin: without  rashes Extremities: without ischemic changes, without Gangrene , without cellulitis; without open wounds;  Musculoskeletal: no muscle wasting or atrophy  Neurologic: A&O X 3;  No focal weakness or paresthesias are detected Psychiatric:  The pt has Normal affect.  Non-Invasive Vascular Imaging   EVAR Duplex  AAA sac size: 3.3 cm at largest diameter  no endoleak detected   Medical Decision Making   WILL Barnes is a 69 y.o. male who presents for EVAR surveillance   Based on duplex, sac size is unchanged over the past year and no endoleaks were noted  Recheck EVAR duplex in 1 year per protocol  Continue aspirin daily  Encouraged smoking cessation   William Ligas PA-C [facility] Office: [phone]  Clinic MD: [name]

## 2019-09-21 DIAGNOSIS — E782 Mixed hyperlipidemia: Secondary | ICD-10-CM | POA: Diagnosis not present

## 2019-09-21 DIAGNOSIS — N400 Enlarged prostate without lower urinary tract symptoms: Secondary | ICD-10-CM | POA: Diagnosis not present

## 2019-09-21 DIAGNOSIS — Z125 Encounter for screening for malignant neoplasm of prostate: Secondary | ICD-10-CM | POA: Diagnosis not present

## 2019-09-21 DIAGNOSIS — Z0000 Encounter for general adult medical examination without abnormal findings: Secondary | ICD-10-CM | POA: Diagnosis not present

## 2019-09-21 DIAGNOSIS — I10 Essential (primary) hypertension: Secondary | ICD-10-CM | POA: Diagnosis not present

## 2019-09-21 DIAGNOSIS — Z1389 Encounter for screening for other disorder: Secondary | ICD-10-CM | POA: Diagnosis not present

## 2019-09-21 DIAGNOSIS — K219 Gastro-esophageal reflux disease without esophagitis: Secondary | ICD-10-CM | POA: Diagnosis not present

## 2019-09-21 DIAGNOSIS — I714 Abdominal aortic aneurysm, without rupture: Secondary | ICD-10-CM | POA: Diagnosis not present

## 2019-09-21 DIAGNOSIS — Z6829 Body mass index (BMI) 29.0-29.9, adult: Secondary | ICD-10-CM | POA: Diagnosis not present

## 2019-09-21 DIAGNOSIS — J019 Acute sinusitis, unspecified: Secondary | ICD-10-CM | POA: Diagnosis not present

## 2019-11-12 DIAGNOSIS — H25813 Combined forms of age-related cataract, bilateral: Secondary | ICD-10-CM | POA: Diagnosis not present

## 2019-11-12 DIAGNOSIS — H40013 Open angle with borderline findings, low risk, bilateral: Secondary | ICD-10-CM | POA: Diagnosis not present

## 2019-12-30 DIAGNOSIS — G894 Chronic pain syndrome: Secondary | ICD-10-CM | POA: Diagnosis not present

## 2019-12-30 DIAGNOSIS — Z79891 Long term (current) use of opiate analgesic: Secondary | ICD-10-CM | POA: Diagnosis not present

## 2020-01-01 DIAGNOSIS — H25811 Combined forms of age-related cataract, right eye: Secondary | ICD-10-CM | POA: Diagnosis not present

## 2020-01-16 ENCOUNTER — Telehealth: Payer: Self-pay | Admitting: Pulmonary Disease

## 2020-01-16 NOTE — Telephone Encounter (Signed)
Patient has been lost to follow-up. Please call him and make a follow-up appointment.

## 2020-01-18 NOTE — Telephone Encounter (Signed)
Called pt and scheduled next available appt with Dr. Isaiah Serge.  Nothing further needed at this time- will close encounter.

## 2020-02-11 ENCOUNTER — Other Ambulatory Visit: Payer: Self-pay

## 2020-02-11 ENCOUNTER — Encounter: Payer: Self-pay | Admitting: Pulmonary Disease

## 2020-02-11 ENCOUNTER — Ambulatory Visit (INDEPENDENT_AMBULATORY_CARE_PROVIDER_SITE_OTHER): Payer: Medicare HMO | Admitting: Pulmonary Disease

## 2020-02-11 DIAGNOSIS — J849 Interstitial pulmonary disease, unspecified: Secondary | ICD-10-CM

## 2020-02-11 NOTE — Patient Instructions (Signed)
I am glad that we have connected again regarding your lung issues Since its been 2 years since we had an assessment we will schedule high-resolution CT and PFTs for reevaluation of lung Follow-up in 1 to 2 months.

## 2020-02-11 NOTE — Progress Notes (Signed)
Currently doing               [name]    [id number]    [birth year]  Primary Care Physician:Mann, Carlean Jews, PA-C  Referring Physician: Cory Munch, [place],  [place] [address]  Chief complaint:   Follow-up for emphysema, active smoker, ILD  HPI: 70 year old with history of emphysema, hemoptysis, GERD, active smoker hypertension, coronary artery disease, AAA.  He was previously followed by Dr. Ashok Cordia.  History noted for pulmonary emphysema with no obstruction on PFTs.  History of hemoptysis several years ago which has resolved, no parenchymal abnormalities on chest imaging.  He also has history of severe GERD status post esophageal dilatation in 2016 and 2018.  He continues on antiacid medication.  He was hospitalized in January 2019 for atypical chest pain.  Underwent left heart cath on 02/04/17 which was negative for any abnormality of his coronary arteries.   We advised him to get rid of the [facility].  He has not done so since he is attached to the pet however he is moved it to a different room and is trying to limit his exposure to it. Evaluated by Dr. 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  He is currently not on any immunosuppressive therapy  Underwent bronchoscope with BAL in Feb 2020 showing pneumococcus and Moraxella.  He is completed a course of Augmentin for this Multidisciplinary ILD conference in March 2020 with recommendation for surgical lung biopsy. We have gone back and forth over lung biopsy and he has decided to defer it for now.  Pets: Has a dog, cat, chicken, [place], pheasent, peacock in his yard.  He has a parakeet inside his house. Occupation: Used to work Biomedical engineer.  Currently retired Exposures: Exposure to fumes and dust in his line of work.  No mold, hot tub at home Smoking history: 50-pack-year smoking history.  Continues to smoke half pack per day Travel  History: Used to travel while working.  No significant travel recently.  Interim history: He is back after being lost to follow-up since 2019.  States that breathing is doing well   Outpatient Encounter Medications as of 02/11/2020  Medication Sig  . albuterol (PROAIR HFA) 108 (90 Base) MCG/ACT inhaler Inhale 2 puffs into the lungs every 6 (six) hours as needed.  Marland Kitchen aspirin EC 81 MG tablet Take 81 mg by mouth daily.  . diclofenac sodium (VOLTAREN) 1 % GEL 3 grams to 3 large joints up to 3 times daily (Patient taking differently: Apply 1 application topically daily as needed (for joint pain).)  . esomeprazole (NEXIUM) 40 MG capsule Take 1 capsule (40 mg total) by mouth daily at 12 noon.  . fluticasone (FLONASE) 50 MCG/ACT nasal spray PLACE 2 SPRAYS INTO EACH NOSTRIL EVERY DAY  . losartan-hydrochlorothiazide (HYZAAR) 100-12.5 MG tablet Take one-half tablet daily by mouth (Patient taking differently: Take 0.5 tablets by mouth daily as needed (SBP >130 or headache from high blood pressure).)  . meclizine (ANTIVERT) 25 MG tablet Take 1 tablet (25 mg total) by mouth 3 (three) times daily as needed for dizziness.  . Oxycodone HCl 20 MG TABS Take 20 mg by mouth every 6 (six) hours as needed.   . Pediatric Multivit-Minerals-C (KIDS GUMMY BEAR VITAMINS) CHEW Chew 2 each by mouth daily.   . predniSONE (DELTASONE) 10 MG tablet Take 4 tabs po daily x 3 days; then 3 tabs daily x3 days;  then 2 tabs daily x3 days; then 1 tab daily x 3 days; then stop  . Spacer/Aero-Holding Chambers (AEROCHAMBER MV) inhaler Use as instructed  . SYMBICORT 160-4.5 MCG/ACT inhaler TAKE 2 PUFFS BY MOUTH TWICE A DAY   No facility-administered encounter medications on file as of 02/11/2020.   Physical Exam: Blood pressure 120/72, pulse 76, temperature 97.6 F (36.4 C), temperature source Oral, height _0  (1.778 m), weight 196 lb 6.4 oz (89.1 kg), SpO2 97 %. Gen:      No acute distress HEENT:  EOMI, sclera anicteric Neck:     No  masses; no thyromegaly Lungs:    Clear to auscultation bilaterally; normal respiratory effort CV:         Regular rate and rhythm; no murmurs Abd:      + bowel sounds; soft, non-tender; no palpable masses, no distension Ext:    No edema; adequate peripheral perfusion Skin:      Warm and dry; no rash Neuro: alert and oriented x 3 Psych: normal mood and affect  Data Reviewed: Imaging: CT abdomen pelvis 07/20/10-lung images show subtle basilar atelectasis CT scan 10/13/14-dependent bibasilar atelectasis, left upper lobe 3 mm nodule CT scan 03/31/15- atelectasis, stable left upper lobe 3 mm nodule CT scan 10/18/15- atelectasis, stable left upper lobe 3 mm nodule. Chest x-ray 02/03/17-suspected mild interstitial lung disease in the lower lobes.  High res CT chest 06/04/17-bibasilar fibrosis with mild bronchiectasis.  No honeycombing.  Patchy tree-in-bud opacities in the right upper lobe.  Left main and coronary atheosclerosis High res CT chest 11/21/2017-progression of mild basilar predominant fibrotic lung disease, no honeycombing.  Probable UIP.  Two-vessel coronary atherosclerosis I have reviewed the images personally.  PFTs  12/31/14-FVC 3.44 [98%], FEV1 2.82 [85%], F/F 82, TLC 80%, DLCO 61% Moderate reduction in diffusion capacity.  No obstruction or restriction.  05/17/15-FVC 4.36 [82%], FEV1 3.28 [83%], F/F 75, DLCO 55% Normal spirometry.  Moderate reduction in diffusion capacity  06/12/17- FVC 4.29 [78%], FEV1 2.20 [83%), F/F 75, DLCO 54% Moderate diffusion defect  6MWT 12/31/14- 326 meters / Baseline Sat 96% on RA / Nadir Sat 96% on RA 09/20/17-336 m, baseline sat 98%, nadir sat 98% on room air  Labs 10/29/14 ALPHA-1 AT: MM (145)  Connective tissue serologies 06/05/2017-positive ANA, 1:320 titer, nucleolar pattern Hypersensitivity panel, CCP, Sjogren's panel, ANCA 06/05/2017-negative  Sputum culture 03/02/2017- AFB negative, fungus culture-light growth of yeast Normal oropharyngeal  flora.  Assessment:  Evaluation for interstitial lung disease CT scan reviewed lower lobe reticulation, scarring with no clear evidence of honeycombing This could be from hypersensitivity pneumonitis although the pattern of lower lobe predominance is unusual. Other possibilities include chronic aspiration. He does have a parakeet at home which he is reluctant to get rid of, however he is avoiding significant exposure by moving into a different room. Connective tissue disease serology reviewed with positive ANA.   Differential diagnosis at this point includes chronic hypersensitivity pneumonitis, fibrotic NSIP from connective tissue disease, IPF. Since the treatment is different for each of these we will need a definitve diagnosis.  Bronchoscope with Clabe Seal classifier is negative for UIP fibrosis.   Discussion at multidisciplinary conference in March 2020 with recommendation for surgical lung biopsy.  Patient has been reluctant to do the procedure.  Repeat CT and PFTs for reevaluation  Emphysema PFTs do not show any obstruction.  Continues on Symbicort which he is using intermittently Continues to smoke.  Encouraged smoking cessation.  Subcentimeter pulmonary nodule Has remained stable on follow-up  imaging from 2016 to 2017.  This likely benign.   Follow-up on repeat imaging  Acid reflux, Barrett's esophagus Status post dilation by GI.  Continues on Nexium.    Chronic sinusitis, postnasal drip OTC chlorphentermine and Flonase nasal spray.  Health maintenance Declined vaccination, not vaccinated against COVID  Plan/Recommendations: - Repeat high-res CT, PFTs - Continue Symbicort, smoking cessation  Marshell Garfinkel MD [facility] 02/11/2020, 10:32 AM  CC: Cory Munch, PA-C

## 2020-02-17 ENCOUNTER — Ambulatory Visit (INDEPENDENT_AMBULATORY_CARE_PROVIDER_SITE_OTHER)
Admission: RE | Admit: 2020-02-17 | Discharge: 2020-02-17 | Disposition: A | Discharge status: Home / Self Care | Payer: Medicare HMO | Source: Ambulatory Visit | Attending: Pulmonary Disease | Admitting: Pulmonary Disease

## 2020-02-17 ENCOUNTER — Other Ambulatory Visit: Payer: Self-pay

## 2020-02-17 DIAGNOSIS — J849 Interstitial pulmonary disease, unspecified: Secondary | ICD-10-CM | POA: Diagnosis not present

## 2020-02-17 DIAGNOSIS — J479 Bronchiectasis, uncomplicated: Secondary | ICD-10-CM | POA: Diagnosis not present

## 2020-02-17 DIAGNOSIS — J432 Centrilobular emphysema: Secondary | ICD-10-CM | POA: Diagnosis not present

## 2020-02-17 DIAGNOSIS — J8410 Pulmonary fibrosis, unspecified: Secondary | ICD-10-CM | POA: Diagnosis not present

## 2020-02-17 DIAGNOSIS — I2510 Atherosclerotic heart disease of native coronary artery without angina pectoris: Secondary | ICD-10-CM | POA: Diagnosis not present

## 2020-03-02 DIAGNOSIS — H2512 Age-related nuclear cataract, left eye: Secondary | ICD-10-CM | POA: Diagnosis not present

## 2020-03-04 DIAGNOSIS — H25812 Combined forms of age-related cataract, left eye: Secondary | ICD-10-CM | POA: Diagnosis not present

## 2020-03-25 ENCOUNTER — Other Ambulatory Visit (HOSPITAL_COMMUNITY): Payer: PRIVATE HEALTH INSURANCE

## 2020-03-29 ENCOUNTER — Ambulatory Visit: Payer: PRIVATE HEALTH INSURANCE | Admitting: Pulmonary Disease

## 2020-06-09 ENCOUNTER — Other Ambulatory Visit: Payer: Self-pay

## 2020-06-09 DIAGNOSIS — I714 Abdominal aortic aneurysm, without rupture, unspecified: Secondary | ICD-10-CM

## 2020-07-13 DIAGNOSIS — G894 Chronic pain syndrome: Secondary | ICD-10-CM | POA: Diagnosis not present

## 2020-07-13 DIAGNOSIS — M47818 Spondylosis without myelopathy or radiculopathy, sacral and sacrococcygeal region: Secondary | ICD-10-CM | POA: Diagnosis not present

## 2020-07-13 DIAGNOSIS — M961 Postlaminectomy syndrome, not elsewhere classified: Secondary | ICD-10-CM | POA: Diagnosis not present

## 2020-07-13 DIAGNOSIS — Z79891 Long term (current) use of opiate analgesic: Secondary | ICD-10-CM | POA: Diagnosis not present

## 2020-08-09 ENCOUNTER — Other Ambulatory Visit: Payer: Self-pay | Admitting: Pulmonary Disease

## 2020-08-30 ENCOUNTER — Other Ambulatory Visit (HOSPITAL_COMMUNITY): Payer: Medicare HMO

## 2020-08-30 ENCOUNTER — Ambulatory Visit: Payer: Medicare HMO

## 2020-09-08 DIAGNOSIS — Z79891 Long term (current) use of opiate analgesic: Secondary | ICD-10-CM | POA: Diagnosis not present

## 2020-09-08 DIAGNOSIS — G894 Chronic pain syndrome: Secondary | ICD-10-CM | POA: Diagnosis not present

## 2020-09-13 ENCOUNTER — Ambulatory Visit (INDEPENDENT_AMBULATORY_CARE_PROVIDER_SITE_OTHER): Payer: Medicare HMO | Admitting: Physician Assistant

## 2020-09-13 ENCOUNTER — Other Ambulatory Visit: Payer: Self-pay

## 2020-09-13 ENCOUNTER — Ambulatory Visit (HOSPITAL_COMMUNITY)
Admission: RE | Admit: 2020-09-13 | Discharge: 2020-09-13 | Disposition: A | Discharge status: Home / Self Care | Payer: Medicare HMO | Source: Ambulatory Visit | Attending: Vascular Surgery | Admitting: Vascular Surgery

## 2020-09-13 VITALS — BP 128/81 | HR 52 | Temp 98.0°F | Resp 20 | Ht 70.0 in | Wt 179.6 lb

## 2020-09-13 DIAGNOSIS — I714 Abdominal aortic aneurysm, without rupture, unspecified: Secondary | ICD-10-CM

## 2020-09-13 NOTE — Progress Notes (Signed)
Office Note     CC:  follow up Requesting Provider:  Cory Munch, PA-C  HPI: [name] is a 70 y.o. (1950-06-04) male who presents for routine surveillance follow up of EVAR. This was done in May of 2012 by Dr. Bridgett Larsson. He denies any new or changing abdominal pain.  He also denies any claudication, rest pain or non healing wounds of bilateral lower extremities.  He is in pain management for chronic back pain.  He does have occasional abdominal pain but attributes this to hiatal hernia and esophageal stricture.   The patient's PMH, PSH, SH, and FamHx were reviewed and are unchanged from prior visit  The pt is not  on a statin for cholesterol management.  The pt is on a daily aspirin.   Other AC:  none The pt is on ARB/HCTZ for hypertension.   The pt is not diabetic.   Tobacco hx:  current, 1 ppd  Past Medical History:  Diagnosis Date   AAA (abdominal aortic aneurysm) ([place])    a. s/p EVAR 05/2010.   Back pain    CKD (chronic kidney disease), stage II    Degenerative disk disease    a. Chronic back pain with ridiculopathy.   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 10m nodule seen on CT 09/2014 - f/u recommended 1 yr.   Skin cancer    Tobacco abuse     Past Surgical History:  Procedure Laterality Date   ABDOMINAL AORTIC ENDOVASCULAR STENT GRAFT  2014   APPENDECTOMY     BACK SURGERY     BIOPSY N/A 06/10/2013   Procedure: BIOPSY;  Surgeon: NRogene Houston MD;  [facility]: [facility];  Service: Endoscopy;  Laterality: N/A;   BIOPSY  12/03/2014   Procedure: BIOPSY (Gastroesophageal Junction);  Surgeon: NRogene Houston MD;  [facility];  Service: Endoscopy;;   ESOPHAGEAL DILATION N/A 12/03/2014   Procedure: ESOPHAGEAL DILATION WITH 54FR AND 56FR MALONEY;  Surgeon: NRogene Houston MD;  [facility];  Service: Endoscopy;  Laterality: N/A;   ESOPHAGOGASTRODUODENOSCOPY (EGD) WITH PROPOFOL N/A 06/10/2013   Procedure: ESOPHAGOGASTRODUODENOSCOPY (EGD) WITH PROPOFOL;  Surgeon: NRogene Houston MD;  [facility];  Service: Endoscopy;  Laterality: N/A;   ESOPHAGOGASTRODUODENOSCOPY (EGD) WITH PROPOFOL N/A 12/03/2014   Procedure: ESOPHAGOGASTRODUODENOSCOPY (EGD) WITH PROPOFOL;  Surgeon: NRogene Houston MD;  [facility];  Service: Endoscopy;  Laterality: N/A;   EVAR  06/20/10   for AAA repair    LEFT HEART CATH AND CORONARY ANGIOGRAPHY N/A 02/04/2017   Procedure: LEFT HEART CATH AND CORONARY ANGIOGRAPHY;  Surgeon: BLorretta Harp MD;  [facility];  Service: Cardiovascular;  Laterality: N/A;   MALONEY DILATION N/A 06/10/2013   Procedure: MALONEY DILATION 52 fr, 510fr;  Surgeon: NRogene Houston MD;  [facility];  Service: Endoscopy;  Laterality: N/A;   SPINAL FUSION     of neck and lower back   VIDEO BRONCHOSCOPY Bilateral 02/24/2018   Procedure: VIDEO BRONCHOSCOPY WITH FLUORO;  Surgeon: MMarshell Garfinkel MD;  [facility]  Service: Cardiopulmonary;  Laterality: Bilateral;    Social History   Socioeconomic History   Marital status: Married    Spouse name: Not  on file   Number of children: Not on file   Years of education: Not on file   Highest education level: Not on file  Occupational History   Not on file  Tobacco Use   Smoking status: Every Day    Packs/day: 1.00    Years: 48.00    Pack years: 48.00    Types: Cigarettes    Start date: 12/16/1965   Smokeless tobacco: Never  Vaping Use   Vaping Use: Never used  Substance and Sexual Activity   Alcohol use: No    Alcohol/week: 0.0 standard drinks   Drug use: No   Sexual activity: Not on file  Other Topics Concern   Not on file  Social History Narrative   Originally from [place], [place]. Always lived in [place]. Travel to the [place] in March 2016. Has traveled to [place],  [place], [place], & [place]. 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 No known asbestos exposure. Has 2 parakeets currently. Remotely raised parakeets, love birds, & cockatiels in a different house. No known mold or hot tub exposure.    Social Determinants of Health   Financial Resource Strain: Not on file  Food Insecurity: Not on file  Transportation Needs: Not on file  Physical Activity: Not on file  Stress: Not on file  Social Connections: Not on file  Intimate Partner Violence: Not on file    Family History  Problem Relation Age of Onset   Atrial fibrillation Mother    Heart disease Mother        Patient unclear of what kind   Deep vein thrombosis Mother    Hyperlipidemia Mother    Hypertension Mother    Stroke Father    Diabetes Father    Heart disease Father        Patient unclear of what kind   Kidney disease Father    Hyperlipidemia Father    Hypertension Father    Diabetes Sister    Heart disease Sister        Patient unclear of what kind   Brain cancer Sister    Seizures Sister    Deep vein thrombosis Other    Pulmonary embolism Other    Cancer Paternal Grandmother    Lung disease Neg Hx    Colon cancer Neg Hx    Esophageal cancer Neg Hx    Rectal cancer Neg Hx    Stomach cancer Neg Hx     Current Outpatient Medications  Medication Sig Dispense Refill   albuterol (PROAIR HFA) 108 (90 Base) MCG/ACT inhaler Inhale 2 puffs into the lungs every 6 (six) hours as needed. 1 Inhaler 3   aspirin EC 81 MG tablet Take 81 mg by mouth daily.     esomeprazole (NEXIUM) 40 MG capsule Take 1 capsule (40 mg total) by mouth daily at 12 noon. 30 capsule 3   losartan-hydrochlorothiazide (HYZAAR) 100-12.5 MG tablet Take one-half tablet daily by mouth (Patient taking differently: Take 0.5 tablets by mouth daily as needed (SBP >130 or headache from high blood pressure).) 30 tablet 3   Oxycodone HCl 10 MG  TABS Take 10 mg by mouth 5 (five) times daily as needed.     Pediatric Multivit-Minerals-C (KIDS GUMMY BEAR VITAMINS) CHEW Chew 2 each by mouth daily.      Spacer/Aero-Holding Chambers (AEROCHAMBER MV) inhaler Use as instructed 1 each 0   SYMBICORT 160-4.5 MCG/ACT inhaler TAKE 2  PUFFS BY MOUTH TWICE A DAY 30.6 Inhaler 0   diclofenac sodium (VOLTAREN) 1 % GEL 3 grams to 3 large joints up to 3 times daily (Patient not taking: Reported on 09/13/2020) 3 Tube 3   fluticasone (FLONASE) 50 MCG/ACT nasal spray PLACE 2 SPRAYS INTO EACH NOSTRIL EVERY DAY (Patient not taking: Reported on 09/13/2020) 48 mL 0   meclizine (ANTIVERT) 25 MG tablet Take 1 tablet (25 mg total) by mouth 3 (three) times daily as needed for dizziness. (Patient not taking: Reported on 09/13/2020) 30 tablet 0   predniSONE (DELTASONE) 10 MG tablet Take 4 tabs po daily x 3 days; then 3 tabs daily x3 days; then 2 tabs daily x3 days; then 1 tab daily x 3 days; then stop (Patient not taking: Reported on 09/13/2020) 30 tablet 0   No current facility-administered medications for this visit.    Allergies  Allergen Reactions   Benazepril Hcl Shortness Of Breath and Other (See Comments)    Can't breathe   Pantoprazole Sodium Shortness Of Breath and Other (See Comments)    Can't breathe   Amlodipine Swelling and Other (See Comments)    Location of swelling not recalled by the patient     REVIEW OF SYSTEMS:  '[X]'$  denotes positive finding, '[ ]'$  denotes negative finding Cardiac  Comments:  Chest pain or chest pressure:    Shortness of breath upon exertion:    Short of breath when lying flat:    Irregular heart rhythm:        Vascular    Pain in calf, thigh, or hip brought on by ambulation:    Pain in feet at night that wakes you up from your sleep:     Blood clot in your veins:    Leg swelling:         Pulmonary    Oxygen at home:    Productive cough:     Wheezing:         Neurologic    Sudden weakness in arms or legs:     Sudden  numbness in arms or legs:     Sudden onset of difficulty speaking or slurred speech:    Temporary loss of vision in one eye:     Problems with dizziness:         Gastrointestinal    Blood in stool:     Vomited blood:         Genitourinary    Burning when urinating:     Blood in urine:        Psychiatric    Major depression:         Hematologic    Bleeding problems:    Problems with blood clotting too easily:        Skin    Rashes or ulcers:        Constitutional    Fever or chills:      PHYSICAL EXAMINATION:  Vitals:   09/13/20 0832  BP: 128/81  Pulse: (!) 52  Resp: 20  Temp: 98 F (36.7 C)  TempSrc: Temporal  SpO2: 96%  Weight: 179 lb 9.6 oz (81.5 kg)  Height: '5\' 10"'$  (1.778 m)    General:  WDWN in NAD; vital signs documented above Gait: Normal HENT: WNL, normocephalic Pulmonary: normal non-labored breathing , without wheezing Cardiac: regular HR, without  Murmurs without carotid bruit Abdomen: soft, NT, no masses. No palpable AAA.  Vascular Exam/Pulses:  Right Left  Radial 2+ (normal) 2+ (normal)  Femoral 2+ (  normal) 2+ (normal)  Popliteal Not palpable Not palpable  DP 2+ (normal) 2+ (normal)  PT Not palpable Not palpable   Extremities: without ischemic changes, without Gangrene , without cellulitis; without open wounds;  Musculoskeletal: no muscle wasting or atrophy  Neurologic: A&O X 3;  No focal weakness or paresthesias are detected Psychiatric:  The pt has Normal affect.   Non-Invasive Vascular Imaging:    Endovascular Aortic Repair (EVAR):  +----------+----------------+-------------------+-------------------+            Diameter AP (cm)Diameter Trans (cm)Velocities (cm/sec)  +----------+----------------+-------------------+-------------------+  Aorta     3.15            3.35               65                   +----------+----------------+-------------------+-------------------+  Right Limb1.24            1.26               120                   +----------+----------------+-------------------+-------------------+  Left Limb 1.00            1.20               84                   +----------+----------------+-------------------+-------------------+   Summary:  Abdominal Aorta: Patent endovascular aneurysm repair with no evidence of endoleak. Largest diameter measurement is 3.35 cm. The largest aortic diameter remains essentially unchanged compared to prior exam (3.32 cm). Previous diameter measurement was obtained on 08/24/2019.    ASSESSMENT/PLAN:: 70 y.o. male here for follow up for EVAR. This was done in May of 2012 by Dr. Bridgett Larsson. He is without any abdominal pain or new back pain. He has no lower extremity claudication, rest pain or tissue loss. His duplex today shows stable aneurysm size with no endoleak. Greatest diameter is 3.35 cm. He will continue his Aspirin. He will follow up in 1 year with EVAR duplex  Karoline Caldwell, PA-C [facility] [phone]  Clinic MD:   Roxanne Mins

## 2020-10-12 DIAGNOSIS — M5136 Other intervertebral disc degeneration, lumbar region: Secondary | ICD-10-CM | POA: Diagnosis not present

## 2020-10-12 DIAGNOSIS — Z981 Arthrodesis status: Secondary | ICD-10-CM | POA: Diagnosis not present

## 2020-10-12 DIAGNOSIS — M47816 Spondylosis without myelopathy or radiculopathy, lumbar region: Secondary | ICD-10-CM | POA: Diagnosis not present

## 2020-11-01 ENCOUNTER — Other Ambulatory Visit: Payer: Self-pay | Admitting: Pulmonary Disease

## 2020-12-07 NOTE — Telephone Encounter (Signed)
errror

## 2020-12-30 DIAGNOSIS — Z79891 Long term (current) use of opiate analgesic: Secondary | ICD-10-CM | POA: Diagnosis not present

## 2020-12-30 DIAGNOSIS — M961 Postlaminectomy syndrome, not elsewhere classified: Secondary | ICD-10-CM | POA: Diagnosis not present

## 2020-12-30 DIAGNOSIS — G894 Chronic pain syndrome: Secondary | ICD-10-CM | POA: Diagnosis not present

## 2020-12-30 DIAGNOSIS — M47818 Spondylosis without myelopathy or radiculopathy, sacral and sacrococcygeal region: Secondary | ICD-10-CM | POA: Diagnosis not present

## 2021-07-10 ENCOUNTER — Encounter: Payer: Self-pay | Admitting: Gastroenterology

## 2021-08-17 DIAGNOSIS — M961 Postlaminectomy syndrome, not elsewhere classified: Secondary | ICD-10-CM | POA: Diagnosis not present

## 2021-08-17 DIAGNOSIS — M461 Sacroiliitis, not elsewhere classified: Secondary | ICD-10-CM | POA: Diagnosis not present

## 2021-08-17 DIAGNOSIS — G894 Chronic pain syndrome: Secondary | ICD-10-CM | POA: Diagnosis not present

## 2021-08-17 DIAGNOSIS — M47818 Spondylosis without myelopathy or radiculopathy, sacral and sacrococcygeal region: Secondary | ICD-10-CM | POA: Diagnosis not present

## 2021-10-16 DIAGNOSIS — M461 Sacroiliitis, not elsewhere classified: Secondary | ICD-10-CM | POA: Diagnosis not present

## 2021-10-16 DIAGNOSIS — G894 Chronic pain syndrome: Secondary | ICD-10-CM | POA: Diagnosis not present

## 2021-10-16 DIAGNOSIS — Z79891 Long term (current) use of opiate analgesic: Secondary | ICD-10-CM | POA: Diagnosis not present

## 2021-10-16 DIAGNOSIS — M961 Postlaminectomy syndrome, not elsewhere classified: Secondary | ICD-10-CM | POA: Diagnosis not present

## 2021-10-16 DIAGNOSIS — M47818 Spondylosis without myelopathy or radiculopathy, sacral and sacrococcygeal region: Secondary | ICD-10-CM | POA: Diagnosis not present

## 2021-11-20 ENCOUNTER — Encounter: Payer: Self-pay | Admitting: Gastroenterology

## 2021-12-05 ENCOUNTER — Encounter (INDEPENDENT_AMBULATORY_CARE_PROVIDER_SITE_OTHER): Payer: Self-pay | Admitting: Gastroenterology

## 2022-01-03 ENCOUNTER — Other Ambulatory Visit: Payer: Self-pay | Admitting: *Deleted

## 2022-01-03 DIAGNOSIS — Z9889 Other specified postprocedural states: Secondary | ICD-10-CM

## 2022-01-05 NOTE — Progress Notes (Unsigned)
Office Note     CC:  follow up Requesting Provider:  Cory Munch, PA-C  HPI: [name] is a 71 y.o. (Nov 14, 1950) male who presents for routine follow up of EVAR. This was done in May of 2012 by Dr. Bridgett Larsson. He denies any new or changing abdominal pain. He does have occasional abdominal pain but attributes this to hiatal hernia and esophageal stricture. Reports short lasting sharp pains intermittently but he attributes this to gas.  He is in pain management for chronic back pain. He explains that his back often causes pain and numbness in his legs. This is worse on ambulation up inclines. The numbness and pain radiates from his low back down back of his legs to knees. Occasionally reports numbness/ pain in his feet at high but he gets up and moves and it goes away. He denies any claudication, rest pain or non healing wounds of bilateral lower extremities.    The pt is not on a statin for cholesterol management.  The pt is on a daily aspirin.   Other AC:  none The pt is on ARB/HCTZ for hypertension.   The pt is not diabetic.   Tobacco hx:  current, 1 ppd  Past Medical History:  Diagnosis Date   AAA (abdominal aortic aneurysm) ([place])    a. s/p EVAR 05/2010.   Back pain    Barrett's esophagus    CKD (chronic kidney disease), stage II    Degenerative disk disease    a. Chronic back pain with ridiculopathy.   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 52m nodule seen on CT 09/2014 - f/u recommended 1 yr.   Skin cancer    Tobacco abuse     Past Surgical History:  Procedure Laterality Date   ABDOMINAL AORTIC ENDOVASCULAR STENT GRAFT  2014   APPENDECTOMY     BACK SURGERY     BIOPSY N/A 06/10/2013   Procedure: BIOPSY;  Surgeon: NRogene Houston MD;  [facility];  Service: Endoscopy;   Laterality: N/A;   BIOPSY  12/03/2014   Procedure: BIOPSY (Gastroesophageal Junction);  Surgeon: NRogene Houston MD;  [facility];  Service: Endoscopy;;   ESOPHAGEAL DILATION N/A 12/03/2014   Procedure: ESOPHAGEAL DILATION WITH 54FR AND 56FR MALONEY;  Surgeon: NRogene Houston MD;  [facility];  Service: Endoscopy;  Laterality: N/A;   ESOPHAGOGASTRODUODENOSCOPY (EGD) WITH PROPOFOL N/A 06/10/2013   Procedure: ESOPHAGOGASTRODUODENOSCOPY (EGD) WITH PROPOFOL;  Surgeon: NRogene Houston MD;  [facility];  Service: Endoscopy;  Laterality: N/A;   ESOPHAGOGASTRODUODENOSCOPY (EGD) WITH PROPOFOL N/A 12/03/2014   Procedure: ESOPHAGOGASTRODUODENOSCOPY (EGD) WITH PROPOFOL;  Surgeon: NRogene Houston MD;  [facility];  Service: Endoscopy;  Laterality: N/A;   EVAR  06/20/10   for AAA repair    LEFT HEART CATH AND CORONARY ANGIOGRAPHY N/A 02/04/2017   Procedure: LEFT HEART CATH AND CORONARY ANGIOGRAPHY;  Surgeon: BLorretta Harp MD;  [facility];  Service: Cardiovascular;  Laterality: N/A;   MALONEY DILATION N/A 06/10/2013   Procedure: MALONEY DILATION 52 fr, 564fr;  Surgeon: NRogene Houston MD;  [facility];  Service: Endoscopy;  Laterality: N/A;   SPINAL FUSION     of  neck and lower back   VIDEO BRONCHOSCOPY Bilateral 02/24/2018   Procedure: VIDEO BRONCHOSCOPY WITH FLUORO;  Surgeon: Marshell Garfinkel, MD;  Location: [facility];  Service: Cardiopulmonary;  Laterality: Bilateral;    Social History   Socioeconomic History   Marital status: Married    Spouse name: Not on file   Number of children: Not on file   Years of education: Not on file   Highest education level: Not on file  Occupational History   Not on file  Tobacco Use   Smoking status: Every Day    Packs/day: 1.00    Years: 48.00    Total pack years: 48.00    Types: Cigarettes    Start date: 12/16/1965   Smokeless tobacco: Never  Vaping Use   Vaping Use: Never used  Substance and Sexual  Activity   Alcohol use: No    Alcohol/week: 0.0 standard drinks of alcohol   Drug use: No   Sexual activity: Not on file  Other Topics Concern   Not on file  Social History Narrative   Originally from [place], [place]. Always lived in [place]. Travel to the [place] in March 2016. Has traveled to [place], [place], [place], & [place]. 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 No known asbestos exposure. Has 2 parakeets currently. Remotely raised parakeets, love birds, & cockatiels in a different house. No known mold or hot tub exposure.    Social Determinants of Health   Financial Resource Strain: Not on file  Food Insecurity: Not on file  Transportation Needs: Not on file  Physical Activity: Not on file  Stress: Not on file  Social Connections: Not on file  Intimate Partner Violence: Not on file    Family History  Problem Relation Age of Onset   Atrial fibrillation Mother    Heart disease Mother        Patient unclear of what kind   Deep vein thrombosis Mother    Hyperlipidemia Mother    Hypertension Mother    Stroke Father    Diabetes Father    Heart disease Father        Patient unclear of what kind   Kidney disease Father    Hyperlipidemia Father    Hypertension Father    Diabetes Sister    Heart disease Sister        Patient unclear of what kind   Brain cancer Sister    Seizures Sister    Deep vein thrombosis Other    Pulmonary embolism Other    Cancer Paternal Grandmother    Lung disease Neg Hx    Colon cancer Neg Hx    Esophageal cancer Neg Hx    Rectal cancer Neg Hx    Stomach cancer Neg Hx     Current Outpatient Medications  Medication Sig Dispense Refill   albuterol (PROAIR HFA) 108 (90 Base) MCG/ACT inhaler Inhale 2 puffs into the lungs every 6 (six) hours as needed. 1 Inhaler 3   aspirin EC 81 MG tablet Take 81 mg by mouth daily.     losartan-hydrochlorothiazide (HYZAAR) 100-12.5 MG tablet  Take one-half tablet daily by mouth (Patient taking differently: Take 0.5 tablets by mouth daily as needed (SBP >130 or headache from high blood pressure).) 30 tablet 3   meclizine (ANTIVERT) 25 MG tablet Take 1 tablet (25 mg total) by mouth 3 (three) times daily as needed for dizziness. 30 tablet 0  Oxycodone HCl 10 MG TABS Take 10 mg by mouth 5 (five) times daily as needed.     Pediatric Multivit-Minerals-C (KIDS GUMMY BEAR VITAMINS) CHEW Chew 2 each by mouth daily.      SYMBICORT 160-4.5 MCG/ACT inhaler TAKE 2 PUFFS BY MOUTH TWICE A DAY 30.6 Inhaler 0   diclofenac sodium (VOLTAREN) 1 % GEL 3 grams to 3 large joints up to 3 times daily (Patient not taking: Reported on 09/13/2020) 3 Tube 3   esomeprazole (NEXIUM) 40 MG capsule Take 1 capsule (40 mg total) by mouth daily at 12 noon. (Patient not taking: Reported on 01/08/2022) 30 capsule 3   fluticasone (FLONASE) 50 MCG/ACT nasal spray PLACE 2 SPRAYS INTO EACH NOSTRIL EVERY DAY (Patient not taking: Reported on 01/08/2022) 48 mL 1   predniSONE (DELTASONE) 10 MG tablet Take 4 tabs po daily x 3 days; then 3 tabs daily x3 days; then 2 tabs daily x3 days; then 1 tab daily x 3 days; then stop (Patient not taking: Reported on 09/13/2020) 30 tablet 0   Spacer/Aero-Holding Chambers (AEROCHAMBER MV) inhaler Use as instructed (Patient not taking: Reported on 01/08/2022) 1 each 0   No current facility-administered medications for this visit.    Allergies  Allergen Reactions   Benazepril Hcl Shortness Of Breath and Other (See Comments)    Can't breathe   Pantoprazole Sodium Shortness Of Breath and Other (See Comments)    Can't breathe   Amlodipine Swelling and Other (See Comments)    Location of swelling not recalled by the patient     REVIEW OF SYSTEMS:  '[X]'$  denotes positive finding, '[ ]'$  denotes negative finding Cardiac  Comments:  Chest pain or chest pressure:    Shortness of breath upon exertion:    Short of breath when lying flat:    Irregular  heart rhythm:        Vascular    Pain in calf, thigh, or hip brought on by ambulation:    Pain in feet at night that wakes you up from your sleep:     Blood clot in your veins:    Leg swelling:         Pulmonary    Oxygen at home:    Productive cough:     Wheezing:         Neurologic    Sudden weakness in arms or legs:     Sudden numbness in arms or legs:     Sudden onset of difficulty speaking or slurred speech:    Temporary loss of vision in one eye:     Problems with dizziness:         Gastrointestinal    Blood in stool:     Vomited blood:         Genitourinary    Burning when urinating:     Blood in urine:        Psychiatric    Major depression:         Hematologic    Bleeding problems:    Problems with blood clotting too easily:        Skin    Rashes or ulcers:        Constitutional    Fever or chills:      PHYSICAL EXAMINATION:  Vitals:   01/08/22 0825  BP: 131/83  Pulse: 67  Resp: 16  Temp: 97.9 F (36.6 C)  TempSrc: Temporal  SpO2: 96%  Weight: 169 lb (76.7 kg)  Height: '5\' 9"'$  (1.753 m)  General:  WDWN in NAD; vital signs documented above Gait: Normal HENT: WNL, normocephalic Pulmonary: normal non-labored breathing , without wheezing Cardiac: regular HR, without  Murmurs without carotid bruit Abdomen: soft, NT, no masses. No palpable AAA Vascular Exam/Pulses:  Right Left  Radial 2+ (normal) 2+ (normal)  Femoral 2+ (normal) 2+ (normal)  Popliteal Not palpable Not palpable  DP 2+ (normal) 2+ (normal)  PT 1+ (weak) 1+ (weak)   Extremities: without ischemic changes, without Gangrene , without cellulitis; without open wounds;  Musculoskeletal: no muscle wasting or atrophy  Neurologic: A&O X 3;  No focal weakness or paresthesias are detected Psychiatric:  The pt has Normal affect.   Non-Invasive Vascular Imaging:   Endovascular Aortic Repair (EVAR):  +----------+----------------+-------------------+-------------------+            Diameter AP (cm)Diameter Trans (cm)Velocities (cm/sec)  +----------+----------------+-------------------+-------------------+  Aorta    3.84            4.57               114                  +----------+----------------+-------------------+-------------------+  Right Limb1.26            1.46               53                   +----------+----------------+-------------------+-------------------+  Left Limb 1.29            1.26               100                  +----------+----------------+-------------------+-------------------+   Summary:  Abdominal Aorta: Patent endovascular aneurysm repair with no evidence of  endoleak. Increase in size when compared to prior study, largest diameter currently 4.57cm, previously 3.35 cm.    ASSESSMENT/PLAN:: 71 y.o. male here for follow up for EVAR. This was done in May of 2012 by Dr. Bridgett Larsson. He is without any associated symptoms. He has chronic back pain but this is unchanged.  - Discussed smoking cessation but he is not interested in quitting at this time - continue adequate blood pressure control - Continue Aspirin 81 mg daily -Due to increase in maximum diameter of AAA from prior duplex, I have recommended CTA evaluation to evaluate for endoleak - Will arrange this in the near future and have patient follow up with Dr. Carlis Abbott to review study   Karoline Caldwell, PA-C [facility] [phone]  Clinic MD:  Virl Cagey

## 2022-01-08 ENCOUNTER — Ambulatory Visit (HOSPITAL_COMMUNITY)
Admission: RE | Admit: 2022-01-08 | Discharge: 2022-01-08 | Disposition: A | Discharge status: Home / Self Care | Payer: Medicare Other | Source: Ambulatory Visit | Attending: Surgery | Admitting: Surgery

## 2022-01-08 ENCOUNTER — Ambulatory Visit (INDEPENDENT_AMBULATORY_CARE_PROVIDER_SITE_OTHER): Payer: Medicare Other | Admitting: Physician Assistant

## 2022-01-08 VITALS — BP 131/83 | HR 67 | Temp 97.9°F | Resp 16 | Ht 69.0 in | Wt 169.0 lb

## 2022-01-08 DIAGNOSIS — Z9889 Other specified postprocedural states: Secondary | ICD-10-CM | POA: Diagnosis not present

## 2022-01-08 DIAGNOSIS — I714 Abdominal aortic aneurysm, without rupture, unspecified: Secondary | ICD-10-CM

## 2022-01-12 ENCOUNTER — Other Ambulatory Visit (HOSPITAL_COMMUNITY): Payer: Self-pay | Admitting: Family Medicine

## 2022-01-12 DIAGNOSIS — R590 Localized enlarged lymph nodes: Secondary | ICD-10-CM | POA: Diagnosis not present

## 2022-01-18 ENCOUNTER — Ambulatory Visit (HOSPITAL_COMMUNITY)
Admission: RE | Admit: 2022-01-18 | Discharge: 2022-01-18 | Disposition: A | Discharge status: Home / Self Care | Payer: Medicare Other | Source: Ambulatory Visit | Attending: Family Medicine | Admitting: Family Medicine

## 2022-01-18 DIAGNOSIS — R590 Localized enlarged lymph nodes: Secondary | ICD-10-CM | POA: Diagnosis not present

## 2022-01-24 ENCOUNTER — Other Ambulatory Visit: Payer: Self-pay

## 2022-01-24 DIAGNOSIS — I714 Abdominal aortic aneurysm, without rupture, unspecified: Secondary | ICD-10-CM

## 2022-02-06 ENCOUNTER — Ambulatory Visit (HOSPITAL_COMMUNITY)
Admission: RE | Admit: 2022-02-06 | Discharge: 2022-02-06 | Disposition: A | Discharge status: Home / Self Care | Payer: 59 | Source: Ambulatory Visit | Attending: Vascular Surgery | Admitting: Vascular Surgery

## 2022-02-06 DIAGNOSIS — I714 Abdominal aortic aneurysm, without rupture, unspecified: Secondary | ICD-10-CM

## 2022-02-06 DIAGNOSIS — I723 Aneurysm of iliac artery: Secondary | ICD-10-CM | POA: Diagnosis not present

## 2022-02-06 DIAGNOSIS — N281 Cyst of kidney, acquired: Secondary | ICD-10-CM | POA: Diagnosis not present

## 2022-02-06 LAB — POCT I-STAT CREATININE: Creatinine, Ser: 1.4 mg/dL — ABNORMAL HIGH (ref 0.61–1.24)

## 2022-02-06 MED ORDER — IOHEXOL 350 MG/ML SOLN
100.0000 mL | Freq: Once | INTRAVENOUS | Status: AC | PRN
  Administered 2022-02-06: 100 mL via INTRAVENOUS

## 2022-02-12 ENCOUNTER — Telehealth: Payer: Self-pay

## 2022-02-12 NOTE — Telephone Encounter (Signed)
Pt called with c/o sharp stomach pain since the CT was done on 1/16 and today his legs feel weak.  Spoke with Sam, [place] who reviewed the CT report and advised that he keep his appt on 1/23. She didn't see anything in the report that was alarming from a vascular standpoint. Reviewed pt's chart, returned call for clarification, two identifiers used. Relayed advice from PA and reassured him. Instructed him to stay well hydrated and take Tylenol for pain. Pt states pain has improved. Confirmed understanding.

## 2022-02-13 ENCOUNTER — Ambulatory Visit (INDEPENDENT_AMBULATORY_CARE_PROVIDER_SITE_OTHER): Payer: 59 | Admitting: Vascular Surgery

## 2022-02-13 ENCOUNTER — Encounter: Payer: Self-pay | Admitting: Vascular Surgery

## 2022-02-13 VITALS — BP 155/82 | HR 63 | Temp 98.0°F | Resp 16 | Ht 68.0 in | Wt 174.0 lb

## 2022-02-13 DIAGNOSIS — I7143 Infrarenal abdominal aortic aneurysm, without rupture: Secondary | ICD-10-CM

## 2022-02-13 DIAGNOSIS — I723 Aneurysm of iliac artery: Secondary | ICD-10-CM | POA: Diagnosis not present

## 2022-02-13 NOTE — Progress Notes (Signed)
Patient name: [name] MRN: [id number] DOB: 1950/08/26 Sex: male  REASON FOR CONSULT: Concern for enlarging AAA after previous stent graft repair  HPI: [name] is a 72 y.o. male, with history of EVAR in 2012 by Dr. Bridgett Larsson for an abdominal aortic aneurysm.  He was recently seen in the [facility] clinic in December 2023 with an enlarging aneurysm from 3.3 to 4.5 cm on duplex in the abdominal aorta with no endoleak.  He was then sent for CTA and scheduled to follow-up with me.  He reports no new abdominal or back pain.  Still smoking.  Past Medical History:  Diagnosis Date   AAA (abdominal aortic aneurysm) ([place])    a. s/p EVAR 05/2010.   Back pain    Barrett's esophagus    CKD (chronic kidney disease), stage II    Degenerative disk disease    a. Chronic back pain with ridiculopathy.   Diverticulosis    Emphysema of lung ([place])    Essential hypertension    GERD (gastroesophageal reflux disease)    Hiatal hernia    Hyperlipidemia    Neck rigidity    from cervical fusion- patient cannot turn head   Pulmonary nodule    a. 77m nodule seen on CT 09/2014 - f/u recommended 1 yr.   Skin cancer    Tobacco abuse     Past Surgical History:  Procedure Laterality Date   ABDOMINAL AORTIC ENDOVASCULAR STENT GRAFT  2014   APPENDECTOMY     BACK SURGERY     BIOPSY N/A 06/10/2013   Procedure: BIOPSY;  Surgeon: NRogene Houston MD;  Location: [facility];  Service: Endoscopy;  Laterality: N/A;   BIOPSY  12/03/2014   Procedure: BIOPSY (Gastroesophageal Junction);  Surgeon: NRogene Houston MD;  Location: [facility];  Service: Endoscopy;;   ESOPHAGEAL DILATION N/A 12/03/2014   Procedure: ESOPHAGEAL DILATION WITH 54FR AND 56FR MALONEY;  Surgeon: NRogene Houston MD;  Location: [facility];  Service: Endoscopy;  Laterality: N/A;   ESOPHAGOGASTRODUODENOSCOPY (EGD) WITH PROPOFOL N/A 06/10/2013   Procedure: ESOPHAGOGASTRODUODENOSCOPY (EGD) WITH PROPOFOL;  Surgeon: NRogene Houston MD;  Location: [facility];  Service:  Endoscopy;  Laterality: N/A;   ESOPHAGOGASTRODUODENOSCOPY (EGD) WITH PROPOFOL N/A 12/03/2014   Procedure: ESOPHAGOGASTRODUODENOSCOPY (EGD) WITH PROPOFOL;  Surgeon: NRogene Houston MD;  Location: [facility];  Service: Endoscopy;  Laterality: N/A;   EVAR  06/20/10   for AAA repair    LEFT HEART CATH AND CORONARY ANGIOGRAPHY N/A 02/04/2017   Procedure: LEFT HEART CATH AND CORONARY ANGIOGRAPHY;  Surgeon: BLorretta Harp MD;  [facility];  Service: Cardiovascular;  Laterality: N/A;   MALONEY DILATION N/A 06/10/2013   Procedure: MALONEY DILATION 52 fr, 565fr;  Surgeon: NRogene Houston MD;  Location: [facility];  Service: Endoscopy;  Laterality: N/A;   SPINAL FUSION     of neck and lower back   VIDEO BRONCHOSCOPY Bilateral 02/24/2018   Procedure: VIDEO BRONCHOSCOPY WITH FLUORO;  Surgeon: MMarshell Garfinkel MD;  [facility]  Service: Cardiopulmonary;  Laterality: Bilateral;    Family History  Problem Relation Age of Onset   Atrial fibrillation Mother    Heart disease Mother        Patient unclear of what kind   Deep vein thrombosis Mother    Hyperlipidemia Mother    Hypertension Mother    Stroke Father    Diabetes Father    Heart disease Father  Patient unclear of what kind   Kidney disease Father    Hyperlipidemia Father    Hypertension Father    Diabetes Sister    Heart disease Sister        Patient unclear of what kind   Brain cancer Sister    Seizures Sister    Deep vein thrombosis Other    Pulmonary embolism Other    Cancer Paternal Grandmother    Lung disease Neg Hx    Colon cancer Neg Hx    Esophageal cancer Neg Hx    Rectal cancer Neg Hx    Stomach cancer Neg Hx     SOCIAL HISTORY: Social History   Socioeconomic History   Marital status: Married    Spouse name: Not on file   Number of children: Not on file   Years of education: Not on file   Highest education level: Not on file  Occupational History   Not on file  Tobacco Use    Smoking status: Every Day    Packs/day: 1.00    Years: 48.00    Total pack years: 48.00    Types: Cigarettes    Start date: 12/16/1965   Smokeless tobacco: Never  Vaping Use   Vaping Use: Never used  Substance and Sexual Activity   Alcohol use: No    Alcohol/week: 0.0 standard drinks of alcohol   Drug use: No   Sexual activity: Not on file  Other Topics Concern   Not on file  Social History Narrative   Originally from [place], [place]. Always lived in [place]. Travel to the [place] in March 2016. Has traveled to [place], [place], [place], & [place]. 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 No known asbestos exposure. Has 2 parakeets currently. Remotely raised parakeets, love birds, & cockatiels in a different house. No known mold or hot tub exposure.    Social Determinants of Health   Financial Resource Strain: Not on file  Food Insecurity: Not on file  Transportation Needs: Not on file  Physical Activity: Not on file  Stress: Not on file  Social Connections: Not on file  Intimate Partner Violence: Not on file    Allergies  Allergen Reactions   Benazepril Hcl Shortness Of Breath and Other (See Comments)    Can't breathe   Pantoprazole Sodium Shortness Of Breath and Other (See Comments)    Can't breathe   Amlodipine Swelling and Other (See Comments)    Location of swelling not recalled by the patient    Current Outpatient Medications  Medication Sig Dispense Refill   albuterol (PROAIR HFA) 108 (90 Base) MCG/ACT inhaler Inhale 2 puffs into the lungs every 6 (six) hours as needed. 1 Inhaler 3   aspirin EC 81 MG tablet Take 81 mg by mouth daily.     meclizine (ANTIVERT) 25 MG tablet Take 1 tablet (25 mg total) by mouth 3 (three) times daily as needed for dizziness. 30 tablet 0   Oxycodone HCl 10 MG TABS Take 10 mg by mouth 5 (five) times daily as needed.     Pediatric Multivit-Minerals-C (KIDS GUMMY BEAR  VITAMINS) CHEW Chew 2 each by mouth daily.      diclofenac sodium (VOLTAREN) 1 % GEL 3 grams to 3 large joints up to 3 times daily (Patient not taking: Reported on 09/13/2020) 3 Tube 3   esomeprazole (NEXIUM) 40 MG capsule Take 1 capsule (40 mg total) by  mouth daily at 12 noon. (Patient not taking: Reported on 01/08/2022) 30 capsule 3   fluticasone (FLONASE) 50 MCG/ACT nasal spray PLACE 2 SPRAYS INTO EACH NOSTRIL EVERY DAY (Patient not taking: Reported on 01/08/2022) 48 mL 1   losartan-hydrochlorothiazide (HYZAAR) 100-12.5 MG tablet Take one-half tablet daily by mouth (Patient taking differently: Take 0.5 tablets by mouth daily as needed (SBP >130 or headache from high blood pressure).) 30 tablet 3   predniSONE (DELTASONE) 10 MG tablet Take 4 tabs po daily x 3 days; then 3 tabs daily x3 days; then 2 tabs daily x3 days; then 1 tab daily x 3 days; then stop (Patient not taking: Reported on 09/13/2020) 30 tablet 0   Spacer/Aero-Holding Chambers (AEROCHAMBER MV) inhaler Use as instructed (Patient not taking: Reported on 01/08/2022) 1 each 0   SYMBICORT 160-4.5 MCG/ACT inhaler TAKE 2 PUFFS BY MOUTH TWICE A DAY (Patient not taking: Reported on 02/13/2022) 30.6 Inhaler 0   No current facility-administered medications for this visit.    REVIEW OF SYSTEMS:  '[X]'$  denotes positive finding, '[ ]'$  denotes negative finding Cardiac  Comments:  Chest pain or chest pressure:    Shortness of breath upon exertion:    Short of breath when lying flat:    Irregular heart rhythm:        Vascular    Pain in calf, thigh, or hip brought on by ambulation:    Pain in feet at night that wakes you up from your sleep:     Blood clot in your veins:    Leg swelling:         Pulmonary    Oxygen at home:    Productive cough:     Wheezing:         Neurologic    Sudden weakness in arms or legs:     Sudden numbness in arms or legs:     Sudden onset of difficulty speaking or slurred speech:    Temporary loss of vision in one  eye:     Problems with dizziness:         Gastrointestinal    Blood in stool:     Vomited blood:         Genitourinary    Burning when urinating:     Blood in urine:        Psychiatric    Major depression:         Hematologic    Bleeding problems:    Problems with blood clotting too easily:        Skin    Rashes or ulcers:        Constitutional    Fever or chills:      PHYSICAL EXAM: Vitals:   02/13/22 1409  BP: (!) 155/82  Pulse: 63  Resp: 16  Temp: 98 F (36.7 C)  TempSrc: Temporal  SpO2: 95%  Weight: 174 lb (78.9 kg)  Height: '5\' 8"'$  (1.727 m)    GENERAL: The patient is a well-nourished male, in no acute distress. The vital signs are documented above. CARDIAC: There is a regular rate and rhythm.  VASCULAR:  Bilateral femoral pulses palpable Bilateral DP pulses palpable PULMONARY: No respiratory distress. ABDOMEN: Soft and non-tender. MUSCULOSKELETAL: There are no major deformities or cyanosis. NEUROLOGIC: No focal weakness or paresthesias are detected. PSYCHIATRIC: The patient has a normal affect.  DATA:   CLINICAL DATA:  Follow-up abdominal aortic aneurysm. History of endovascular repair of abdominal aortic aneurysm.   EXAM: CTA ABDOMEN AND PELVIS  WITHOUT AND WITH CONTRAST   TECHNIQUE: Multidetector CT imaging of the abdomen and pelvis was performed using the standard protocol during bolus administration of intravenous contrast. Multiplanar reconstructed images and MIPs were obtained and reviewed to evaluate the vascular anatomy.   RADIATION DOSE REDUCTION: This exam was performed according to the departmental dose-optimization program which includes automated exposure control, adjustment of the mA and/or kV according to patient size and/or use of iterative reconstruction technique.   CONTRAST:  195m OMNIPAQUE IOHEXOL 350 MG/ML SOLN   COMPARISON:  CTA 01/25/2016 and chest CT 02/17/2020 and chest CT 06/04/2017   FINDINGS: VASCULAR    Aorta: Endovascular repair of the abdominal aortic aneurysm with a bifurcated infrarenal aortic stent graft. Stable position of the aortic stent graft. The aortic stent graft and both limbs are widely patent. Very small native aortic aneurysm sac. Maximum dimension of the aortic sac including both endovascular limbs measures up to 2.9 cm and stable. No evidence for an aortic endoleak. Focal dilatation along the left juxtarenal abdominal aorta measures roughly 0.7 cm. Aorta at this level measures up to 2.7 cm on image 44/7 and previously measured 2.5 cm. No evidence for an aortic dissection. Distal descending thoracic aorta measures approximately 2.7 cm.   Celiac: Celiac trunk is patent without significant stenosis. Diffuse dilatation of the celiac trunk measuring up to 1.0 cm and minimally changed. Atherosclerotic plaque in the distal celiac artery trunk. Main branch vessels are patent.   SMA: Patent without evidence of aneurysm, dissection, vasculitis or significant stenosis.   Renals: Right renal artery is patent without aneurysm or dissection or significant stenosis. Mild narrowing near the origin left renal artery with an early branch vessel. No evidence for aneurysm or dissection involving the left renal artery.   IMA: Occluded at the origin.  Distal reconstitution of the IMA.   Inflow: New aneurysmal dilatation involving the mid and distal right common iliac artery. Mid/distal right common iliac artery measures up to 2.3 cm near the distal end of the stent. The stent is not excluding this aneurysm. This aneurysm sac is new since 2018. Right internal and right external iliac arteries are patent without significant stenosis or dissection. Left endograft limb terminates in the mid left common iliac artery. The distal left common iliac artery measures 1.4 cm and previously measured 1.3 cm. Left internal and left external iliac arteries are patent. Focal dilatation of the left  mid external iliac artery measuring up to 1.3 cm with a small amount of plaque in this area.   Proximal Outflow: Proximal femoral arteries are patent bilaterally.   Veins: IVC and renal veins are patent. Normal appearance of the iliac veins. Main portal venous system is patent.   Review of the MIP images confirms the above findings.   NON-VASCULAR   Lower chest: Peripheral 4 mm nodule in the right lower lobe on image 56/12 was probably present on the previous CT from 2022. New peripheral cyst or bleb formation in the posterior left lower lobe measures 1.3 cm on image 38/12. Focal parenchymal densities just caudal to this peripheral cystic structure. Again noted is bronchiectasis in lower lobes. Patchy ground-glass opacities in both lungs particularly in the lower lobes. Slightly increased densities or volume loss in the medial right lower lobe compared to the previous chest CT. 8 mm nodule in the right lower lobe adjacent to the right major fissure on image 34/12 is stable since 2019. No pleural effusions.   Hepatobiliary: Normal appearance of the liver, gallbladder and  portal venous system.   Pancreas: Unremarkable. No pancreatic ductal dilatation or surrounding inflammatory changes.   Spleen: Normal in size without focal abnormality.   Adrenals/Urinary Tract: Normal adrenal glands. Negative for kidney stones. Small left renal cysts that do not require dedicated follow-up. No suspicious renal lesions. Normal urinary bladder. No ureter dilatation.   Stomach/Bowel: Normal appearance of the stomach. No bowel dilatation or obstruction. Multiple colonic diverticula particularly in the sigmoid colon. Minimal stranding around a sigmoid diverticulum on image 110/7. This could represent mild diverticulitis and age indeterminate.   Lymphatic: No lymph node enlargement in the abdomen or pelvis.   Reproductive: Prostate is unremarkable.   Other: Left inguinal hernia containing  fat. Negative for free fluid. Negative for free air.   Musculoskeletal: Posterior interbody fusion at L4, L5 and S1. No acute bone abnormality.   IMPRESSION: VASCULAR   1. 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 Aneurysm measures up to 2.3 cm. 2. Bifurcated aortic stent graft is patent. No change in the aortic aneurysm sac. No evidence for an endoleak involving the aortic aneurysm sac. 3. Slight enlargement of the juxtarenal abdominal aorta with focal protrusion along the left side of the abdominal aorta near the left renal artery. Juxtarenal abdominal aorta measures up to 2.7 cm and previously measured 2.5 cm.   NON-VASCULAR   1. Colonic diverticulosis with mild stranding around a sigmoid diverticulum. This could represent mild diverticulitis and age-indeterminate. Recommend clinical correlation. 2. Chronic lung changes with bronchiectasis and patchy ground-glass densities. Findings compatible with known interstitial lung disease. There is a new peripheral cyst or bleb in the left lower lobe as described.   These results will be called to the ordering clinician or representative by the Radiologist Assistant, and communication documented in the PACS or [employer].     Electronically Signed   By: Markus Daft M.D.   On: 02/07/2022 12:24  Assessment/Plan:  72 year old male presents for follow-up after CTA for further evaluation of a suspected enlarging abdominal aortic aneurysm after previous stent graft repair in 2012 by Dr. Bridgett Larsson.  I reviewed with the patient that his stent graft is in good position with no endoleak within the abdominal aneurysm.  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  The distal limb of the stent graft has no seal here but there is really no  significant endoleak into the aneurysm sac up top as there is seal in the more proximal limb.  I discussed that typically iliac aneurysms are repaired at 3 to 3.5 cm.  I think we can safely observe this for now.  I will see him in 6 months with EVAR duplex and we will specifically look at the size of his right common iliac artery aneurysm.  If this continues to grow we could likely treat this with coil embolization of the hypo and stent graft.   Marty Heck, MD [facility] Office: [phone]

## 2022-02-15 ENCOUNTER — Other Ambulatory Visit: Payer: Self-pay

## 2022-02-15 DIAGNOSIS — I7143 Infrarenal abdominal aortic aneurysm, without rupture: Secondary | ICD-10-CM

## 2022-02-15 DIAGNOSIS — I714 Abdominal aortic aneurysm, without rupture, unspecified: Secondary | ICD-10-CM

## 2022-04-10 DIAGNOSIS — Z79891 Long term (current) use of opiate analgesic: Secondary | ICD-10-CM | POA: Diagnosis not present

## 2022-04-10 DIAGNOSIS — G894 Chronic pain syndrome: Secondary | ICD-10-CM | POA: Diagnosis not present

## 2022-04-10 DIAGNOSIS — M47818 Spondylosis without myelopathy or radiculopathy, sacral and sacrococcygeal region: Secondary | ICD-10-CM | POA: Diagnosis not present

## 2022-04-10 DIAGNOSIS — M961 Postlaminectomy syndrome, not elsewhere classified: Secondary | ICD-10-CM | POA: Diagnosis not present

## 2022-04-10 DIAGNOSIS — M461 Sacroiliitis, not elsewhere classified: Secondary | ICD-10-CM | POA: Diagnosis not present

## 2022-05-07 ENCOUNTER — Telehealth: Payer: Self-pay

## 2022-05-07 NOTE — Telephone Encounter (Signed)
Caller: Patient  Concern: abdominal pain, lower back pain, and BL groin pain, worse on R side, pt concerned of AAA rupture  Description:  ongoing for 1-1/2 yrs, but has changed from intermittent to constant x 2-3 days  Quality: aching  Resolution: Appointment scheduled for next available for fasting [place] & Dr. Chestine Spore on same day and Instructed to call back if symptoms perist  Next Appt: Appointment scheduled for 05/29/22, fasting [place] instructions reviewed with pt

## 2022-05-29 ENCOUNTER — Ambulatory Visit (INDEPENDENT_AMBULATORY_CARE_PROVIDER_SITE_OTHER): Payer: 59 | Admitting: Vascular Surgery

## 2022-05-29 ENCOUNTER — Ambulatory Visit (HOSPITAL_COMMUNITY)
Admission: RE | Admit: 2022-05-29 | Discharge: 2022-05-29 | Disposition: A | Discharge status: Home / Self Care | Payer: 59 | Source: Ambulatory Visit | Attending: Vascular Surgery | Admitting: Vascular Surgery

## 2022-05-29 ENCOUNTER — Encounter: Payer: Self-pay | Admitting: Vascular Surgery

## 2022-05-29 VITALS — BP 131/81 | HR 53 | Temp 97.8°F | Resp 16 | Ht 69.0 in | Wt 175.0 lb

## 2022-05-29 DIAGNOSIS — I714 Abdominal aortic aneurysm, without rupture, unspecified: Secondary | ICD-10-CM | POA: Diagnosis not present

## 2022-05-29 DIAGNOSIS — I7143 Infrarenal abdominal aortic aneurysm, without rupture: Secondary | ICD-10-CM

## 2022-05-29 NOTE — Progress Notes (Signed)
Patient name: [name] MRN: [id number] DOB: 1950/12/09 Sex: male  REASON FOR CONSULT: 6 month follow-up EVAR surveillance  HPI: [name] is a 72 y.o. male, with history of EVAR in 2012 by Dr. Imogene Burn for an abdominal aortic aneurysm that presents for 6 month follow-up.  He was previously seen in the [facility] clinic in December 2023 with an enlarging aneurysm from 3.3 to 4.5 cm on duplex in the abdominal aorta with no endoleak.  He was then sent for CTA and scheduled to follow-up with me. I reviewed with the patient that his stent graft was in good position with no endoleak within the abdominal aneurysm.  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  The distal limb of the stent graft has no seal here but there is really no significant endoleak into the aneurysm sac up top as there is seal in the more proximal limb.    States he has some intermittent right-sided abdominal pain that comes and goes.  Still smoking.  Past Medical History:  Diagnosis Date   AAA (abdominal aortic aneurysm) (HCC)    a. s/p EVAR 05/2010.   Back pain    Barrett's esophagus    CKD (chronic kidney disease), stage II    Degenerative disk disease    a. Chronic back pain with ridiculopathy.   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 3mm nodule seen on CT 09/2014 - f/u recommended 1 yr.   Skin cancer    Tobacco abuse     Past Surgical History:  Procedure Laterality Date   ABDOMINAL AORTIC ENDOVASCULAR STENT GRAFT  2014   APPENDECTOMY     BACK SURGERY     BIOPSY N/A 06/10/2013   Procedure: BIOPSY;  Surgeon: Malissa Hippo, MD;  Location: [facility];  Service:  Endoscopy;  Laterality: N/A;   BIOPSY  12/03/2014   Procedure: BIOPSY (Gastroesophageal Junction);  Surgeon: Malissa Hippo, MD;  Location: [facility];  Service: Endoscopy;;   ESOPHAGEAL DILATION N/A 12/03/2014   Procedure: ESOPHAGEAL DILATION WITH 54FR AND 56FR MALONEY;  Surgeon: Malissa Hippo, MD;  Location: [facility];  Service: Endoscopy;  Laterality: N/A;   ESOPHAGOGASTRODUODENOSCOPY (EGD) WITH PROPOFOL N/A 06/10/2013   Procedure: ESOPHAGOGASTRODUODENOSCOPY (EGD) WITH PROPOFOL;  Surgeon: Malissa Hippo, MD;  Location: [facility];  Service: Endoscopy;  Laterality: N/A;   ESOPHAGOGASTRODUODENOSCOPY (EGD) WITH PROPOFOL N/A 12/03/2014   Procedure: ESOPHAGOGASTRODUODENOSCOPY (EGD) WITH PROPOFOL;  Surgeon: Malissa Hippo, MD;  Location: [facility];  Service: Endoscopy;  Laterality: N/A;   EVAR  06/20/10   for AAA repair    LEFT HEART CATH AND CORONARY ANGIOGRAPHY N/A 02/04/2017   Procedure: LEFT HEART CATH AND CORONARY ANGIOGRAPHY;  Surgeon: Runell Gess, MD;  [facility]: [facility];  Service: Cardiovascular;  Laterality: N/A;   MALONEY DILATION N/A 06/10/2013   Procedure: MALONEY DILATION 52 fr, 54 fr;  Surgeon: Malissa Hippo, MD;  Location: [facility];  Service: Endoscopy;  Laterality: N/A;   SPINAL FUSION     of neck and lower  back   VIDEO BRONCHOSCOPY Bilateral 02/24/2018   Procedure: VIDEO BRONCHOSCOPY WITH FLUORO;  Surgeon: Chilton Greathouse, MD;  Location: [facility];  Service: Cardiopulmonary;  Laterality: Bilateral;    Family History  Problem Relation Age of Onset   Atrial fibrillation Mother    Heart disease Mother        Patient unclear of what kind   Deep vein thrombosis Mother    Hyperlipidemia Mother    Hypertension Mother    Stroke Father    Diabetes Father    Heart disease Father        Patient unclear of what kind   Kidney disease Father    Hyperlipidemia Father    Hypertension Father    Diabetes Sister    Heart disease Sister        Patient unclear of what kind    Brain cancer Sister    Seizures Sister    Deep vein thrombosis Other    Pulmonary embolism Other    Cancer Paternal Grandmother    Lung disease Neg Hx    Colon cancer Neg Hx    Esophageal cancer Neg Hx    Rectal cancer Neg Hx    Stomach cancer Neg Hx     SOCIAL HISTORY: Social History   Socioeconomic History   Marital status: Married    Spouse name: Not on file   Number of children: Not on file   Years of education: Not on file   Highest education level: Not on file  Occupational History   Not on file  Tobacco Use   Smoking status: Every Day    Packs/day: 1.00    Years: 48.00    Additional pack years: 0.00    Total pack years: 48.00    Types: Cigarettes    Start date: 12/16/1965   Smokeless tobacco: Never  Vaping Use   Vaping Use: Never used  Substance and Sexual Activity   Alcohol use: No    Alcohol/week: 0.0 standard drinks of alcohol   Drug use: No   Sexual activity: Not on file  Other Topics Concern   Not on file  Social History Narrative   Originally from [place], [place]. Always lived in [place]. Travel to the [place] in March 2016. Has traveled to [place], [place], [place], & [place]. 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 No known asbestos exposure. Has 2 parakeets currently. Remotely raised parakeets, love birds, & cockatiels in a different house. No known mold or hot tub exposure.    Social Determinants of Health   Financial Resource Strain: Not on file  Food Insecurity: Not on file  Transportation Needs: Not on file  Physical Activity: Not on file  Stress: Not on file  Social Connections: Not on file  Intimate Partner Violence: Not on file    Allergies  Allergen Reactions   Benazepril Hcl Shortness Of Breath and Other (See Comments)    Can't breathe   Pantoprazole Sodium Shortness Of Breath and Other (See Comments)    Can't breathe   Amlodipine Swelling and Other (See Comments)     Location of swelling not recalled by the patient    Current Outpatient Medications  Medication Sig Dispense Refill   albuterol (PROAIR HFA) 108 (90 Base) MCG/ACT inhaler Inhale 2 puffs into the lungs every 6 (six) hours as needed. 1 Inhaler 3   aspirin EC 81 MG tablet Take 81 mg by mouth  daily.     losartan-hydrochlorothiazide (HYZAAR) 100-12.5 MG tablet Take one-half tablet daily by mouth (Patient taking differently: Take 0.5 tablets by mouth daily as needed (SBP >130 or headache from high blood pressure).) 30 tablet 3   meclizine (ANTIVERT) 25 MG tablet Take 1 tablet (25 mg total) by mouth 3 (three) times daily as needed for dizziness. 30 tablet 0   Oxycodone HCl 10 MG TABS Take 10 mg by mouth 5 (five) times daily as needed.     Pediatric Multivit-Minerals-C (KIDS GUMMY BEAR VITAMINS) CHEW Chew 2 each by mouth daily.      SYMBICORT 160-4.5 MCG/ACT inhaler TAKE 2 PUFFS BY MOUTH TWICE A DAY 30.6 Inhaler 0   diclofenac sodium (VOLTAREN) 1 % GEL 3 grams to 3 large joints up to 3 times daily (Patient not taking: Reported on 09/13/2020) 3 Tube 3   esomeprazole (NEXIUM) 40 MG capsule Take 1 capsule (40 mg total) by mouth daily at 12 noon. (Patient not taking: Reported on 01/08/2022) 30 capsule 3   fluticasone (FLONASE) 50 MCG/ACT nasal spray PLACE 2 SPRAYS INTO EACH NOSTRIL EVERY DAY (Patient not taking: Reported on 01/08/2022) 48 mL 1   predniSONE (DELTASONE) 10 MG tablet Take 4 tabs po daily x 3 days; then 3 tabs daily x3 days; then 2 tabs daily x3 days; then 1 tab daily x 3 days; then stop (Patient not taking: Reported on 09/13/2020) 30 tablet 0   Spacer/Aero-Holding Chambers (AEROCHAMBER MV) inhaler Use as instructed (Patient not taking: Reported on 01/08/2022) 1 each 0   No current facility-administered medications for this visit.    REVIEW OF SYSTEMS:  [X]  denotes positive finding, [ ]  denotes negative finding Cardiac  Comments:  Chest pain or chest pressure:    Shortness of breath upon  exertion:    Short of breath when lying flat:    Irregular heart rhythm:        Vascular    Pain in calf, thigh, or hip brought on by ambulation:    Pain in feet at night that wakes you up from your sleep:     Blood clot in your veins:    Leg swelling:         Pulmonary    Oxygen at home:    Productive cough:     Wheezing:         Neurologic    Sudden weakness in arms or legs:     Sudden numbness in arms or legs:     Sudden onset of difficulty speaking or slurred speech:    Temporary loss of vision in one eye:     Problems with dizziness:         Gastrointestinal    Blood in stool:     Vomited blood:         Genitourinary    Burning when urinating:     Blood in urine:        Psychiatric    Major depression:         Hematologic    Bleeding problems:    Problems with blood clotting too easily:        Skin    Rashes or ulcers:        Constitutional    Fever or chills:      PHYSICAL EXAM: Vitals:   05/29/22 0932  BP: 131/81  Pulse: (!) 53  Resp: 16  Temp: 97.8 F (36.6 C)  TempSrc: Temporal  SpO2: 94%  Weight: 175 lb (79.4  kg)  Height: 5\' 9"  (1.753 m)    GENERAL: The patient is a well-nourished male, in no acute distress. The vital signs are documented above. CARDIAC: There is a regular rate and rhythm.  VASCULAR:  Bilateral femoral pulses palpable Bilateral DP pulses palpable No lower extremity tissue loss PULMONARY: No respiratory distress. ABDOMEN: Soft and non-tender. MUSCULOSKELETAL: There are no major deformities or cyanosis. NEUROLOGIC: No focal weakness or paresthesias are detected. PSYCHIATRIC: The patient has a normal affect.  DATA:   EVAR duplex today shows patent aneurysm repair with no endoleak.  There is dilation of the right common iliac artery distally measuring up to 2.6 cm today increased from approximately 2.3 cm on CT 6 months ago.  Assessment/Plan:  72 year old male presents for 6 month follow-up of his stent graft after  repair in 2012 by Dr. Imogene Burn.  Again discussed no evidence of endoleak.  The endovascular aneurysm repair remains patent.  We are monitoring aneurysmal dilation of the right common iliac artery at the distal extent of the limb of the endograft noted on CT.  This last measured 2.3 cm approximately 6 months ago and is now up to 2.6 cm today by duplex.  Discussed these are typically repaired at greater than 3.5 cm.  We will continue to monitor.  No indication for surgical intervention at this time.  Will repeat a EVAR duplex in 6 months.   Cephus Shelling, MD [facility] Office: [phone]

## 2022-06-11 ENCOUNTER — Other Ambulatory Visit: Payer: Self-pay

## 2022-06-11 DIAGNOSIS — I7143 Infrarenal abdominal aortic aneurysm, without rupture: Secondary | ICD-10-CM

## 2022-06-12 DIAGNOSIS — M47818 Spondylosis without myelopathy or radiculopathy, sacral and sacrococcygeal region: Secondary | ICD-10-CM | POA: Diagnosis not present

## 2022-06-12 DIAGNOSIS — M461 Sacroiliitis, not elsewhere classified: Secondary | ICD-10-CM | POA: Diagnosis not present

## 2022-06-12 DIAGNOSIS — M25552 Pain in left hip: Secondary | ICD-10-CM | POA: Diagnosis not present

## 2022-06-12 DIAGNOSIS — M961 Postlaminectomy syndrome, not elsewhere classified: Secondary | ICD-10-CM | POA: Diagnosis not present

## 2022-06-13 ENCOUNTER — Ambulatory Visit
Admission: RE | Admit: 2022-06-13 | Discharge: 2022-06-13 | Disposition: A | Discharge status: Home / Self Care | Payer: 59 | Source: Ambulatory Visit | Attending: Anesthesiology | Admitting: Anesthesiology

## 2022-06-13 ENCOUNTER — Other Ambulatory Visit: Payer: Self-pay | Admitting: Anesthesiology

## 2022-06-13 DIAGNOSIS — M25559 Pain in unspecified hip: Secondary | ICD-10-CM | POA: Diagnosis not present

## 2022-06-13 DIAGNOSIS — M1990 Unspecified osteoarthritis, unspecified site: Secondary | ICD-10-CM | POA: Diagnosis not present

## 2022-06-13 DIAGNOSIS — M25552 Pain in left hip: Secondary | ICD-10-CM

## 2022-07-11 DIAGNOSIS — M1612 Unilateral primary osteoarthritis, left hip: Secondary | ICD-10-CM | POA: Diagnosis not present

## 2022-07-11 DIAGNOSIS — M25552 Pain in left hip: Secondary | ICD-10-CM | POA: Diagnosis not present

## 2022-07-11 DIAGNOSIS — M47818 Spondylosis without myelopathy or radiculopathy, sacral and sacrococcygeal region: Secondary | ICD-10-CM | POA: Diagnosis not present

## 2022-07-11 DIAGNOSIS — M461 Sacroiliitis, not elsewhere classified: Secondary | ICD-10-CM | POA: Diagnosis not present

## 2022-07-22 ENCOUNTER — Emergency Department (HOSPITAL_COMMUNITY)
Admission: EM | Admit: 2022-07-22 | Discharge: 2022-07-22 | Disposition: A | Discharge status: Home / Self Care | Payer: 59 | Attending: Emergency Medicine | Admitting: Emergency Medicine

## 2022-07-22 ENCOUNTER — Other Ambulatory Visit: Payer: Self-pay

## 2022-07-22 ENCOUNTER — Encounter (HOSPITAL_COMMUNITY): Payer: Self-pay

## 2022-07-22 ENCOUNTER — Emergency Department (HOSPITAL_COMMUNITY): Payer: 59

## 2022-07-22 DIAGNOSIS — I129 Hypertensive chronic kidney disease with stage 1 through stage 4 chronic kidney disease, or unspecified chronic kidney disease: Secondary | ICD-10-CM | POA: Insufficient documentation

## 2022-07-22 DIAGNOSIS — M7652 Patellar tendinitis, left knee: Secondary | ICD-10-CM | POA: Diagnosis not present

## 2022-07-22 DIAGNOSIS — M25562 Pain in left knee: Secondary | ICD-10-CM | POA: Diagnosis not present

## 2022-07-22 DIAGNOSIS — N183 Chronic kidney disease, stage 3 unspecified: Secondary | ICD-10-CM | POA: Diagnosis not present

## 2022-07-22 DIAGNOSIS — Z7982 Long term (current) use of aspirin: Secondary | ICD-10-CM | POA: Diagnosis not present

## 2022-07-22 MED ORDER — OXYCODONE-ACETAMINOPHEN 5-325 MG PO TABS
2.0000 | ORAL_TABLET | Freq: Once | ORAL | Status: AC
  Administered 2022-07-22: 2 via ORAL
  Filled 2022-07-22: qty 2

## 2022-07-22 NOTE — ED Triage Notes (Signed)
Pt states he stepped off a step and pain radiated from left knee down to toes. No swelling or deformity noted to knee

## 2022-07-22 NOTE — ED Provider Notes (Signed)
[place] EMERGENCY DEPARTMENT AT [facility] Provider Note  CSN: [id number] Arrival date & time: 07/22/22 1757  Chief Complaint(s) Knee Pain  HPI [name] is a 72 y.o. male with history of CKD, emphysema, hyperlipidemia presents to the emergency for left knee pain.  Patient reports he stepped on a step, developed left knee pain.  He reports initially was not able to ambulate but has not been able to ambulate subsequently.  He reports he took some of his home oxycodone.  No numbness or tingling.  No other injuries.   Past Medical History Past Medical History:  Diagnosis Date   AAA (abdominal aortic aneurysm) (HCC)    a. s/p EVAR 05/2010.   Back pain    Barrett's esophagus    CKD (chronic kidney disease), stage II    Degenerative disk disease    a. Chronic back pain with ridiculopathy.   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 3mm nodule seen on CT 09/2014 - f/u recommended 1 yr.   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   ILD (interstitial lung disease) (HCC)    Primary osteoarthritis of both hands 11/22/2017   Primary osteoarthritis of right knee 11/22/2017   DDD (degenerative disc disease), cervical 11/22/2017   Atypical chest pain 02/03/2017   Acute URI 12/05/2016   Cough 11/09/2015   Asthmatic bronchitis with acute exacerbation 08/03/2015   Tobacco use disorder 05/17/2015   Emphysema of lung (HCC) 10/29/2014   CKD (chronic kidney disease), stage II    Pulmonary nodule    Dysphagia 05/07/2013   GERD (gastroesophageal reflux disease) 05/07/2013   Chest pain 09/06/2012   Abdominal aneurysm without mention of rupture 10/20/2010   Preop cardiovascular exam 06/01/2010    Cigarette smoker 06/01/2010   AAA (abdominal aortic aneurysm) (HCC) 06/01/2010   HTN (hypertension) 06/01/2010   Home Medication(s) Prior to Admission medications   Medication Sig Start Date End Date Taking? Authorizing Provider  albuterol (PROAIR HFA) 108 (90 Base) MCG/ACT inhaler Inhale 2 puffs into the lungs every 6 (six) hours as needed. 05/26/18   [name], Colbert Coyer, MD  aspirin EC 81 MG tablet Take 81 mg by mouth daily.    [provider]  diclofenac sodium (VOLTAREN) 1 % GEL 3 grams to 3 large joints up to 3 times daily Patient not taking: Reported on 09/13/2020 12/03/17   Pollyann Savoy, MD  esomeprazole (NEXIUM) 40 MG capsule Take 1 capsule (40 mg total) by mouth daily at 12 noon. Patient not taking: Reported on 01/08/2022 02/16/19   Tawni Pummel B, PA-C  fluticasone [facility]) 50 MCG/ACT nasal spray PLACE 2 SPRAYS INTO EACH NOSTRIL EVERY DAY Patient not taking: Reported on 01/08/2022 11/02/20   Chilton Greathouse, MD  losartan-hydrochlorothiazide (HYZAAR) 100-12.5 MG tablet Take one-half tablet daily by mouth Patient taking differently: Take 0.5 tablets by mouth daily as needed (SBP >130 or headache from high blood pressure). 12/30/14   Tonny Bollman, MD  meclizine (ANTIVERT) 25 MG tablet Take 1 tablet (25 mg total) by mouth 3 (three) times daily as needed for dizziness. 09/25/17   Melene Plan, DO  Oxycodone HCl 10  MG TABS Take 10 mg by mouth 5 (five) times daily as needed. 09/08/20   [provider]  Pediatric Multivit-Minerals-C (KIDS GUMMY BEAR VITAMINS) CHEW Chew 2 each by mouth daily.     [provider]  predniSONE (DELTASONE) 10 MG tablet Take 4 tabs po daily x 3 days; then 3 tabs daily x3 days; then 2 tabs daily x3 days; then 1 tab daily x 3 days; then stop Patient not taking: Reported on 09/13/2020 06/09/18   Glenford Bayley, NP  Spacer/Aero-Holding Chambers (AEROCHAMBER MV) inhaler Use as instructed Patient not taking: Reported on 01/08/2022 11/09/15    Roslynn Amble, MD  SYMBICORT 160-4.5 MCG/ACT inhaler TAKE 2 PUFFS BY MOUTH TWICE A DAY 05/21/19   Chilton Greathouse, MD                                                                                                                                    Past Surgical History Past Surgical History:  Procedure Laterality Date   ABDOMINAL AORTIC ENDOVASCULAR STENT GRAFT  2014   APPENDECTOMY     BACK SURGERY     BIOPSY N/A 06/10/2013   Procedure: BIOPSY;  Surgeon: Malissa Hippo, MD;  Location: [facility];  Service: Endoscopy;  Laterality: N/A;   BIOPSY  12/03/2014   Procedure: BIOPSY (Gastroesophageal Junction);  Surgeon: Malissa Hippo, MD;  Location: [facility];  Service: Endoscopy;;   ESOPHAGEAL DILATION N/A 12/03/2014   Procedure: ESOPHAGEAL DILATION WITH 54FR AND 56FR MALONEY;  Surgeon: Malissa Hippo, MD;  Location: [facility];  Service: Endoscopy;  Laterality: N/A;   ESOPHAGOGASTRODUODENOSCOPY (EGD) WITH PROPOFOL N/A 06/10/2013   Procedure: ESOPHAGOGASTRODUODENOSCOPY (EGD) WITH PROPOFOL;  Surgeon: Malissa Hippo, MD;  Location: [facility];  Service: Endoscopy;  Laterality: N/A;   ESOPHAGOGASTRODUODENOSCOPY (EGD) WITH PROPOFOL N/A 12/03/2014   Procedure: ESOPHAGOGASTRODUODENOSCOPY (EGD) WITH PROPOFOL;  Surgeon: Malissa Hippo, MD;  Location: [facility];  Service: Endoscopy;  Laterality: N/A;   EVAR  06/20/10   for AAA repair    LEFT HEART CATH AND CORONARY ANGIOGRAPHY N/A 02/04/2017   Procedure: LEFT HEART CATH AND CORONARY ANGIOGRAPHY;  Surgeon: Runell Gess, MD;  [facility];  Service: Cardiovascular;  Laterality: N/A;   MALONEY DILATION N/A 06/10/2013   Procedure: MALONEY DILATION 52 fr, 54 fr;  Surgeon: Malissa Hippo, MD;  Location: [facility];  Service: Endoscopy;  Laterality: N/A;   SPINAL FUSION     of neck and lower back   VIDEO BRONCHOSCOPY Bilateral 02/24/2018   Procedure: VIDEO BRONCHOSCOPY WITH FLUORO;  Surgeon: Chilton Greathouse, MD;  Location: [facility];  Service:  Cardiopulmonary;  Laterality: Bilateral;   Family History Family History  Problem Relation Age of Onset   Atrial fibrillation Mother    Heart disease Mother        Patient unclear of what kind   Deep vein thrombosis Mother    Hyperlipidemia  Mother    Hypertension Mother    Stroke Father    Diabetes Father    Heart disease Father        Patient unclear of what kind   Kidney disease Father    Hyperlipidemia Father    Hypertension Father    Diabetes Sister    Heart disease Sister        Patient unclear of what kind   Brain cancer Sister    Seizures Sister    Deep vein thrombosis Other    Pulmonary embolism Other    Cancer Paternal Grandmother    Lung disease Neg Hx    Colon cancer Neg Hx    Esophageal cancer Neg Hx    Rectal cancer Neg Hx    Stomach cancer Neg Hx     Social History Social History   Tobacco Use   Smoking status: Every Day    Packs/day: 1.00    Years: 48.00    Additional pack years: 0.00    Total pack years: 48.00    Types: Cigarettes    Start date: 12/16/1965   Smokeless tobacco: Never  Vaping Use   Vaping Use: Never used  Substance Use Topics   Alcohol use: No    Alcohol/week: 0.0 standard drinks of alcohol   Drug use: No   Allergies Benazepril hcl, Pantoprazole sodium, and Amlodipine  Review of Systems Review of Systems  All other systems reviewed and are negative.   Physical Exam Vital Signs  I have reviewed the triage vital signs BP (!) 141/84 (BP Location: Right Arm)   Pulse (!) 50   Temp 98.9 F (37.2 C) (Oral)   Resp 15   Ht 5\' 9"  (1.753 m)   Wt 79.4 kg   SpO2 97%   BMI 25.85 kg/m  Physical Exam Vitals and nursing note reviewed.  Constitutional:      General: He is not in acute distress.    Appearance: Normal appearance.  HENT:     Head: Normocephalic and atraumatic.     Mouth/Throat:     Mouth: Mucous membranes are moist.  Eyes:     Conjunctiva/sclera: Conjunctivae normal.  Cardiovascular:     Rate and Rhythm:  Normal rate.  Pulmonary:     Effort: Pulmonary effort is normal. No respiratory distress.  Abdominal:     General: Abdomen is flat.  Musculoskeletal:     Comments: Left knee without evidence of effusion.  Some painful range of motion.  Patient able to stand up and bear weight and take some steps.  No significant tenderness over the knee.  Skin:    General: Skin is warm and dry.     Capillary Refill: Capillary refill takes less than 2 seconds.  Neurological:     General: No focal deficit present.     Mental Status: He is alert. Mental status is at baseline.  Psychiatric:        Mood and Affect: Mood normal.        Behavior: Behavior normal.     ED Results and Treatments Labs (all labs ordered are listed, but only abnormal results are displayed) Labs Reviewed - No data to display  Radiology DG Knee Complete 4 Views Left  Result Date: 07/22/2022 CLINICAL DATA:  Left knee pain EXAM: LEFT KNEE - COMPLETE 4 VIEW COMPARISON:  Left knee radiographs April 24, 2011 FINDINGS: No evidence of fracture, dislocation, or joint effusion. The joint spaces are well-maintained. Superior patellar enthesophyte. Scattered vascular calcifications in the soft tissues. IMPRESSION: No acute fracture or dislocation. Electronically Signed   By: Jacob Moores M.D.   On: 07/22/2022 19:18    Pertinent labs & imaging results that were available during my care of the patient were reviewed by me and considered in my medical decision making (see MDM for details).  Medications Ordered in ED Medications  oxyCODONE-acetaminophen (PERCOCET/ROXICET) 5-325 MG per tablet 2 tablet (2 tablets Oral Given 07/22/22 1936)                                                                                                                                     Procedures Procedures  (including critical care  time)  Medical Decision Making / ED Course   MDM:  72 year old male with knee pain after stepping down a step.  On exam, patient has no effusion, no focal tenderness.  He is weightbearing and able to take steps.  X-ray negative for fracture.  Suspect likely sprain, possible but less likely ligamentous injury.  Advised follow-up with orthopedic surgery, RICE therapy.  Advised continue home chronic opioid therapy can add Tylenol as needed. Will discharge patient to home. All questions answered. Patient comfortable with plan of discharge. Return precautions discussed with patient and specified on the after visit summary.        Imaging Studies ordered: I ordered imaging studies including XR knee On my interpretation imaging demonstrates no fracture I independently visualized and interpreted imaging. I agree with the radiologist interpretation   Medicines ordered and prescription drug management: Meds ordered this encounter  Medications   oxyCODONE-acetaminophen (PERCOCET/ROXICET) 5-325 MG per tablet 2 tablet    -I have reviewed the patients home medicines and have made adjustments as needed  Social Determinants of Health:  Diagnosis or treatment significantly limited by social determinants of health: current smoker   Reevaluation: After the interventions noted above, I reevaluated the patient and found that their symptoms have improved  Co morbidities that complicate the patient evaluation  Past Medical History:  Diagnosis Date   AAA (abdominal aortic aneurysm) (HCC)    a. s/p EVAR 05/2010.   Back pain    Barrett's esophagus    CKD (chronic kidney disease), stage II    Degenerative disk disease    a. Chronic back pain with ridiculopathy.   Diverticulosis    Emphysema of lung (HCC)    Essential hypertension    GERD (gastroesophageal reflux disease)    Hiatal hernia    Hyperlipidemia    Neck rigidity    from cervical fusion- patient cannot turn head   Pulmonary  nodule    a.  3mm nodule seen on CT 09/2014 - f/u recommended 1 yr.   Skin cancer    Tobacco abuse       Dispostion: Disposition decision including need for hospitalization was considered, and patient discharged from emergency department.    Final Clinical Impression(s) / ED Diagnoses Final diagnoses:  Acute pain of left knee     This chart was dictated using voice recognition software.  Despite best efforts to proofread,  errors can occur which can change the documentation meaning.    Lonell Grandchild, MD 07/22/22 [id number]

## 2022-07-22 NOTE — ED Notes (Signed)
Dc instructions reviewed with pt no questions or concerns at this tiime. Will follow up with ortho.

## 2022-07-22 NOTE — Discharge Instructions (Addendum)
We evaluated you for your knee pain.  Your x-ray did not show any sign of a broken bone.  Your symptoms are likely due to a knee sprain.  It is still possible you have an injury to your ligament.   Please keep your leg elevated, and apply ice.  Continue your chronic pain medication at home.  Please add 1000 mg of Tylenol every 6 hours for some additional pain control.  If you feel that it is helpful, you can also use a soft brace or an Ace wrap.  I would recommend following up with an orthopedic surgeon if your pain does not improve.  You can follow-up with Dr. Steward Drone.    Please return if you develop any new or worsening symptoms, trouble walking, fevers or chills, increasing swelling or any other concerning symptoms.

## 2022-07-30 ENCOUNTER — Ambulatory Visit (INDEPENDENT_AMBULATORY_CARE_PROVIDER_SITE_OTHER): Payer: 59 | Admitting: Orthopaedic Surgery

## 2022-07-30 ENCOUNTER — Other Ambulatory Visit (HOSPITAL_BASED_OUTPATIENT_CLINIC_OR_DEPARTMENT_OTHER): Payer: Self-pay

## 2022-07-30 DIAGNOSIS — S83412A Sprain of medial collateral ligament of left knee, initial encounter: Secondary | ICD-10-CM | POA: Diagnosis not present

## 2022-07-30 MED ORDER — MELOXICAM 15 MG PO TABS
15.0000 mg | ORAL_TABLET | Freq: Every day | ORAL | 0 refills | Status: DC
  Filled 2022-07-30: qty 14, 14d supply, fill #0

## 2022-07-30 NOTE — Progress Notes (Signed)
Chief Complaint: Left knee pain     History of Present Illness:    [name] is a 72 y.o. male with left knee pain.  He went to the emergency room after he had stopped off of a step and the knee gave out.  Since that time he is complaining of pain and swelling.  He is complaining of pain predominantly about the medial collateral ligament area with radiation to the lower leg.  This is slowly improving.    Surgical History:   None  PMH/PSH/Family History/Social History/Meds/Allergies:    Past Medical History:  Diagnosis Date   AAA (abdominal aortic aneurysm) (HCC)    a. s/p EVAR 05/2010.   Back pain    Barrett's esophagus    CKD (chronic kidney disease), stage II    Degenerative disk disease    a. Chronic back pain with ridiculopathy.   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 3mm nodule seen on CT 09/2014 - f/u recommended 1 yr.   Skin cancer    Tobacco abuse    Past Surgical History:  Procedure Laterality Date   ABDOMINAL AORTIC ENDOVASCULAR STENT GRAFT  2014   APPENDECTOMY     BACK SURGERY     BIOPSY N/A 06/10/2013   Procedure: BIOPSY;  Surgeon: Malissa Hippo, MD;  Location: [facility];  Service: Endoscopy;  Laterality: N/A;   BIOPSY  12/03/2014   Procedure: BIOPSY (Gastroesophageal Junction);  Surgeon: Malissa Hippo, MD;  Location: [facility];  Service: Endoscopy;;   ESOPHAGEAL DILATION N/A 12/03/2014   Procedure: ESOPHAGEAL DILATION WITH 54FR AND 56FR MALONEY;  Surgeon: Malissa Hippo, MD;  Location: [facility];  Service: Endoscopy;  Laterality: N/A;   ESOPHAGOGASTRODUODENOSCOPY (EGD) WITH PROPOFOL N/A 06/10/2013   Procedure: ESOPHAGOGASTRODUODENOSCOPY (EGD) WITH PROPOFOL;  Surgeon: Malissa Hippo, MD;  Location: [facility];  Service: Endoscopy;   Laterality: N/A;   ESOPHAGOGASTRODUODENOSCOPY (EGD) WITH PROPOFOL N/A 12/03/2014   Procedure: ESOPHAGOGASTRODUODENOSCOPY (EGD) WITH PROPOFOL;  Surgeon: Malissa Hippo, MD;  Location: [facility];  Service: Endoscopy;  Laterality: N/A;   EVAR  06/20/10   for AAA repair    LEFT HEART CATH AND CORONARY ANGIOGRAPHY N/A 02/04/2017   Procedure: LEFT HEART CATH AND CORONARY ANGIOGRAPHY;  Surgeon: Runell Gess, MD;  [facility];  Service: Cardiovascular;  Laterality: N/A;   MALONEY DILATION N/A 06/10/2013   Procedure: MALONEY DILATION 52 fr, 54 fr;  Surgeon: Malissa Hippo, MD;  Location: [facility];  Service: Endoscopy;  Laterality: N/A;   SPINAL FUSION     of neck and lower back   VIDEO BRONCHOSCOPY Bilateral 02/24/2018   Procedure: VIDEO BRONCHOSCOPY WITH FLUORO;  Surgeon: Chilton Greathouse, MD;  Location: [facility];  Service: Cardiopulmonary;  Laterality: Bilateral;   Social History   Socioeconomic History   Marital status: Married    Spouse name: Not on file   Number of children: Not on file   Years of education: Not on file   Highest education level: Not on file  Occupational History  Not on file  Tobacco Use   Smoking status: Every Day    Packs/day: 1.00    Years: 48.00    Additional pack years: 0.00    Total pack years: 48.00    Types: Cigarettes    Start date: 12/16/1965   Smokeless tobacco: Never  Vaping Use   Vaping Use: Never used  Substance and Sexual Activity   Alcohol use: No    Alcohol/week: 0.0 standard drinks of alcohol   Drug use: No   Sexual activity: Not on file  Other Topics Concern   Not on file  Social History Narrative   Originally from [place], [place]. Always lived in [place]. Travel to the [place] in March 2016. Has traveled to [place], [place], [place], & [place]. 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 No known asbestos exposure. Has 2 parakeets currently. Remotely raised  parakeets, love birds, & cockatiels in a different house. No known mold or hot tub exposure.    Social Determinants of Health   Financial Resource Strain: Not on file  Food Insecurity: Not on file  Transportation Needs: Not on file  Physical Activity: Not on file  Stress: Not on file  Social Connections: Not on file   Family History  Problem Relation Age of Onset   Atrial fibrillation Mother    Heart disease Mother        Patient unclear of what kind   Deep vein thrombosis Mother    Hyperlipidemia Mother    Hypertension Mother    Stroke Father    Diabetes Father    Heart disease Father        Patient unclear of what kind   Kidney disease Father    Hyperlipidemia Father    Hypertension Father    Diabetes Sister    Heart disease Sister        Patient unclear of what kind   Brain cancer Sister    Seizures Sister    Deep vein thrombosis Other    Pulmonary embolism Other    Cancer Paternal Grandmother    Lung disease Neg Hx    Colon cancer Neg Hx    Esophageal cancer Neg Hx    Rectal cancer Neg Hx    Stomach cancer Neg Hx    Allergies  Allergen Reactions   Benazepril Hcl Shortness Of Breath and Other (See Comments)    Can't breathe   Pantoprazole Sodium Shortness Of Breath and Other (See Comments)    Can't breathe   Amlodipine Swelling and Other (See Comments)    Location of swelling not recalled by the patient   Current Outpatient Medications  Medication Sig Dispense Refill   meloxicam (MOBIC) 15 MG tablet Take 1 tablet (15 mg total) by mouth daily. 14 tablet 0   albuterol (PROAIR HFA) 108 (90 Base) MCG/ACT inhaler Inhale 2 puffs into the lungs every 6 (six) hours as needed. 1 Inhaler 3   aspirin EC 81 MG tablet Take 81 mg by mouth daily.     diclofenac sodium (VOLTAREN) 1 % GEL 3 grams to 3 large joints up to 3 times daily (Patient not taking: Reported on 09/13/2020) 3 Tube 3   esomeprazole (NEXIUM) 40 MG capsule Take 1 capsule (40 mg total) by mouth daily at 12  noon. (Patient not taking: Reported on 01/08/2022) 30 capsule 3   fluticasone (FLONASE) 50 MCG/ACT nasal spray PLACE 2 SPRAYS INTO EACH NOSTRIL  EVERY DAY (Patient not taking: Reported on 01/08/2022) 48 mL 1   losartan-hydrochlorothiazide (HYZAAR) 100-12.5 MG tablet Take one-half tablet daily by mouth (Patient taking differently: Take 0.5 tablets by mouth daily as needed (SBP >130 or headache from high blood pressure).) 30 tablet 3   meclizine (ANTIVERT) 25 MG tablet Take 1 tablet (25 mg total) by mouth 3 (three) times daily as needed for dizziness. 30 tablet 0   Oxycodone HCl 10 MG TABS Take 10 mg by mouth 5 (five) times daily as needed.     Pediatric Multivit-Minerals-C (KIDS GUMMY BEAR VITAMINS) CHEW Chew 2 each by mouth daily.      predniSONE (DELTASONE) 10 MG tablet Take 4 tabs po daily x 3 days; then 3 tabs daily x3 days; then 2 tabs daily x3 days; then 1 tab daily x 3 days; then stop (Patient not taking: Reported on 09/13/2020) 30 tablet 0   Spacer/Aero-Holding Chambers (AEROCHAMBER MV) inhaler Use as instructed (Patient not taking: Reported on 01/08/2022) 1 each 0   SYMBICORT 160-4.5 MCG/ACT inhaler TAKE 2 PUFFS BY MOUTH TWICE A DAY 30.6 Inhaler 0   No current facility-administered medications for this visit.   No results found.  Review of Systems:   A ROS was performed including pertinent positives and negatives as documented in the HPI.  Physical Exam :   Constitutional: NAD and appears stated age Neurological: Alert and oriented Psych: Appropriate affect and cooperative There were no vitals taken for this visit.   Comprehensive Musculoskeletal Exam:    Tenderness palpation about the MCL.  No medial joint line tenderness.  No lateral joint line tenderness.  No laxity with valgus stress.  Range of motion is from 0 to 130 degrees  Imaging:   Xray (3 views right knee): There is a small osteophyte of the patella without evidence of any type of avulsion    I personally reviewed  and interpreted the radiographs.   Assessment:   72 y.o. male with left knee pain consistent with an MCL sprain.  At this time I have advised that he may begin taking a 2-week course of Mobic.  I will plan to see him back as needed.  I did describe that these do frequently heal uneventfully and he does not have any laxity on ligamentous exam today.  Plan :    -Return to clinic as needed     I personally saw and evaluated the patient, and participated in the management and treatment plan.  Huel Cote, MD Attending Physician, Orthopedic Surgery  This document was dictated using Dragon voice recognition software. A reasonable attempt at proof reading has been made to minimize errors.

## 2022-08-14 ENCOUNTER — Other Ambulatory Visit (HOSPITAL_COMMUNITY): Payer: 59

## 2022-08-14 ENCOUNTER — Ambulatory Visit: Payer: 59 | Admitting: Vascular Surgery

## 2022-08-27 ENCOUNTER — Ambulatory Visit (HOSPITAL_BASED_OUTPATIENT_CLINIC_OR_DEPARTMENT_OTHER): Payer: 59 | Admitting: Student

## 2022-09-10 DIAGNOSIS — M461 Sacroiliitis, not elsewhere classified: Secondary | ICD-10-CM | POA: Diagnosis not present

## 2022-09-10 DIAGNOSIS — M47818 Spondylosis without myelopathy or radiculopathy, sacral and sacrococcygeal region: Secondary | ICD-10-CM | POA: Diagnosis not present

## 2022-09-10 DIAGNOSIS — M25552 Pain in left hip: Secondary | ICD-10-CM | POA: Diagnosis not present

## 2022-09-10 DIAGNOSIS — M1612 Unilateral primary osteoarthritis, left hip: Secondary | ICD-10-CM | POA: Diagnosis not present

## 2022-11-05 DIAGNOSIS — M25552 Pain in left hip: Secondary | ICD-10-CM | POA: Diagnosis not present

## 2022-11-05 DIAGNOSIS — M1612 Unilateral primary osteoarthritis, left hip: Secondary | ICD-10-CM | POA: Diagnosis not present

## 2022-11-05 DIAGNOSIS — Z79891 Long term (current) use of opiate analgesic: Secondary | ICD-10-CM | POA: Diagnosis not present

## 2022-11-05 DIAGNOSIS — M47818 Spondylosis without myelopathy or radiculopathy, sacral and sacrococcygeal region: Secondary | ICD-10-CM | POA: Diagnosis not present

## 2022-11-05 DIAGNOSIS — G894 Chronic pain syndrome: Secondary | ICD-10-CM | POA: Diagnosis not present

## 2022-11-05 DIAGNOSIS — M461 Sacroiliitis, not elsewhere classified: Secondary | ICD-10-CM | POA: Diagnosis not present

## 2022-12-03 ENCOUNTER — Ambulatory Visit (HOSPITAL_COMMUNITY)
Admission: RE | Admit: 2022-12-03 | Discharge: 2022-12-03 | Disposition: A | Discharge status: Home / Self Care | Payer: 59 | Source: Ambulatory Visit | Attending: Vascular Surgery | Admitting: Vascular Surgery

## 2022-12-03 DIAGNOSIS — I7143 Infrarenal abdominal aortic aneurysm, without rupture: Secondary | ICD-10-CM | POA: Insufficient documentation

## 2022-12-04 ENCOUNTER — Ambulatory Visit (HOSPITAL_COMMUNITY): Payer: 59

## 2022-12-04 ENCOUNTER — Encounter: Payer: Self-pay | Admitting: Vascular Surgery

## 2022-12-04 ENCOUNTER — Ambulatory Visit (INDEPENDENT_AMBULATORY_CARE_PROVIDER_SITE_OTHER): Payer: 59 | Admitting: Vascular Surgery

## 2022-12-04 VITALS — BP 178/102 | HR 66 | Temp 98.3°F | Resp 18 | Ht 68.0 in | Wt 169.7 lb

## 2022-12-04 DIAGNOSIS — I723 Aneurysm of iliac artery: Secondary | ICD-10-CM

## 2022-12-04 DIAGNOSIS — I7143 Infrarenal abdominal aortic aneurysm, without rupture: Secondary | ICD-10-CM

## 2022-12-04 NOTE — Progress Notes (Signed)
Patient name: [name] MRN: [id number] DOB: Jan 28, 1950 Sex: male  REASON FOR CONSULT: 6 month follow-up EVAR surveillance  HPI: [name] is a 72 y.o. male, with history of EVAR in 2012 by Dr. Imogene Burn for an abdominal aortic aneurysm that presents for 6 month follow-up.  He was previously seen in the [facility] in December 2023 with an enlarging aneurysm from 3.3 to 4.5 cm on duplex in the abdominal aorta with no endoleak.  He was then sent for CTA and scheduled to follow-up with me. I reviewed with the patient that his stent graft was in good position with no endoleak within the abdominal aneurysm.  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  The distal limb of the stent graft has no seal here but there is really no significant endoleak into the aneurysm sac up top as there is seal in the more proximal limb.    On 35-month follow-up today he has no new complaints.  No new abdominal or back pain.  Past Medical History:  Diagnosis Date   AAA (abdominal aortic aneurysm) (HCC)    a. s/p EVAR 05/2010.   Back pain    Barrett's esophagus    CKD (chronic kidney disease), stage II    Degenerative disk disease    a. Chronic back pain with ridiculopathy.   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 3mm nodule seen on CT 09/2014 - f/u recommended 1 yr.   Skin cancer    Tobacco abuse     Past Surgical History:  Procedure Laterality Date   ABDOMINAL AORTIC ENDOVASCULAR STENT GRAFT  2014   APPENDECTOMY     BACK SURGERY     BIOPSY N/A 06/10/2013   Procedure: BIOPSY;  Surgeon: Malissa Hippo, MD;  Location: [facility];  Service: Endoscopy;   Laterality: N/A;   BIOPSY  12/03/2014   Procedure: BIOPSY (Gastroesophageal Junction);  Surgeon: Malissa Hippo, MD;  [facility]: [facility];  Service: Endoscopy;;   ESOPHAGEAL DILATION N/A 12/03/2014   Procedure: ESOPHAGEAL DILATION WITH 54FR AND 56FR MALONEY;  Surgeon: Malissa Hippo, MD;  Location: [facility];  Service: Endoscopy;  Laterality: N/A;   ESOPHAGOGASTRODUODENOSCOPY (EGD) WITH PROPOFOL N/A 06/10/2013   Procedure: ESOPHAGOGASTRODUODENOSCOPY (EGD) WITH PROPOFOL;  Surgeon: Malissa Hippo, MD;  [facility];  Service: Endoscopy;  Laterality: N/A;   ESOPHAGOGASTRODUODENOSCOPY (EGD) WITH PROPOFOL N/A 12/03/2014   Procedure: ESOPHAGOGASTRODUODENOSCOPY (EGD) WITH PROPOFOL;  Surgeon: Malissa Hippo, MD;  Location: [facility];  Service: Endoscopy;  Laterality: N/A;   EVAR  06/20/10   for AAA repair    LEFT HEART CATH AND CORONARY ANGIOGRAPHY N/A 02/04/2017   Procedure: LEFT HEART CATH AND CORONARY ANGIOGRAPHY;  Surgeon: Runell Gess, MD;  [facility];  Service: Cardiovascular;  Laterality: N/A;   MALONEY DILATION N/A 06/10/2013   Procedure: MALONEY DILATION 52 fr, 54 fr;  Surgeon: Malissa Hippo, MD;  Location: [facility];  Service: Endoscopy;  Laterality: N/A;   SPINAL FUSION     of neck and  lower back   VIDEO BRONCHOSCOPY Bilateral 02/24/2018   Procedure: VIDEO BRONCHOSCOPY WITH FLUORO;  Surgeon: Chilton Greathouse, MD;  [facility];  Service: Cardiopulmonary;  Laterality: Bilateral;    Family History  Problem Relation Age of Onset   Atrial fibrillation Mother    Heart disease Mother        Patient unclear of what kind   Deep vein thrombosis Mother    Hyperlipidemia Mother    Hypertension Mother    Stroke Father    Diabetes Father    Heart disease Father        Patient unclear of what kind   Kidney disease Father    Hyperlipidemia Father    Hypertension Father    Diabetes Sister    Heart disease Sister        Patient unclear of what kind   Brain cancer  Sister    Seizures Sister    Deep vein thrombosis Other    Pulmonary embolism Other    Cancer Paternal Grandmother    Lung disease Neg Hx    Colon cancer Neg Hx    Esophageal cancer Neg Hx    Rectal cancer Neg Hx    Stomach cancer Neg Hx     SOCIAL HISTORY: Social History   Socioeconomic History   Marital status: Married    Spouse name: Not on file   Number of children: Not on file   Years of education: Not on file   Highest education level: Not on file  Occupational History   Not on file  Tobacco Use   Smoking status: Every Day    Current packs/day: 1.00    Average packs/day: 1 pack/day for 57.0 years (57.0 ttl pk-yrs)    Types: Cigarettes    Start date: 12/16/1965   Smokeless tobacco: Never  Vaping Use   Vaping status: Never Used  Substance and Sexual Activity   Alcohol use: No    Alcohol/week: 0.0 standard drinks of alcohol   Drug use: No   Sexual activity: Not on file  Other Topics Concern   Not on file  Social History Narrative   Originally from [place], [place]. Always lived in [place]. Travel to the [place] in March 2016. Has traveled to [place], [place], [place], & [place]. 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 No known asbestos exposure. Has 2 parakeets currently. Remotely raised parakeets, love birds, & cockatiels in a different house. No known mold or hot tub exposure.    Social Determinants of Health   Financial Resource Strain: Not on file  Food Insecurity: Not on file  Transportation Needs: Not on file  Physical Activity: Not on file  Stress: Not on file  Social Connections: Unknown (06/04/2021)   Received from [facility], [facility]   Social Network    Social Network: Not on file  Intimate Partner Violence: Unknown (04/26/2021)   [facility], [facility]   HITS    Physically Hurt: Not on file    Insult or Talk Down To: Not on file    Threaten Physical Harm:  Not on file    Scream or Curse: Not on file    Allergies  Allergen Reactions   Benazepril Hcl Shortness Of Breath and Other (See Comments)    Can't breathe   Pantoprazole Sodium Shortness Of Breath and Other (See Comments)    Can't breathe   Amlodipine Swelling and  Other (See Comments)    Location of swelling not recalled by the patient    Current Outpatient Medications  Medication Sig Dispense Refill   albuterol (PROAIR HFA) 108 (90 Base) MCG/ACT inhaler Inhale 2 puffs into the lungs every 6 (six) hours as needed. 1 Inhaler 3   aspirin EC 81 MG tablet Take 81 mg by mouth daily.     diclofenac sodium (VOLTAREN) 1 % GEL 3 grams to 3 large joints up to 3 times daily (Patient not taking: Reported on 09/13/2020) 3 Tube 3   esomeprazole (NEXIUM) 40 MG capsule Take 1 capsule (40 mg total) by mouth daily at 12 noon. (Patient not taking: Reported on 01/08/2022) 30 capsule 3   fluticasone (FLONASE) 50 MCG/ACT nasal spray PLACE 2 SPRAYS INTO EACH NOSTRIL EVERY DAY (Patient not taking: Reported on 01/08/2022) 48 mL 1   losartan-hydrochlorothiazide (HYZAAR) 100-12.5 MG tablet Take one-half tablet daily by mouth (Patient taking differently: Take 0.5 tablets by mouth daily as needed (SBP >130 or headache from high blood pressure).) 30 tablet 3   meclizine (ANTIVERT) 25 MG tablet Take 1 tablet (25 mg total) by mouth 3 (three) times daily as needed for dizziness. 30 tablet 0   meloxicam (MOBIC) 15 MG tablet Take 1 tablet (15 mg total) by mouth daily. 14 tablet 0   Oxycodone HCl 10 MG TABS Take 10 mg by mouth 5 (five) times daily as needed.     Pediatric Multivit-Minerals-C (KIDS GUMMY BEAR VITAMINS) CHEW Chew 2 each by mouth daily.      predniSONE (DELTASONE) 10 MG tablet Take 4 tabs po daily x 3 days; then 3 tabs daily x3 days; then 2 tabs daily x3 days; then 1 tab daily x 3 days; then stop (Patient not taking: Reported on 09/13/2020) 30 tablet 0   Spacer/Aero-Holding Chambers (AEROCHAMBER MV) inhaler Use  as instructed (Patient not taking: Reported on 01/08/2022) 1 each 0   SYMBICORT 160-4.5 MCG/ACT inhaler TAKE 2 PUFFS BY MOUTH TWICE A DAY 30.6 Inhaler 0   No current facility-administered medications for this visit.    REVIEW OF SYSTEMS:  [X]  denotes positive finding, [ ]  denotes negative finding Cardiac  Comments:  Chest pain or chest pressure:    Shortness of breath upon exertion:    Short of breath when lying flat:    Irregular heart rhythm:        Vascular    Pain in calf, thigh, or hip brought on by ambulation:    Pain in feet at night that wakes you up from your sleep:     Blood clot in your veins:    Leg swelling:         Pulmonary    Oxygen at home:    Productive cough:     Wheezing:         Neurologic    Sudden weakness in arms or legs:     Sudden numbness in arms or legs:     Sudden onset of difficulty speaking or slurred speech:    Temporary loss of vision in one eye:     Problems with dizziness:         Gastrointestinal    Blood in stool:     Vomited blood:         Genitourinary    Burning when urinating:     Blood in urine:        Psychiatric    Major depression:         Hematologic  Bleeding problems:    Problems with blood clotting too easily:        Skin    Rashes or ulcers:        Constitutional    Fever or chills:      PHYSICAL EXAM: There were no vitals filed for this visit.   GENERAL: The patient is a well-nourished male, in no acute distress. The vital signs are documented above. CARDIAC: There is a regular rate and rhythm.  VASCULAR:  Bilateral femoral pulses palpable Bilateral DP pulses palpable PULMONARY: No respiratory distress. ABDOMEN: Soft and non-tender. MUSCULOSKELETAL: There are no major deformities or cyanosis. NEUROLOGIC: No focal weakness or paresthesias are detected. PSYCHIATRIC: The patient has a normal affect.  DATA:   EVAR duplex yesterday shows AAA increased to 4.1 cm from 3 cm over the past 6 months with  endoleak on the right limb  Assessment/Plan:  72 year old male presents for 6 month follow-up of his stent graft after repair in 2012 by Dr. Imogene Burn.  EVAR duplex yesterday showed increased size of his AAA to 4.1 cm from 3 cm over the past 6 months.  Concern for endoleak on the right limb that would be consistent with a type 1b endoleak.  Will send for CTA for further evaluation.  Discussed I will have him follow-up with me after CTA is complete to further discuss if he needs intervention.   Cephus Shelling, MD [facility] Office: [phone]

## 2022-12-12 ENCOUNTER — Other Ambulatory Visit: Payer: Self-pay

## 2022-12-12 DIAGNOSIS — I7143 Infrarenal abdominal aortic aneurysm, without rupture: Secondary | ICD-10-CM

## 2022-12-31 DIAGNOSIS — M1612 Unilateral primary osteoarthritis, left hip: Secondary | ICD-10-CM | POA: Diagnosis not present

## 2022-12-31 DIAGNOSIS — M47818 Spondylosis without myelopathy or radiculopathy, sacral and sacrococcygeal region: Secondary | ICD-10-CM | POA: Diagnosis not present

## 2022-12-31 DIAGNOSIS — M25552 Pain in left hip: Secondary | ICD-10-CM | POA: Diagnosis not present

## 2022-12-31 DIAGNOSIS — M461 Sacroiliitis, not elsewhere classified: Secondary | ICD-10-CM | POA: Diagnosis not present

## 2023-01-02 ENCOUNTER — Other Ambulatory Visit: Payer: 59

## 2023-01-02 DIAGNOSIS — J029 Acute pharyngitis, unspecified: Secondary | ICD-10-CM | POA: Diagnosis not present

## 2023-01-02 DIAGNOSIS — Z0001 Encounter for general adult medical examination with abnormal findings: Secondary | ICD-10-CM | POA: Diagnosis not present

## 2023-01-02 DIAGNOSIS — J439 Emphysema, unspecified: Secondary | ICD-10-CM | POA: Diagnosis not present

## 2023-01-07 ENCOUNTER — Ambulatory Visit
Admission: RE | Admit: 2023-01-07 | Discharge: 2023-01-07 | Disposition: A | Discharge status: Home / Self Care | Payer: 59 | Source: Ambulatory Visit | Attending: Vascular Surgery | Admitting: Vascular Surgery

## 2023-01-07 DIAGNOSIS — Z9889 Other specified postprocedural states: Secondary | ICD-10-CM | POA: Diagnosis not present

## 2023-01-07 DIAGNOSIS — I7143 Infrarenal abdominal aortic aneurysm, without rupture: Secondary | ICD-10-CM

## 2023-01-07 MED ORDER — IOPAMIDOL (ISOVUE-370) INJECTION 76%
100.0000 mL | Freq: Once | INTRAVENOUS | Status: AC | PRN
  Administered 2023-01-07: 100 mL via INTRAVENOUS

## 2023-01-08 ENCOUNTER — Encounter: Payer: Self-pay | Admitting: Vascular Surgery

## 2023-01-08 ENCOUNTER — Ambulatory Visit (INDEPENDENT_AMBULATORY_CARE_PROVIDER_SITE_OTHER): Payer: 59 | Admitting: Vascular Surgery

## 2023-01-08 VITALS — BP 153/90 | HR 72 | Temp 98.2°F | Resp 20 | Ht 68.0 in | Wt 171.7 lb

## 2023-01-08 DIAGNOSIS — I7143 Infrarenal abdominal aortic aneurysm, without rupture: Secondary | ICD-10-CM

## 2023-01-08 DIAGNOSIS — I723 Aneurysm of iliac artery: Secondary | ICD-10-CM | POA: Diagnosis not present

## 2023-01-08 NOTE — Progress Notes (Signed)
Patient name: [name] MRN: [id number] DOB: 03-01-1950 Sex: male  REASON FOR CONSULT: Follow-up after CTA with concern for enlarging aneurysm after remote EVAR  HPI: [name] is a 72 y.o. male, with history of EVAR in 2012 by Dr. Imogene Burn for an abdominal aortic aneurysm that presents for follow-up after CTA given concern for enlarging aneurysm after remote EVAR.    He was previously seen in the [facility] clinic in December 2023 with an enlarging aneurysm from 3.3 to 4.5 cm on duplex in the abdominal aorta with no endoleak.  He was then sent for CTA and scheduled to follow-up with me. I reviewed with the patient that his stent graft was in good position with no endoleak within the abdominal aneurysm.  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  The distal limb of the stent graft has no seal here but there is really no significant endoleak into the aneurysm sac up top as there is seal in the more proximal limb.   On 66-month follow-up on 12/03/2022 his aneurysm sac again showed increase to 4.1 cm with concern for a type Ib endoleak on the right limb.  I sent him for CTA again.  Past Medical History:  Diagnosis Date   AAA (abdominal aortic aneurysm) (HCC)    a. s/p EVAR 05/2010.   Back pain    Barrett's esophagus    CKD (chronic kidney disease), stage II    Degenerative disk disease    a. Chronic back pain with ridiculopathy.   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 3mm nodule seen on CT 09/2014 - f/u recommended 1 yr.   Skin cancer    Tobacco abuse     Past Surgical History:  Procedure Laterality Date   ABDOMINAL AORTIC ENDOVASCULAR  STENT GRAFT  2014   APPENDECTOMY     BACK SURGERY     BIOPSY N/A 06/10/2013   Procedure: BIOPSY;  Surgeon: Malissa Hippo, MD;  Location: [facility];  Service: Endoscopy;  Laterality: N/A;   BIOPSY  12/03/2014   Procedure: BIOPSY (Gastroesophageal Junction);  Surgeon: Malissa Hippo, MD;  Location: [facility];  Service: Endoscopy;;   ESOPHAGEAL DILATION N/A 12/03/2014   Procedure: ESOPHAGEAL DILATION WITH 54FR AND 56FR MALONEY;  Surgeon: Malissa Hippo, MD;  Location: [facility];  Service: Endoscopy;  Laterality: N/A;   ESOPHAGOGASTRODUODENOSCOPY (EGD) WITH PROPOFOL N/A 06/10/2013   Procedure: ESOPHAGOGASTRODUODENOSCOPY (EGD) WITH PROPOFOL;  Surgeon: Malissa Hippo, MD;  Location: [facility];  Service: Endoscopy;  Laterality: N/A;   ESOPHAGOGASTRODUODENOSCOPY (EGD) WITH PROPOFOL N/A 12/03/2014   Procedure: ESOPHAGOGASTRODUODENOSCOPY (EGD) WITH PROPOFOL;  Surgeon: Malissa Hippo, MD;  Location: [facility];  Service: Endoscopy;  Laterality: N/A;   EVAR  06/20/10   for AAA repair    LEFT HEART CATH AND CORONARY ANGIOGRAPHY N/A 02/04/2017   Procedure: LEFT HEART CATH AND CORONARY ANGIOGRAPHY;  Surgeon: Runell Gess, MD;  [facility];  Service: Cardiovascular;  Laterality: N/A;   MALONEY DILATION N/A 06/10/2013   Procedure: MALONEY DILATION  52 fr, 54 fr;  Surgeon: Malissa Hippo, MD;  Location: [facility];  Service: Endoscopy;  Laterality: N/A;   SPINAL FUSION     of neck and lower back   VIDEO BRONCHOSCOPY Bilateral 02/24/2018   Procedure: VIDEO BRONCHOSCOPY WITH FLUORO;  Surgeon: Chilton Greathouse, MD;  [facility]: [facility];  Service: Cardiopulmonary;  Laterality: Bilateral;    Family History  Problem Relation Age of Onset   Atrial fibrillation Mother    Heart disease Mother        Patient unclear of what kind   Deep vein thrombosis Mother    Hyperlipidemia Mother    Hypertension Mother    Stroke Father    Diabetes Father    Heart disease Father        Patient unclear of what kind    Kidney disease Father    Hyperlipidemia Father    Hypertension Father    Diabetes Sister    Heart disease Sister        Patient unclear of what kind   Brain cancer Sister    Seizures Sister    Deep vein thrombosis Other    Pulmonary embolism Other    Cancer Paternal Grandmother    Lung disease Neg Hx    Colon cancer Neg Hx    Esophageal cancer Neg Hx    Rectal cancer Neg Hx    Stomach cancer Neg Hx     SOCIAL HISTORY: Social History   Socioeconomic History   Marital status: Married    Spouse name: Not on file   Number of children: Not on file   Years of education: Not on file   Highest education level: Not on file  Occupational History   Not on file  Tobacco Use   Smoking status: Every Day    Current packs/day: 1.00    Average packs/day: 1 pack/day for 57.1 years (57.1 ttl pk-yrs)    Types: Cigarettes    Start date: 12/16/1965   Smokeless tobacco: Never  Vaping Use   Vaping status: Never Used  Substance and Sexual Activity   Alcohol use: No    Alcohol/week: 0.0 standard drinks of alcohol   Drug use: No   Sexual activity: Not on file  Other Topics Concern   Not on file  Social History Narrative   Originally from [place], [place]. Always lived in [place]. Travel to the [place] in March 2016. Has traveled to [place], [place], [place], & [place]. 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 No known asbestos exposure. Has 2 parakeets currently. Remotely raised parakeets, love birds, & cockatiels in a different house. No known mold or hot tub exposure.    Social Drivers of Corporate investment banker Strain: Not on file  Food Insecurity: Not on file  Transportation Needs: Not on file  Physical Activity: Not on file  Stress: Not on file  Social Connections: Unknown (06/04/2021)   Received from [facility], [facility]   Social Network    Social Network: Not on file  Intimate Partner Violence: Unknown  (04/26/2021)   [facility], [facility]   HITS    Physically Hurt: Not on file    Insult or Talk Down To: Not on file    Threaten Physical Harm: Not on file    Scream or Curse: Not on file    Allergies  Allergen Reactions   Benazepril Hcl Shortness Of  Breath and Other (See Comments)    Can't breathe   Pantoprazole Sodium Shortness Of Breath and Other (See Comments)    Can't breathe   Amlodipine Swelling and Other (See Comments)    Location of swelling not recalled by the patient    Current Outpatient Medications  Medication Sig Dispense Refill   albuterol (PROAIR HFA) 108 (90 Base) MCG/ACT inhaler Inhale 2 puffs into the lungs every 6 (six) hours as needed. (Patient not taking: Reported on 12/04/2022) 1 Inhaler 3   aspirin EC 81 MG tablet Take 81 mg by mouth daily.     diclofenac sodium (VOLTAREN) 1 % GEL 3 grams to 3 large joints up to 3 times daily (Patient not taking: Reported on 09/13/2020) 3 Tube 3   esomeprazole (NEXIUM) 40 MG capsule Take 1 capsule (40 mg total) by mouth daily at 12 noon. (Patient not taking: Reported on 01/08/2022) 30 capsule 3   fluticasone (FLONASE) 50 MCG/ACT nasal spray PLACE 2 SPRAYS INTO EACH NOSTRIL EVERY DAY (Patient not taking: Reported on 01/08/2022) 48 mL 1   losartan-hydrochlorothiazide (HYZAAR) 100-12.5 MG tablet Take one-half tablet daily by mouth (Patient not taking: Reported on 12/04/2022) 30 tablet 3   meclizine (ANTIVERT) 25 MG tablet Take 1 tablet (25 mg total) by mouth 3 (three) times daily as needed for dizziness. (Patient not taking: Reported on 12/04/2022) 30 tablet 0   meloxicam (MOBIC) 15 MG tablet Take 1 tablet (15 mg total) by mouth daily. (Patient not taking: Reported on 12/04/2022) 14 tablet 0   Oxycodone HCl 10 MG TABS Take 10 mg by mouth 5 (five) times daily as needed.     Pediatric Multivit-Minerals-C (KIDS GUMMY BEAR VITAMINS) CHEW Chew 2 each by mouth daily.  (Patient not taking: Reported on 12/04/2022)      predniSONE (DELTASONE) 10 MG tablet Take 4 tabs po daily x 3 days; then 3 tabs daily x3 days; then 2 tabs daily x3 days; then 1 tab daily x 3 days; then stop (Patient not taking: Reported on 09/13/2020) 30 tablet 0   Spacer/Aero-Holding Chambers (AEROCHAMBER MV) inhaler Use as instructed (Patient not taking: Reported on 01/08/2022) 1 each 0   SYMBICORT 160-4.5 MCG/ACT inhaler TAKE 2 PUFFS BY MOUTH TWICE A DAY 30.6 Inhaler 0   No current facility-administered medications for this visit.    REVIEW OF SYSTEMS:  [X]  denotes positive finding, [ ]  denotes negative finding Cardiac  Comments:  Chest pain or chest pressure:    Shortness of breath upon exertion:    Short of breath when lying flat:    Irregular heart rhythm:        Vascular    Pain in calf, thigh, or hip brought on by ambulation:    Pain in feet at night that wakes you up from your sleep:     Blood clot in your veins:    Leg swelling:         Pulmonary    Oxygen at home:    Productive cough:     Wheezing:         Neurologic    Sudden weakness in arms or legs:     Sudden numbness in arms or legs:     Sudden onset of difficulty speaking or slurred speech:    Temporary loss of vision in one eye:     Problems with dizziness:         Gastrointestinal    Blood in stool:     Vomited blood:  Genitourinary    Burning when urinating:     Blood in urine:        Psychiatric    Major depression:         Hematologic    Bleeding problems:    Problems with blood clotting too easily:        Skin    Rashes or ulcers:        Constitutional    Fever or chills:      PHYSICAL EXAM: There were no vitals filed for this visit.   GENERAL: The patient is a well-nourished male, in no acute distress. The vital signs are documented above. CARDIAC: There is a regular rate and rhythm.  VASCULAR:  Bilateral femoral pulses palpable Bilateral DP pulses palpable PULMONARY: No respiratory distress. ABDOMEN: Soft and  non-tender. MUSCULOSKELETAL: There are no major deformities or cyanosis.   DATA:   CTA reviewed from yesterday with endograft in good position.  There is complete shrinkage of the aneurysm sac with no leakage of contrast into the abdominal aneurysm.  There is again a right common iliac aneurysm with no seal of the distal limb although the contrast does not make it into the aneurysm sac.  The right common iliac aneurysm measures 22 mm and is unchanged  EVAR duplex previously showed AAA increased to 4.1 cm from 3 cm over the past 6 months with endoleak on the right limb  Assessment/Plan:  72 year old male presents for follow-up after CTA for further evaluation of enlarging aneurysm sac with concern for type Ib endoleak.  His AAA was previously repaired in 2012 by Dr. Imogene Burn.  Previous visit his AAA had increased from 3 to 4.1 cm with concern for a type Ib endoleak.  I sent him for CTA which I have reviewed.  Discussed that his aortic aneurysm has actually not increased and there is seal on the right side with no flow into the aneurysm sac.  The AAA has appropriately shrunk around the endograft.  Distally there is a right common iliac artery aneurysm that is unchanged at 2.2 cm in maximal diameter.  Discussed for common iliac aneurysms we recommend surgical intervention for greater than 3 to 3.5 cm.  There is no indication for intervention at this time.  I will see him in 6 months with EVAR duplex.   Cephus Shelling, MD [facility] Office: [phone]

## 2023-01-10 ENCOUNTER — Other Ambulatory Visit: Payer: Self-pay

## 2023-01-10 DIAGNOSIS — Z9889 Other specified postprocedural states: Secondary | ICD-10-CM

## 2023-03-06 DIAGNOSIS — M47818 Spondylosis without myelopathy or radiculopathy, sacral and sacrococcygeal region: Secondary | ICD-10-CM | POA: Diagnosis not present

## 2023-03-06 DIAGNOSIS — M1612 Unilateral primary osteoarthritis, left hip: Secondary | ICD-10-CM | POA: Diagnosis not present

## 2023-03-06 DIAGNOSIS — M25552 Pain in left hip: Secondary | ICD-10-CM | POA: Diagnosis not present

## 2023-03-06 DIAGNOSIS — M461 Sacroiliitis, not elsewhere classified: Secondary | ICD-10-CM | POA: Diagnosis not present

## 2023-04-05 DIAGNOSIS — J439 Emphysema, unspecified: Secondary | ICD-10-CM | POA: Diagnosis not present

## 2023-04-05 DIAGNOSIS — R6889 Other general symptoms and signs: Secondary | ICD-10-CM | POA: Diagnosis not present

## 2023-04-05 DIAGNOSIS — J069 Acute upper respiratory infection, unspecified: Secondary | ICD-10-CM | POA: Diagnosis not present

## 2023-04-05 DIAGNOSIS — J849 Interstitial pulmonary disease, unspecified: Secondary | ICD-10-CM | POA: Diagnosis not present

## 2023-04-06 ENCOUNTER — Ambulatory Visit (HOSPITAL_COMMUNITY)
Admission: RE | Admit: 2023-04-06 | Discharge: 2023-04-06 | Disposition: A | Discharge status: Home / Self Care | Source: Ambulatory Visit | Attending: Family Medicine | Admitting: Family Medicine

## 2023-04-06 ENCOUNTER — Other Ambulatory Visit (HOSPITAL_COMMUNITY): Payer: Self-pay | Admitting: Family Medicine

## 2023-04-06 DIAGNOSIS — J069 Acute upper respiratory infection, unspecified: Secondary | ICD-10-CM | POA: Insufficient documentation

## 2023-04-06 DIAGNOSIS — R918 Other nonspecific abnormal finding of lung field: Secondary | ICD-10-CM | POA: Diagnosis not present

## 2023-04-06 DIAGNOSIS — I700 Atherosclerosis of aorta: Secondary | ICD-10-CM | POA: Diagnosis not present

## 2023-04-06 DIAGNOSIS — R059 Cough, unspecified: Secondary | ICD-10-CM | POA: Diagnosis not present

## 2023-05-02 DIAGNOSIS — M47818 Spondylosis without myelopathy or radiculopathy, sacral and sacrococcygeal region: Secondary | ICD-10-CM | POA: Diagnosis not present

## 2023-05-02 DIAGNOSIS — M461 Sacroiliitis, not elsewhere classified: Secondary | ICD-10-CM | POA: Diagnosis not present

## 2023-05-02 DIAGNOSIS — G894 Chronic pain syndrome: Secondary | ICD-10-CM | POA: Diagnosis not present

## 2023-05-02 DIAGNOSIS — M25552 Pain in left hip: Secondary | ICD-10-CM | POA: Diagnosis not present

## 2023-05-02 DIAGNOSIS — M1612 Unilateral primary osteoarthritis, left hip: Secondary | ICD-10-CM | POA: Diagnosis not present

## 2023-05-02 DIAGNOSIS — Z79891 Long term (current) use of opiate analgesic: Secondary | ICD-10-CM | POA: Diagnosis not present

## 2023-05-20 DIAGNOSIS — K219 Gastro-esophageal reflux disease without esophagitis: Secondary | ICD-10-CM | POA: Diagnosis not present

## 2023-05-20 DIAGNOSIS — R1310 Dysphagia, unspecified: Secondary | ICD-10-CM | POA: Diagnosis not present

## 2023-05-20 DIAGNOSIS — Z860101 Personal history of adenomatous and serrated colon polyps: Secondary | ICD-10-CM | POA: Diagnosis not present

## 2023-06-04 DIAGNOSIS — K5730 Diverticulosis of large intestine without perforation or abscess without bleeding: Secondary | ICD-10-CM | POA: Diagnosis not present

## 2023-06-04 DIAGNOSIS — K2289 Other specified disease of esophagus: Secondary | ICD-10-CM | POA: Diagnosis not present

## 2023-06-04 DIAGNOSIS — Z09 Encounter for follow-up examination after completed treatment for conditions other than malignant neoplasm: Secondary | ICD-10-CM | POA: Diagnosis not present

## 2023-06-04 DIAGNOSIS — R1310 Dysphagia, unspecified: Secondary | ICD-10-CM | POA: Diagnosis not present

## 2023-06-04 DIAGNOSIS — K219 Gastro-esophageal reflux disease without esophagitis: Secondary | ICD-10-CM | POA: Diagnosis not present

## 2023-06-04 DIAGNOSIS — K2970 Gastritis, unspecified, without bleeding: Secondary | ICD-10-CM | POA: Diagnosis not present

## 2023-06-04 DIAGNOSIS — Z860100 Personal history of colon polyps, unspecified: Secondary | ICD-10-CM | POA: Diagnosis not present

## 2023-06-04 DIAGNOSIS — K635 Polyp of colon: Secondary | ICD-10-CM | POA: Diagnosis not present

## 2023-06-04 DIAGNOSIS — K319 Disease of stomach and duodenum, unspecified: Secondary | ICD-10-CM | POA: Diagnosis not present

## 2023-06-04 LAB — HM COLONOSCOPY

## 2023-06-06 DIAGNOSIS — K635 Polyp of colon: Secondary | ICD-10-CM | POA: Diagnosis not present

## 2023-06-06 DIAGNOSIS — K219 Gastro-esophageal reflux disease without esophagitis: Secondary | ICD-10-CM | POA: Diagnosis not present

## 2023-06-06 DIAGNOSIS — K319 Disease of stomach and duodenum, unspecified: Secondary | ICD-10-CM | POA: Diagnosis not present

## 2023-06-27 DIAGNOSIS — M461 Sacroiliitis, not elsewhere classified: Secondary | ICD-10-CM | POA: Diagnosis not present

## 2023-06-27 DIAGNOSIS — M961 Postlaminectomy syndrome, not elsewhere classified: Secondary | ICD-10-CM | POA: Diagnosis not present

## 2023-06-27 DIAGNOSIS — M47818 Spondylosis without myelopathy or radiculopathy, sacral and sacrococcygeal region: Secondary | ICD-10-CM | POA: Diagnosis not present

## 2023-06-27 DIAGNOSIS — M25552 Pain in left hip: Secondary | ICD-10-CM | POA: Diagnosis not present

## 2023-07-08 NOTE — Progress Notes (Signed)
 Patient name: [name] MRN: [id number] DOB: 08-Mar-1950 Sex: male  REASON FOR CONSULT: 6 month follow-up EVAR   HPI: [name] is a 73 y.o. male, with history of EVAR in 2012 by Dr. Farrel Hones for an abdominal aortic aneurysm that presents for 6 month follow-up and ongoing surveillance.  Last seen on 01/08/2023 with CTA showing stent graft in good position with shrinkage of aneurysm sac.  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  Past Medical History:  Diagnosis Date   AAA (abdominal aortic aneurysm) (HCC)    a. s/p EVAR 05/2010.   Back pain    Barrett's esophagus    CKD (chronic kidney disease), stage II    Degenerative disk disease    a. Chronic back pain with ridiculopathy.   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 3mm nodule seen on CT 09/2014 - f/u recommended 1 yr.   Skin cancer    Tobacco abuse     Past Surgical History:  Procedure Laterality Date   ABDOMINAL AORTIC ENDOVASCULAR STENT GRAFT  2014   APPENDECTOMY     BACK SURGERY     BIOPSY N/A 06/10/2013   Procedure: BIOPSY;  Surgeon: Ruby Corporal, MD;  Location: [facility];  Service: Endoscopy;  Laterality: N/A;   BIOPSY  12/03/2014   Procedure: BIOPSY (Gastroesophageal Junction);  Surgeon: Ruby Corporal, MD;  Location: [facility];  Service: Endoscopy;;   ESOPHAGEAL DILATION N/A 12/03/2014   Procedure: ESOPHAGEAL DILATION WITH 54FR AND 56FR MALONEY;  Surgeon: Ruby Corporal, MD;  Location: [facility];  Service: Endoscopy;  Laterality: N/A;   ESOPHAGOGASTRODUODENOSCOPY (EGD) WITH PROPOFOL  N/A 06/10/2013   Procedure: ESOPHAGOGASTRODUODENOSCOPY (EGD) WITH PROPOFOL ;  Surgeon: Ruby Corporal, MD;  Location: [facility];  Service: Endoscopy;  Laterality: N/A;   ESOPHAGOGASTRODUODENOSCOPY (EGD) WITH PROPOFOL  N/A  12/03/2014   Procedure: ESOPHAGOGASTRODUODENOSCOPY (EGD) WITH PROPOFOL ;  Surgeon: Ruby Corporal, MD;  Location: [facility];  Service: Endoscopy;  Laterality: N/A;   EVAR  06/20/10   for AAA repair    LEFT HEART CATH AND CORONARY ANGIOGRAPHY N/A 02/04/2017   Procedure: LEFT HEART CATH AND CORONARY ANGIOGRAPHY;  Surgeon: Avanell Leigh, MD;  [facility];  Service: Cardiovascular;  Laterality: N/A;   MALONEY DILATION N/A 06/10/2013   Procedure: MALONEY DILATION 52 fr, 54 fr;  Surgeon: Ruby Corporal, MD;  Location: [facility];  Service: Endoscopy;  Laterality: N/A;   SPINAL FUSION     of neck and lower back   VIDEO BRONCHOSCOPY Bilateral 02/24/2018   Procedure: VIDEO BRONCHOSCOPY WITH FLUORO;  Surgeon: [name], Praveen, MD;  Location: [facility];  Service: Cardiopulmonary;  Laterality: Bilateral;    Family History  Problem Relation Age of Onset   Atrial fibrillation Mother    Heart disease Mother        Patient unclear of what kind   Deep vein thrombosis Mother    Hyperlipidemia Mother    Hypertension Mother    Stroke Father    Diabetes Father    Heart disease Father        Patient unclear of what kind   Kidney disease Father    Hyperlipidemia Father    Hypertension  Father    Diabetes Sister    Heart disease Sister        Patient unclear of what kind   Brain cancer Sister    Seizures Sister    Deep vein thrombosis Other    Pulmonary embolism Other    Cancer Paternal Grandmother    Lung disease Neg Hx    Colon cancer Neg Hx    Esophageal cancer Neg Hx    Rectal cancer Neg Hx    Stomach cancer Neg Hx     SOCIAL HISTORY: Social History   Socioeconomic History   Marital status: Married    Spouse name: Not on file   Number of children: Not on file   Years of education: Not on file   Highest education level: Not on file  Occupational History   Not on file  Tobacco Use   Smoking status: Every Day    Current packs/day: 1.00    Average packs/day: 1 pack/day  for 57.6 years (57.6 ttl pk-yrs)    Types: Cigarettes    Start date: 12/16/1965   Smokeless tobacco: Never  Vaping Use   Vaping status: Never Used  Substance and Sexual Activity   Alcohol use: No    Alcohol/week: 0.0 standard drinks of alcohol   Drug use: No   Sexual activity: Not on file  Other Topics Concern   Not on file  Social History Narrative   Originally from [place], [place]. Always lived in [place]. Travel to the [place] in March 2016. Has traveled to [place], [place], [place], & [place]. 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 No known asbestos exposure. Has 2 parakeets currently. Remotely raised parakeets, love birds, & cockatiels in a different house. No known mold or hot tub exposure.    Social Drivers of Corporate investment banker Strain: Not on file  Food Insecurity: Not on file  Transportation Needs: Not on file  Physical Activity: Not on file  Stress: Not on file  Social Connections: Unknown (06/04/2021)   [facility]   Social Network    Social Network: Not on file  Intimate Partner Violence: Unknown (04/26/2021)   Received from [facility]   HITS    Physically Hurt: Not on file    Insult or Talk Down To: Not on file    Threaten Physical Harm: Not on file    Scream or Curse: Not on file    Allergies  Allergen Reactions   Benazepril Hcl Shortness Of Breath and Other (See Comments)    Can't breathe   Pantoprazole Sodium Shortness Of Breath and Other (See Comments)    Can't breathe   Amlodipine  Swelling and Other (See Comments)    Location of swelling not recalled by the patient    Current Outpatient Medications  Medication Sig Dispense Refill   albuterol  (PROAIR  HFA) 108 (90 Base) MCG/ACT inhaler Inhale 2 puffs into the lungs every 6 (six) hours as needed. (Patient not taking: Reported on 01/08/2023) 1 Inhaler 3   aspirin  EC 81 MG tablet Take 81 mg by mouth daily.      diclofenac  sodium (VOLTAREN ) 1 % GEL 3 grams to 3 large joints up to 3 times daily (Patient not taking: Reported on 01/08/2023) 3 Tube 3   esomeprazole  (NEXIUM ) 40 MG capsule Take 1 capsule (40 mg total) by mouth daily at 12 noon. (Patient not taking: Reported on 01/08/2023) 30 capsule  3   fluticasone  (FLONASE ) 50 MCG/ACT nasal spray PLACE 2 SPRAYS INTO EACH NOSTRIL EVERY DAY (Patient not taking: Reported on 01/08/2022) 48 mL 1   losartan -hydrochlorothiazide  (HYZAAR) 100-12.5 MG tablet Take one-half tablet daily by mouth (Patient not taking: Reported on 01/08/2023) 30 tablet 3   meclizine  (ANTIVERT ) 25 MG tablet Take 1 tablet (25 mg total) by mouth 3 (three) times daily as needed for dizziness. (Patient not taking: Reported on 12/04/2022) 30 tablet 0   meloxicam  (MOBIC ) 15 MG tablet Take 1 tablet (15 mg total) by mouth daily. (Patient not taking: Reported on 01/08/2023) 14 tablet 0   Oxycodone  HCl 10 MG TABS Take 10 mg by mouth 5 (five) times daily as needed.     Pediatric Multivit-Minerals-C (KIDS GUMMY BEAR VITAMINS) CHEW Chew 2 each by mouth daily.  (Patient not taking: Reported on 12/04/2022)     predniSONE  (DELTASONE ) 10 MG tablet Take 4 tabs po daily x 3 days; then 3 tabs daily x3 days; then 2 tabs daily x3 days; then 1 tab daily x 3 days; then stop (Patient not taking: Reported on 01/08/2023) 30 tablet 0   Spacer/Aero-Holding Chambers (AEROCHAMBER MV) inhaler Use as instructed (Patient not taking: Reported on 01/08/2022) 1 each 0   SYMBICORT  160-4.5 MCG/ACT inhaler TAKE 2 PUFFS BY MOUTH TWICE A DAY 30.6 Inhaler 0   No current facility-administered medications for this visit.    REVIEW OF SYSTEMS:  [X]  denotes positive finding, [ ]  denotes negative finding Cardiac  Comments:  Chest pain or chest pressure:    Shortness of breath upon exertion:    Short of breath when lying flat:    Irregular heart rhythm:        Vascular    Pain in calf, thigh, or hip brought on by ambulation:    Pain  in feet at night that wakes you up from your sleep:     Blood clot in your veins:    Leg swelling:         Pulmonary    Oxygen at home:    Productive cough:     Wheezing:         Neurologic    Sudden weakness in arms or legs:     Sudden numbness in arms or legs:     Sudden onset of difficulty speaking or slurred speech:    Temporary loss of vision in one eye:     Problems with dizziness:         Gastrointestinal    Blood in stool:     Vomited blood:         Genitourinary    Burning when urinating:     Blood in urine:        Psychiatric    Major depression:         Hematologic    Bleeding problems:    Problems with blood clotting too easily:        Skin    Rashes or ulcers:        Constitutional    Fever or chills:      PHYSICAL EXAM: There were no vitals filed for this visit.   GENERAL: The patient is a well-nourished male, in no acute distress. The vital signs are documented above. CARDIAC: There is a regular rate and rhythm.  VASCULAR:  Bilateral femoral pulses palpable Bilateral DP pulses palpable PULMONARY: No respiratory distress. ABDOMEN: Soft and non-tender. MUSCULOSKELETAL: There are no major deformities or cyanosis.   DATA:  EVAR duplex today   CTA reviewed 01/07/23 with endograft in good position.  There is complete shrinkage of the aneurysm sac with no leakage of contrast into the abdominal aneurysm.  There is again a right common iliac aneurysm with no seal of the distal limb although the contrast does not make it into the aneurysm sac.  The right common iliac aneurysm measures 22 mm and is unchanged    Assessment/Plan:   73 y.o. male, with history of EVAR in 2012 by Dr. Farrel Hones for an abdominal aortic aneurysm that presents for 6 month follow-up and ongoing surveillance.   Young Hensen, MD [facility] Office: [phone]

## 2023-07-09 ENCOUNTER — Ambulatory Visit (HOSPITAL_COMMUNITY)
Admission: RE | Admit: 2023-07-09 | Discharge: 2023-07-09 | Disposition: A | Discharge status: Home / Self Care | Payer: 59 | Source: Ambulatory Visit | Attending: Vascular Surgery | Admitting: Vascular Surgery

## 2023-07-09 ENCOUNTER — Ambulatory Visit: Payer: 59 | Attending: Vascular Surgery | Admitting: Vascular Surgery

## 2023-07-09 VITALS — BP 127/79 | HR 93 | Temp 97.9°F | Resp 18 | Ht 68.0 in | Wt 166.0 lb

## 2023-07-09 DIAGNOSIS — I7143 Infrarenal abdominal aortic aneurysm, without rupture: Secondary | ICD-10-CM | POA: Diagnosis not present

## 2023-07-09 DIAGNOSIS — Z9889 Other specified postprocedural states: Secondary | ICD-10-CM | POA: Diagnosis not present

## 2023-07-11 ENCOUNTER — Other Ambulatory Visit: Payer: Self-pay | Admitting: *Deleted

## 2023-07-11 DIAGNOSIS — Z9889 Other specified postprocedural states: Secondary | ICD-10-CM

## 2023-09-12 DIAGNOSIS — M47818 Spondylosis without myelopathy or radiculopathy, sacral and sacrococcygeal region: Secondary | ICD-10-CM | POA: Diagnosis not present

## 2023-09-12 DIAGNOSIS — M25552 Pain in left hip: Secondary | ICD-10-CM | POA: Diagnosis not present

## 2023-09-12 DIAGNOSIS — M961 Postlaminectomy syndrome, not elsewhere classified: Secondary | ICD-10-CM | POA: Diagnosis not present

## 2023-09-12 DIAGNOSIS — M461 Sacroiliitis, not elsewhere classified: Secondary | ICD-10-CM | POA: Diagnosis not present

## 2023-10-31 ENCOUNTER — Encounter: Payer: Self-pay | Admitting: Family Medicine

## 2023-10-31 ENCOUNTER — Ambulatory Visit (INDEPENDENT_AMBULATORY_CARE_PROVIDER_SITE_OTHER): Admitting: Family Medicine

## 2023-10-31 VITALS — BP 128/70 | HR 71 | Temp 98.4°F | Ht 68.0 in | Wt 170.0 lb

## 2023-10-31 DIAGNOSIS — Z7689 Persons encountering health services in other specified circumstances: Secondary | ICD-10-CM | POA: Insufficient documentation

## 2023-10-31 DIAGNOSIS — I10 Essential (primary) hypertension: Secondary | ICD-10-CM

## 2023-10-31 DIAGNOSIS — J3089 Other allergic rhinitis: Secondary | ICD-10-CM | POA: Diagnosis not present

## 2023-10-31 DIAGNOSIS — N182 Chronic kidney disease, stage 2 (mild): Secondary | ICD-10-CM

## 2023-10-31 DIAGNOSIS — K219 Gastro-esophageal reflux disease without esophagitis: Secondary | ICD-10-CM

## 2023-10-31 DIAGNOSIS — R911 Solitary pulmonary nodule: Secondary | ICD-10-CM | POA: Diagnosis not present

## 2023-10-31 DIAGNOSIS — J849 Interstitial pulmonary disease, unspecified: Secondary | ICD-10-CM | POA: Diagnosis not present

## 2023-10-31 DIAGNOSIS — I723 Aneurysm of iliac artery: Secondary | ICD-10-CM

## 2023-10-31 DIAGNOSIS — I714 Abdominal aortic aneurysm, without rupture, unspecified: Secondary | ICD-10-CM

## 2023-10-31 DIAGNOSIS — Z131 Encounter for screening for diabetes mellitus: Secondary | ICD-10-CM | POA: Diagnosis not present

## 2023-10-31 DIAGNOSIS — J439 Emphysema, unspecified: Secondary | ICD-10-CM

## 2023-10-31 DIAGNOSIS — L989 Disorder of the skin and subcutaneous tissue, unspecified: Secondary | ICD-10-CM

## 2023-10-31 DIAGNOSIS — F17210 Nicotine dependence, cigarettes, uncomplicated: Secondary | ICD-10-CM

## 2023-10-31 MED ORDER — LOSARTAN POTASSIUM 50 MG PO TABS: 50.0000 mg | ORAL_TABLET | Freq: Every day | ORAL | 1 refills | Status: AC

## 2023-10-31 MED ORDER — LORATADINE 10 MG PO TABS: 10.0000 mg | ORAL_TABLET | Freq: Every day | ORAL | 11 refills | Status: AC

## 2023-10-31 MED ORDER — ALBUTEROL SULFATE HFA 108 (90 BASE) MCG/ACT IN AERS: 2.0000 | INHALATION_SPRAY | Freq: Four times a day (QID) | RESPIRATORY_TRACT | 3 refills | Status: AC | PRN

## 2023-10-31 NOTE — Assessment & Plan Note (Addendum)
 Has not seen Pulm since 2022. Continues to smoke. Referral for lung cancer screening. Encouraged smoking cessation.

## 2023-10-31 NOTE — Assessment & Plan Note (Addendum)
 Referral to dermatology, reported history of cancerous lesions

## 2023-10-31 NOTE — Assessment & Plan Note (Signed)
 Today we reviewed medical history, health maintenance items, and current concerns. Labs today with 2-4 week follow up for HTN.

## 2023-10-31 NOTE — Assessment & Plan Note (Signed)
 3-5 minute discussion regarding the harms of tobacco use, the benefits of cessation, and methods of cessation. Discussed that there are medication options to help with cessation. Provided printed education on steps to quit smoking. Patient is not ready to try a medication to help.

## 2023-10-31 NOTE — Patient Instructions (Signed)
 It was great to meet you today and I'm excited to have you join the [facility] practice. I hope you had a positive experience today! If you feel so inclined, please feel free to recommend our practice to friends and family. Jeoffrey Barrio, FNP-C

## 2023-10-31 NOTE — Assessment & Plan Note (Addendum)
 Non compliant with medications due to episodes of hypotension. Taking Hyzaar 100-25 1/2 tablet every other day or less. Will change therapy to Losartan  50mg  daily. Encouraged daily BP at home and follow up in my office in 2-4 weeks. Recommend heart healthy diet such as Mediterranean diet with whole grains, fruits, vegetable, fish, lean meats, nuts, and olive oil. Limit salt. Encouraged exercise as tolerated. Aim for at least 150 minutes.week. Goal should be pace of 3 miles/hours, or walking 1.5 miles in 30 minutes. Avoid tobacco products. Avoid excess alcohol. Take medications as prescribed and bring medications and blood pressure log with cuff to each office visit. Seek medical care for chest pain, palpitations, shortness of breath with exertion, dizziness/lightheadedness, vision changes, recurrent headaches, or swelling of extremities.

## 2023-10-31 NOTE — Assessment & Plan Note (Signed)
 Symptoms including rhinorrhea and post-nasal drip. Start Claritin daily. Avoid triggers. Has been counseled in the past on animals in and around the home.

## 2023-10-31 NOTE — Assessment & Plan Note (Signed)
 Uncontrolled. Encouraged compliance with Symbicort  daily and PRN Albuterol . Referral back to pulm for his extensive pulmonary needs.

## 2023-10-31 NOTE — Assessment & Plan Note (Signed)
 Followed by vascular surgery. Meeasuring 2.6cm on 07/09/2023

## 2023-10-31 NOTE — Assessment & Plan Note (Signed)
 Labs today. Avoiding NSAIDs and staying hydrated. On Losartan 

## 2023-10-31 NOTE — Assessment & Plan Note (Signed)
 Uncontrolled. History of barretts. Followed by GI who recommended nexium  daily. Encouraged compliance. Elevated HOB if needed and avoid lying down 2-3 hours after eating, avoid coffee, alcohol, chocolate, fatty foods, citrus, carbonated beverages, spicy foods, late meals, and smoking. Return to office if symptoms return or worsen and seek medical care for difficulty swallowing, bleeding, anemia, weight loss, or recurrent vomiting.

## 2023-10-31 NOTE — Assessment & Plan Note (Addendum)
 Followed by vascular surgery q76mo surveillance, BP well controlled. Encouraged smoking cessation.   History of EVAR 2012 with Dr Laurence

## 2023-10-31 NOTE — Progress Notes (Signed)
 New Patient Office Visit  Subjective    Patient ID: [name], male    DOB: March 26, 1950  Age: 73 y.o. MRN: [id number]  CC:  Chief Complaint  Patient presents with   Establish Care    Needs pulmonology and dermatology     HPI [name] presents to establish care. Oriented to practice routines and expectations. Has been seeing PCP regularly. PMH includes HTN, infrarenal AAA, ILD, emphysema, pulmonary nodules, nicotine  dependence, Barrett's esophagus, and chronic pain.  Discussed the use of AI scribe software for clinical note transcription with the patient, who gave verbal consent to proceed.  History of Present Illness [name] is a 73 year old male who presents for a comprehensive review of his medical conditions and medication management.  He has a history of an aneurysm that has been repaired and is currently being monitored for another aneurysm to determine if it requires a stent. He has one stent already placed and is scheduled for a follow-up in December.  He experiences chronic back pain and degenerative disc disease, with two fused discs in the lower back and five in the neck, resulting in limited head movement. He takes oxycodone  10mg  5 times daily for pain management, prescribed by pain management specialist Dr. Orlando. Diagnoses include hip osteoarthritis, sacroiliitis, sacral spondylosis  He has a history of high blood pressure and high cholesterol. He takes aspirin , a cholesterol medication, and a blood pressure medication, which he takes as needed due to episodes of hypotension. He monitors his blood pressure every two to three days, noting it can rise to 155/95 mmHg and drop to 70/unknown mmHg. He reports feeling tired and weak when blood pressure is low.  He reports being told he has COPD or IPF and is unsure of the diagnosis, and reports increased coughing and phlegm production. He uses albuterol  as needed, approximately four to five times a day. He has a  history of lung nodules and was previously advised to undergo a more invasive lung biopsy, which he declined.  He has a history of acid reflux and was previously on Nexium , but discontinued it due to stomach discomfort with the generic version. He occasionally uses over-the-counter remedies for reflux symptoms. He reports occasional reflux symptoms and uses over-the-counter remedies as needed.  He has a history of skin cancer with lesions that were previously treated with cryotherapy. He is seeking dermatological care for new lesions on his arms and face.  He has a history of smoking approximately three-quarters of a pack per day for 55 years, starting at age 89. He expresses ambivalence about quitting, citing it as his only pleasure in life.  He reports a persistent runny nose and suspects allergies, which worsen in the winter. He has not been taking any allergy  medications recently.  He mentions a history of rheumatoid arthritis affecting his hands and knees, but is unclear about current management or medications. Has seen a Rheumatologist  He recalls having a colonoscopy last year, although records indicate the last one was 06/11/2023 and no repeat needed due to age. He is uncertain about the details of his gastrointestinal care.  He has a history of kidney disease, although details are not extensively discussed.   HPI Vascular Surgery 07/09/2023 for reference only: [name] is a 73 y.o. male, with history of EVAR in 2012 by Dr. Laurence for an abdominal aortic aneurysm that presents for 6 month follow-up and ongoing surveillance.  Last seen on 01/08/2023 with CTA showing  stent graft in good position with shrinkage of aneurysm sac.  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  Assessment/Plan: 73 y.o. male, with history of EVAR in 2012 by Dr. Laurence for an abdominal aortic aneurysm that presents for 6 month follow-up and ongoing surveillance.  Previous CT imaging last year showed  complete resolution of the aneurysm sac around the endograft.  He does have a right common iliac aneurysm that we are watching that last measured 2.2 cm.  This measures about 2.6 cm today but still does not meet criteria to repair.  Generally these are repaired around 3 to 3.5 cm as we discussed today.  Will continue to follow with EVAR duplex in 6 months.  Discussed smoking cessation with aspirin  for cardiovascular risk reduction.  HPI Pulmonology 02/11/2020 for reference only:  73 year old with history of emphysema, hemoptysis, GERD, active smoker hypertension, coronary artery disease, AAA.  He was previously followed by Dr. Noreen.  History noted for pulmonary emphysema with no obstruction on PFTs.  History of hemoptysis several years ago which has resolved, no parenchymal abnormalities on chest imaging.  He also has history of severe GERD status post esophageal dilatation in 2016 and 2018.  He continues on antiacid medication.   He was hospitalized in January 2019 for atypical chest pain.  Underwent left heart cath on 02/04/17 which was negative for any abnormality of his coronary arteries.    We advised him to get rid of the [facility].  He has not done so since he is attached to the pet however he is moved it to a different room and is trying to limit his exposure to it. Evaluated by Dr. 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  He is currently not on any immunosuppressive therapy   Underwent bronchoscope with BAL in Feb 2020 showing pneumococcus and Moraxella.  He is completed a course of Augmentin  for this Multidisciplinary ILD conference in March 2020 with recommendation for surgical lung biopsy. We have gone back and forth over lung biopsy and he has decided to defer it for now. Assessment:  Evaluation for interstitial lung disease CT scan reviewed lower lobe reticulation, scarring with no clear evidence of honeycombing This could be from  hypersensitivity pneumonitis although the pattern of lower lobe predominance is unusual. Other possibilities include chronic aspiration. He does have a parakeet at home which he is reluctant to get rid of, however he is avoiding significant exposure by moving into a different room. Connective tissue disease serology reviewed with positive ANA.    Differential diagnosis at this point includes chronic hypersensitivity pneumonitis, fibrotic NSIP from connective tissue disease, IPF. Since the treatment is different for each of these we will need a definitve diagnosis.   Bronchoscope with Laird classifier is negative for UIP fibrosis.   Discussion at multidisciplinary conference in March 2020 with recommendation for surgical lung biopsy.  Patient has been reluctant to do the procedure.   Repeat CT and PFTs for reevaluation   Emphysema PFTs do not show any obstruction.  Continues on Symbicort  which he is using intermittently Continues to smoke.  Encouraged smoking cessation.  Subcentimeter pulmonary nodule Has remained stable on follow-up imaging from 2016 to 2017.  This likely benign.   Follow-up on repeat imaging  Acid reflux, Barrett's esophagus Status post dilation by GI.  Continues on Nexium .    Chronic sinusitis, postnasal drip OTC chlorphentermine and Flonase  nasal spray.  Health maintenance Declined vaccination, not vaccinated against  COVID  Plan/Recommendations: - Repeat high-res CT, PFTs - Continue Symbicort , smoking cessation  Outpatient Encounter Medications as of 10/31/2023  Medication Sig   aspirin  EC 81 MG tablet Take 81 mg by mouth daily.   loratadine (CLARITIN) 10 MG tablet Take 1 tablet (10 mg total) by mouth daily.   losartan  (COZAAR ) 50 MG tablet Take 1 tablet (50 mg total) by mouth daily.   Oxycodone  HCl 10 MG TABS Take 10 mg by mouth 5 (five) times daily as needed.   [DISCONTINUED] esomeprazole  (NEXIUM ) 40 MG capsule Take 1 capsule (40 mg total) by mouth daily at 12  noon.   [DISCONTINUED] SYMBICORT  160-4.5 MCG/ACT inhaler TAKE 2 PUFFS BY MOUTH TWICE A DAY   albuterol  (PROAIR  HFA) 108 (90 Base) MCG/ACT inhaler Inhale 2 puffs into the lungs every 6 (six) hours as needed.   [DISCONTINUED] albuterol  (PROAIR  HFA) 108 (90 Base) MCG/ACT inhaler Inhale 2 puffs into the lungs every 6 (six) hours as needed. (Patient not taking: Reported on 01/08/2023)   [DISCONTINUED] diclofenac  sodium (VOLTAREN ) 1 % GEL 3 grams to 3 large joints up to 3 times daily (Patient not taking: Reported on 01/08/2023)   [DISCONTINUED] fluticasone  (FLONASE ) 50 MCG/ACT nasal spray PLACE 2 SPRAYS INTO EACH NOSTRIL EVERY DAY (Patient not taking: Reported on 01/08/2022)   [DISCONTINUED] losartan -hydrochlorothiazide  (HYZAAR) 100-12.5 MG tablet Take one-half tablet daily by mouth   [DISCONTINUED] meclizine  (ANTIVERT ) 25 MG tablet Take 1 tablet (25 mg total) by mouth 3 (three) times daily as needed for dizziness. (Patient not taking: Reported on 12/04/2022)   [DISCONTINUED] meloxicam  (MOBIC ) 15 MG tablet Take 1 tablet (15 mg total) by mouth daily. (Patient not taking: Reported on 01/08/2023)   [DISCONTINUED] Pediatric Multivit-Minerals-C (KIDS GUMMY BEAR VITAMINS) CHEW Chew 2 each by mouth daily.  (Patient not taking: Reported on 12/04/2022)   [DISCONTINUED] predniSONE  (DELTASONE ) 10 MG tablet Take 4 tabs po daily x 3 days; then 3 tabs daily x3 days; then 2 tabs daily x3 days; then 1 tab daily x 3 days; then stop (Patient not taking: Reported on 01/08/2023)   [DISCONTINUED] Spacer/Aero-Holding Raguel (AEROCHAMBER MV) inhaler Use as instructed (Patient not taking: Reported on 01/08/2022)   No facility-administered encounter medications on file as of 10/31/2023.    Past Medical History:  Diagnosis Date   AAA (abdominal aortic aneurysm)    a. s/p EVAR 05/2010.   Back pain    Barrett's esophagus    CKD (chronic kidney disease), stage II    Degenerative disk disease    a. Chronic back pain with  ridiculopathy.   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 3mm nodule seen on CT 09/2014 - f/u recommended 1 yr.   Skin cancer    Tobacco abuse     Past Surgical History:  Procedure Laterality Date   ABDOMINAL AORTIC ENDOVASCULAR STENT GRAFT  2014   APPENDECTOMY     BACK SURGERY     BIOPSY N/A 06/10/2013   Procedure: BIOPSY;  Surgeon: Claudis RAYMOND Rivet, MD;  Location: [facility];  Service: Endoscopy;  Laterality: N/A;   BIOPSY  12/03/2014   Procedure: BIOPSY (Gastroesophageal Junction);  Surgeon: Claudis RAYMOND Rivet, MD;  Location: [facility];  Service: Endoscopy;;  ESOPHAGEAL DILATION N/A 12/03/2014   Procedure: ESOPHAGEAL DILATION WITH 54FR AND 56FR MALONEY;  Surgeon: Claudis RAYMOND Rivet, MD;  Location: [facility];  Service: Endoscopy;  Laterality: N/A;   ESOPHAGOGASTRODUODENOSCOPY (EGD) WITH PROPOFOL  N/A 06/10/2013   Procedure: ESOPHAGOGASTRODUODENOSCOPY (EGD) WITH PROPOFOL ;  Surgeon: Claudis RAYMOND Rivet, MD;  Location: [facility];  Service: Endoscopy;  Laterality: N/A;   ESOPHAGOGASTRODUODENOSCOPY (EGD) WITH PROPOFOL  N/A 12/03/2014   Procedure: ESOPHAGOGASTRODUODENOSCOPY (EGD) WITH PROPOFOL ;  Surgeon: Claudis RAYMOND Rivet, MD;  Location: [facility];  Service: Endoscopy;  Laterality: N/A;   EVAR  06/20/10   for AAA repair    LEFT HEART CATH AND CORONARY ANGIOGRAPHY N/A 02/04/2017   Procedure: LEFT HEART CATH AND CORONARY ANGIOGRAPHY;  Surgeon: Court Dorn PARAS, MD;  Location: [facility];  Service: Cardiovascular;  Laterality: N/A;   MALONEY DILATION N/A 06/10/2013   Procedure: MALONEY DILATION 52 fr, 54 fr;  Surgeon: Claudis RAYMOND Rivet, MD;  Location: [facility];  Service: Endoscopy;  Laterality: N/A;   SPINAL FUSION     of neck and lower back   VIDEO BRONCHOSCOPY Bilateral 02/24/2018   Procedure: VIDEO  BRONCHOSCOPY WITH FLUORO;  Surgeon: [name], Praveen, MD;  Location: [facility];  Service: Cardiopulmonary;  Laterality: Bilateral;    Family History  Problem Relation Age of Onset   Atrial fibrillation Mother    Heart disease Mother        Patient unclear of what kind   Deep vein thrombosis Mother    Hyperlipidemia Mother    Hypertension Mother    Stroke Father    Diabetes Father    Heart disease Father        Patient unclear of what kind   Kidney disease Father    Hyperlipidemia Father    Hypertension Father    Diabetes Sister    Heart disease Sister        Patient unclear of what kind   Brain cancer Sister    Seizures Sister    Cancer Paternal Grandmother    Deep vein thrombosis Other    Pulmonary embolism Other    Lung disease Neg Hx    Colon cancer Neg Hx    Esophageal cancer Neg Hx    Rectal cancer Neg Hx    Stomach cancer Neg Hx     Social History   Socioeconomic History   Marital status: Married    Spouse name: Not on file   Number of children: Not on file   Years of education: Not on file   Highest education level: 8th grade  Occupational History   Not on file  Tobacco Use   Smoking status: Every Day    Current packs/day: 1.00    Average packs/day: 1 pack/day for 57.9 years (57.9 ttl pk-yrs)    Types: Cigarettes    Start date: 12/16/1965   Smokeless tobacco: Never  Vaping Use   Vaping status: Never Used  Substance and Sexual Activity   Alcohol use: No    Alcohol/week: 0.0 standard drinks of alcohol   Drug use: No   Sexual activity: Not on file  Other Topics Concern   Not on file  Social History Narrative   Originally from [place], [place]. Always lived in [place]. Travel to the [place] in March 2016. Has traveled to [place], [place], [place], & [place]. 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 No known asbestos exposure. Has 2 parakeets currently. Remotely  raised parakeets, love  birds, & cockatiels in a different house. No known mold or hot tub exposure.    Social Drivers of Corporate investment banker Strain: Low Risk  (10/30/2023)   Overall Financial Resource Strain (CARDIA)    Difficulty of Paying Living Expenses: Not very hard  Food Insecurity: No Food Insecurity (10/30/2023)   Hunger Vital Sign    Worried About Running Out of Food in the Last Year: Never true    Ran Out of Food in the Last Year: Never true  Transportation Needs: No Transportation Needs (10/30/2023)   PRAPARE - Administrator, Civil Service (Medical): No    Lack of Transportation (Non-Medical): No  Physical Activity: Sufficiently Active (10/30/2023)   Exercise Vital Sign    Days of Exercise per Week: 6 days    Minutes of Exercise per Session: 50 min  Stress: No Stress Concern Present (10/30/2023)   [employer] of Occupational Health - Occupational Stress Questionnaire    Feeling of Stress: Only a little  Social Connections: Moderately Integrated (10/30/2023)   Social Connection and Isolation Panel    Frequency of Communication with Friends and Family: Once a week    Frequency of Social Gatherings with Friends and Family: More than three times a week    Attends Religious Services: More than 4 times per year    Active Member of [employer] or Organizations: No    Attends Banker Meetings: Not on file    Marital Status: Married  Intimate Partner Violence: Unknown (04/26/2021)   Received from [facility]   HITS    Physically Hurt: Not on file    Insult or Talk Down To: Not on file    Threaten Physical Harm: Not on file    Scream or Curse: Not on file    Review of Systems  All other systems reviewed and are negative.       Objective    BP 128/70   Pulse 71   Temp 98.4 F (36.9 C)   Ht 5' 8 (1.727 m)   Wt 170 lb 0.3 oz (77.1 kg)   SpO2 95%   BMI 25.85 kg/m   Physical Exam Vitals and nursing note reviewed.  Constitutional:      Appearance: Normal  appearance. He is normal weight.  HENT:     Head: Normocephalic and atraumatic.  Cardiovascular:     Rate and Rhythm: Normal rate and regular rhythm.     Pulses: Normal pulses.     Heart sounds: Normal heart sounds.  Pulmonary:     Effort: Pulmonary effort is normal.     Breath sounds: Normal breath sounds.  Skin:    General: Skin is warm and dry.     Capillary Refill: Capillary refill takes less than 2 seconds.     Findings: Erythema and lesion present.      Neurological:     General: No focal deficit present.     Mental Status: He is alert and oriented to person, place, and time. Mental status is at baseline.  Psychiatric:        Mood and Affect: Mood normal.        Behavior: Behavior normal.        Thought Content: Thought content normal.        Judgment: Judgment normal.         Assessment & Plan:   Problem List Items Addressed This Visit     AAA (abdominal aortic aneurysm)  Followed by vascular surgery q43mo surveillance, BP well controlled. Encouraged smoking cessation.   History of EVAR 2012 with Dr Laurence      Relevant Medications   losartan  (COZAAR ) 50 MG tablet   HTN (hypertension)   Non compliant with medications due to episodes of hypotension. Taking Hyzaar 100-25 1/2 tablet every other day or less. Will change therapy to Losartan  50mg  daily. Encouraged daily BP at home and follow up in my office in 2-4 weeks. Recommend heart healthy diet such as Mediterranean diet with whole grains, fruits, vegetable, fish, lean meats, nuts, and olive oil. Limit salt. Encouraged exercise as tolerated. Aim for at least 150 minutes.week. Goal should be pace of 3 miles/hours, or walking 1.5 miles in 30 minutes. Avoid tobacco products. Avoid excess alcohol. Take medications as prescribed and bring medications and blood pressure log with cuff to each office visit. Seek medical care for chest pain, palpitations, shortness of breath with exertion, dizziness/lightheadedness, vision  changes, recurrent headaches, or swelling of extremities.       Relevant Medications   losartan  (COZAAR ) 50 MG tablet   Other Relevant Orders   CBC with Differential/Platelet   Comprehensive metabolic panel with GFR   Lipid panel   GERD (gastroesophageal reflux disease)   Uncontrolled. History of barretts. Followed by GI who recommended nexium  daily. Encouraged compliance. Elevated HOB if needed and avoid lying down 2-3 hours after eating, avoid coffee, alcohol, chocolate, fatty foods, citrus, carbonated beverages, spicy foods, late meals, and smoking. Return to office if symptoms return or worsen and seek medical care for difficulty swallowing, bleeding, anemia, weight loss, or recurrent vomiting.        Pulmonary nodule   Has not seen Pulm since 2022. Continues to smoke. Referral for lung cancer screening. Encouraged smoking cessation.       Relevant Orders   Ambulatory referral to Pulmonology   Ambulatory Referral for Lung Cancer Scre   CKD (chronic kidney disease), stage II   Labs today. Avoiding NSAIDs and staying hydrated. On Losartan       Relevant Orders   CBC with Differential/Platelet   Comprehensive metabolic panel with GFR   Lipid panel   Emphysema of lung (HCC)   Uncontrolled. Encouraged compliance with Symbicort  daily and PRN Albuterol . Referral back to pulm for his extensive pulmonary needs.       Relevant Medications   albuterol  (PROAIR  HFA) 108 (90 Base) MCG/ACT inhaler   loratadine (CLARITIN) 10 MG tablet   Other Relevant Orders   Ambulatory referral to Pulmonology   Ambulatory Referral for Lung Cancer Scre   ILD (interstitial lung disease) (HCC)   Iliac artery aneurysm   Followed by vascular surgery. Meeasuring 2.6cm on 07/09/2023      Relevant Medications   losartan  (COZAAR ) 50 MG tablet   Cigarette nicotine  dependence without complication   3-5 minute discussion regarding the harms of tobacco use, the benefits of cessation, and methods of cessation.  Discussed that there are medication options to help with cessation. Provided printed education on steps to quit smoking. Patient is not ready to try a medication to help.       Relevant Orders   Ambulatory referral to Pulmonology   Ambulatory Referral for Lung Cancer Scre   Skin lesion of face   Referral to dermatology, reported history of cancerous lesions      Relevant Orders   Ambulatory referral to Dermatology   Environmental and seasonal allergies   Symptoms including rhinorrhea and post-nasal drip. Start Claritin daily. Avoid  triggers. Has been counseled in the past on animals in and around the home.       Encounter to establish care with new provider - Primary   Today we reviewed medical history, health maintenance items, and current concerns. Labs today with 2-4 week follow up for HTN.      Other Visit Diagnoses       Screening for diabetes mellitus       Relevant Orders   Hemoglobin A1c       Return in about 3 weeks (around 11/21/2023) for CPE and BP follow up.   Jeoffrey GORMAN Barrio, FNP

## 2023-11-01 LAB — COMPREHENSIVE METABOLIC PANEL WITH GFR
AG Ratio: 1.3 (calc) (ref 1.0–2.5)
ALT: 8 U/L — ABNORMAL LOW (ref 9–46)
AST: 13 U/L (ref 10–35)
Albumin: 4 g/dL (ref 3.6–5.1)
Alkaline phosphatase (APISO): 45 U/L (ref 35–144)
BUN: 12 mg/dL (ref 7–25)
CO2: 27 mmol/L (ref 20–32)
Calcium: 8.8 mg/dL (ref 8.6–10.3)
Chloride: 106 mmol/L (ref 98–110)
Creat: 1.27 mg/dL (ref 0.70–1.28)
Globulin: 3 g/dL (ref 1.9–3.7)
Glucose, Bld: 92 mg/dL (ref 65–99)
Potassium: 4 mmol/L (ref 3.5–5.3)
Sodium: 139 mmol/L (ref 135–146)
Total Bilirubin: 0.4 mg/dL (ref 0.2–1.2)
Total Protein: 7 g/dL (ref 6.1–8.1)
eGFR: 61 mL/min/1.73m2 (ref >=60)

## 2023-11-01 LAB — CBC WITH DIFFERENTIAL/PLATELET
Absolute Lymphocytes: 3270 {cells}/uL (ref 850–3900)
Absolute Monocytes: 1042 {cells}/uL — ABNORMAL HIGH (ref 200–950)
Basophils Absolute: 56 {cells}/uL (ref 0–200)
Basophils Relative: 0.5 %
Eosinophils Absolute: 470 {cells}/uL (ref 15–500)
Eosinophils Relative: 4.2 %
HCT: 39.2 % (ref 38.5–50.0)
Hemoglobin: 13.3 g/dL (ref 13.2–17.1)
MCH: 31 pg (ref 27.0–33.0)
MCHC: 33.9 g/dL (ref 32.0–36.0)
MCV: 91.4 fL (ref 80.0–100.0)
MPV: 9.2 fL (ref 7.5–12.5)
Monocytes Relative: 9.3 %
Neutro Abs: 6362 {cells}/uL (ref 1500–7800)
Neutrophils Relative %: 56.8 %
Platelets: 246 Thousand/uL (ref 140–400)
RBC: 4.29 Million/uL (ref 4.20–5.80)
RDW: 13 % (ref 11.0–15.0)
Total Lymphocyte: 29.2 %
WBC: 11.2 Thousand/uL — ABNORMAL HIGH (ref 3.8–10.8)

## 2023-11-01 LAB — LIPID PANEL
Cholesterol: 141 mg/dL (ref <200)
HDL: 35 mg/dL — ABNORMAL LOW (ref >=40)
LDL Cholesterol (Calc): 83 mg/dL
Non-HDL Cholesterol (Calc): 106 mg/dL (ref <130)
Total CHOL/HDL Ratio: 4 (calc) (ref <5.0)
Triglycerides: 132 mg/dL (ref <150)

## 2023-11-01 LAB — HEMOGLOBIN A1C
Hgb A1c MFr Bld: 5.9 % — ABNORMAL HIGH (ref <5.7)
Mean Plasma Glucose: 123 mg/dL
eAG (mmol/L): 6.8 mmol/L

## 2023-11-04 ENCOUNTER — Ambulatory Visit: Payer: Self-pay | Admitting: Family Medicine

## 2023-11-06 ENCOUNTER — Encounter (INDEPENDENT_AMBULATORY_CARE_PROVIDER_SITE_OTHER): Payer: Self-pay | Admitting: Gastroenterology

## 2023-11-07 DIAGNOSIS — M25552 Pain in left hip: Secondary | ICD-10-CM | POA: Diagnosis not present

## 2023-11-07 DIAGNOSIS — M47818 Spondylosis without myelopathy or radiculopathy, sacral and sacrococcygeal region: Secondary | ICD-10-CM | POA: Diagnosis not present

## 2023-11-07 DIAGNOSIS — M961 Postlaminectomy syndrome, not elsewhere classified: Secondary | ICD-10-CM | POA: Diagnosis not present

## 2023-11-07 DIAGNOSIS — M461 Sacroiliitis, not elsewhere classified: Secondary | ICD-10-CM | POA: Diagnosis not present

## 2023-11-12 ENCOUNTER — Encounter: Payer: Self-pay | Admitting: Dermatology

## 2023-11-12 ENCOUNTER — Ambulatory Visit (INDEPENDENT_AMBULATORY_CARE_PROVIDER_SITE_OTHER): Admitting: Dermatology

## 2023-11-12 VITALS — BP 135/83

## 2023-11-12 DIAGNOSIS — L814 Other melanin hyperpigmentation: Secondary | ICD-10-CM | POA: Diagnosis not present

## 2023-11-12 DIAGNOSIS — L219 Seborrheic dermatitis, unspecified: Secondary | ICD-10-CM | POA: Diagnosis not present

## 2023-11-12 DIAGNOSIS — D1801 Hemangioma of skin and subcutaneous tissue: Secondary | ICD-10-CM | POA: Diagnosis not present

## 2023-11-12 DIAGNOSIS — L821 Other seborrheic keratosis: Secondary | ICD-10-CM

## 2023-11-12 DIAGNOSIS — W908XXA Exposure to other nonionizing radiation, initial encounter: Secondary | ICD-10-CM

## 2023-11-12 DIAGNOSIS — D0462 Carcinoma in situ of skin of left upper limb, including shoulder: Secondary | ICD-10-CM

## 2023-11-12 DIAGNOSIS — C44629 Squamous cell carcinoma of skin of left upper limb, including shoulder: Secondary | ICD-10-CM | POA: Diagnosis not present

## 2023-11-12 DIAGNOSIS — Z860101 Personal history of adenomatous and serrated colon polyps: Secondary | ICD-10-CM | POA: Insufficient documentation

## 2023-11-12 DIAGNOSIS — Z1283 Encounter for screening for malignant neoplasm of skin: Secondary | ICD-10-CM

## 2023-11-12 DIAGNOSIS — L570 Actinic keratosis: Secondary | ICD-10-CM

## 2023-11-12 DIAGNOSIS — D485 Neoplasm of uncertain behavior of skin: Secondary | ICD-10-CM | POA: Diagnosis not present

## 2023-11-12 NOTE — Progress Notes (Signed)
 New Patient Visit   Subjective  [name] is a 73 y.o. male who presents for a NEW PATIENT appointment to be examined for the concerns as listed below.   Spot Check: Pt stated that he has bumps that does not bleed at the temples that presented 3-4 years ago but never goes away. He stated that he did have some spots on the ears & B/L arms that was Tx by a dermatologist 4-5 years ago with cryotherapy.     No Hx of Bx. Reports family Hx of skin cancer ( mother & father - unsure of type).   The following portions of the chart were reviewed this encounter and updated as appropriate: medications, allergies, medical history  Review of Systems:  No other skin or systemic complaints except as noted in HPI or Assessment and Plan.  Objective  Well appearing patient in no apparent distress; mood and affect are within normal limits.   A focused examination was performed of the following areas: waist up FBSE   Relevant exam findings are noted in the Assessment and Plan.                Left Upper Arm - Anterior 1cm pink crusted papule  Left forearm superior 6mm pink crusted papule Left Forearm Inferior 8mm pink crusted papule face, B/L arms (35) Erythematous thin papules/macules with gritty scale.   Assessment & Plan   LENTIGINES, SEBORRHEIC KERATOSES, HEMANGIOMAS - Benign normal skin lesions - Benign-appearing - Call for any changes  MELANOCYTIC NEVI - Tan-brown and/or pink-flesh-colored symmetric macules and papules - Benign appearing on exam today - Observation - Call clinic for new or changing moles - Recommend daily use of broad spectrum spf 30+ sunscreen to sun-exposed areas.   ACTINIC DAMAGE - Chronic condition, secondary to cumulative UV/sun exposure - diffuse scaly erythematous macules with underlying dyspigmentation - Recommend daily broad spectrum sunscreen SPF 30+ to sun-exposed areas, reapply every 2 hours as needed.  - Staying in the shade or wearing  long sleeves, sun glasses (UVA+UVB protection) and wide brim hats (4-inch brim around the entire circumference of the hat) are also recommended for sun protection.  - Call for new or changing lesions.  SKIN CANCER SCREENING PERFORMED TODAY   ACTINIC KERATOSIS Exam: Erythematous thin papules/macules with gritty scale at the nose & B/L temples/cheeks  Treatment Plan: - Cryotherapy performed with liquid nitrogen   SEBORRHEIC DERMATITIS Exam: Pink patches with greasy scale at berad & scalp  flared  Treatment Plan: - Samples of CeraVe Zinc shampoo provided    NEOPLASM OF UNCERTAIN BEHAVIOR OF SKIN (3) Left Upper Arm - Anterior Skin / nail biopsy Type of biopsy: tangential   Informed consent: discussed and consent obtained   Timeout: patient name, date of birth, surgical site, and procedure verified   Procedure prep:  Patient was prepped and draped in usual sterile fashion Prep type:  Isopropyl alcohol Anesthesia: the lesion was anesthetized in a standard fashion   Anesthetic:  1% lidocaine  w/ epinephrine 1-100,000 buffered w/ 8.4% NaHCO3 Instrument used: DermaBlade   Hemostasis achieved with: aluminum  chloride   Outcome: patient tolerated procedure well   Post-procedure details: sterile dressing applied and wound care instructions given   Dressing type: petrolatum gauze and bandage    Specimen 1 - Surgical pathology Differential Diagnosis: r/o SCC  Check Margins: yes Left forearm superior Skin / nail biopsy Type of biopsy: tangential   Informed consent: discussed and consent obtained   Timeout: patient name, date  of birth, surgical site, and procedure verified   Procedure prep:  Patient was prepped and draped in usual sterile fashion Prep type:  Isopropyl alcohol Anesthesia: the lesion was anesthetized in a standard fashion   Anesthetic:  1% lidocaine  w/ epinephrine 1-100,000 buffered w/ 8.4% NaHCO3 Instrument used: DermaBlade   Hemostasis achieved with: aluminum   chloride   Outcome: patient tolerated procedure well   Post-procedure details: sterile dressing applied and wound care instructions given   Dressing type: petrolatum gauze and bandage    Specimen 2 - Surgical pathology Differential Diagnosis: r/o SCC  Check Margins: yes Left Forearm Inferior Skin / nail biopsy Type of biopsy: tangential   Informed consent: discussed and consent obtained   Timeout: patient name, date of birth, surgical site, and procedure verified   Procedure prep:  Patient was prepped and draped in usual sterile fashion Prep type:  Isopropyl alcohol Anesthesia: the lesion was anesthetized in a standard fashion   Anesthetic:  1% lidocaine  w/ epinephrine 1-100,000 buffered w/ 8.4% NaHCO3 Instrument used: DermaBlade   Hemostasis achieved with: aluminum  chloride   Outcome: patient tolerated procedure well   Post-procedure details: sterile dressing applied and wound care instructions given   Dressing type: petrolatum gauze and bandage    Specimen 3 - Surgical pathology Differential Diagnosis: r/o SCC  Check Margins: yes AK (ACTINIC KERATOSIS) (35) face, B/L arms (35) Destruction of lesion - face, B/L arms (35) Complexity: simple   Destruction method: cryotherapy   Informed consent: discussed and consent obtained   Timeout:  patient name, date of birth, surgical site, and procedure verified Lesion destroyed using liquid nitrogen: Yes   Region frozen until ice ball extended beyond lesion: Yes   Outcome: patient tolerated procedure well with no complications   Post-procedure details: wound care instructions given     Return in about 6 months (around 05/12/2024) for AK.   Documentation: I have reviewed the above documentation for accuracy and completeness, and I agree with the above.  I, Shirron Maranda, CMA, am acting as scribe for [employer], DO.   Delon Lenis, DO

## 2023-11-12 NOTE — Patient Instructions (Addendum)

## 2023-11-14 ENCOUNTER — Encounter: Payer: Self-pay | Admitting: Dermatology

## 2023-11-14 ENCOUNTER — Ambulatory Visit: Payer: Self-pay | Admitting: Dermatology

## 2023-11-14 DIAGNOSIS — C4492 Squamous cell carcinoma of skin, unspecified: Secondary | ICD-10-CM | POA: Insufficient documentation

## 2023-11-14 DIAGNOSIS — D099 Carcinoma in situ, unspecified: Secondary | ICD-10-CM | POA: Insufficient documentation

## 2023-11-14 LAB — SURGICAL PATHOLOGY

## 2023-11-14 NOTE — Progress Notes (Signed)
 Hi Shirron, Please call pt and notify their bx results were positive for a skin CA that will be excised by Dr. Corey   Diagnosis 1. Skin , left upper arm - anterior --> SE w/ Dr SHAUNNA WELL DIFFERENTIATED SQUAMOUS CELL CARCINOMA, DEEP MARGIN INVOLVED  2. Skin , left forearm superior --> ED&C w/ Dr Alm SCHOOLING CELL CARCINOMA IN SITU, PERIPHERAL MARGIN INVOLVED  3. Skin , left forearm inferior --> SE w/ Dr Corey WELL DIFFERENTIATED SQUAMOUS CELL CARCINOMA, DEEP MARGIN INVOLVED

## 2023-11-21 ENCOUNTER — Other Ambulatory Visit

## 2023-11-21 DIAGNOSIS — Z0000 Encounter for general adult medical examination without abnormal findings: Secondary | ICD-10-CM

## 2023-11-22 LAB — CBC WITH DIFFERENTIAL/PLATELET
Absolute Lymphocytes: 3279 {cells}/uL (ref 850–3900)
Absolute Monocytes: 737 {cells}/uL (ref 200–950)
Basophils Absolute: 58 {cells}/uL (ref 0–200)
Basophils Relative: 0.6 %
Eosinophils Absolute: 398 {cells}/uL (ref 15–500)
Eosinophils Relative: 4.1 %
HCT: 44.8 % (ref 38.5–50.0)
Hemoglobin: 15.1 g/dL (ref 13.2–17.1)
MCH: 30.6 pg (ref 27.0–33.0)
MCHC: 33.7 g/dL (ref 32.0–36.0)
MCV: 90.7 fL (ref 80.0–100.0)
MPV: 9 fL (ref 7.5–12.5)
Monocytes Relative: 7.6 %
Neutro Abs: 5228 {cells}/uL (ref 1500–7800)
Neutrophils Relative %: 53.9 %
Platelets: 290 Thousand/uL (ref 140–400)
RBC: 4.94 Million/uL (ref 4.20–5.80)
RDW: 12.8 % (ref 11.0–15.0)
Total Lymphocyte: 33.8 %
WBC: 9.7 Thousand/uL (ref 3.8–10.8)

## 2023-11-22 LAB — LIPID PANEL
Cholesterol: 167 mg/dL (ref <200)
HDL: 38 mg/dL — ABNORMAL LOW (ref >=40)
LDL Cholesterol (Calc): 106 mg/dL — ABNORMAL HIGH
Non-HDL Cholesterol (Calc): 129 mg/dL (ref <130)
Total CHOL/HDL Ratio: 4.4 (calc) (ref <5.0)
Triglycerides: 136 mg/dL (ref <150)

## 2023-11-22 LAB — COMPREHENSIVE METABOLIC PANEL WITH GFR
AG Ratio: 1.3 (calc) (ref 1.0–2.5)
ALT: 10 U/L (ref 9–46)
AST: 14 U/L (ref 10–35)
Albumin: 4.2 g/dL (ref 3.6–5.1)
Alkaline phosphatase (APISO): 43 U/L (ref 35–144)
BUN/Creatinine Ratio: 10 (calc) (ref 6–22)
BUN: 13 mg/dL (ref 7–25)
CO2: 28 mmol/L (ref 20–32)
Calcium: 9.2 mg/dL (ref 8.6–10.3)
Chloride: 105 mmol/L (ref 98–110)
Creat: 1.3 mg/dL — ABNORMAL HIGH (ref 0.70–1.28)
Globulin: 3.3 g/dL (ref 1.9–3.7)
Glucose, Bld: 121 mg/dL — ABNORMAL HIGH (ref 65–99)
Potassium: 4.3 mmol/L (ref 3.5–5.3)
Sodium: 139 mmol/L (ref 135–146)
Total Bilirubin: 0.4 mg/dL (ref 0.2–1.2)
Total Protein: 7.5 g/dL (ref 6.1–8.1)
eGFR: 59 mL/min/1.73m2 — ABNORMAL LOW (ref >=60)

## 2023-11-22 LAB — TSH: TSH: 1.16 m[IU]/L (ref 0.40–4.50)

## 2023-11-28 ENCOUNTER — Encounter: Payer: Self-pay | Admitting: Family Medicine

## 2023-11-28 ENCOUNTER — Ambulatory Visit: Admitting: Family Medicine

## 2023-11-28 VITALS — BP 119/78 | HR 64 | Temp 98.0°F | Ht 68.0 in | Wt 172.4 lb

## 2023-11-28 DIAGNOSIS — J439 Emphysema, unspecified: Secondary | ICD-10-CM

## 2023-11-28 DIAGNOSIS — R911 Solitary pulmonary nodule: Secondary | ICD-10-CM

## 2023-11-28 DIAGNOSIS — F17210 Nicotine dependence, cigarettes, uncomplicated: Secondary | ICD-10-CM

## 2023-11-28 DIAGNOSIS — C4492 Squamous cell carcinoma of skin, unspecified: Secondary | ICD-10-CM

## 2023-11-28 DIAGNOSIS — I10 Essential (primary) hypertension: Secondary | ICD-10-CM

## 2023-11-28 DIAGNOSIS — Z0000 Encounter for general adult medical examination without abnormal findings: Secondary | ICD-10-CM | POA: Insufficient documentation

## 2023-11-28 DIAGNOSIS — I723 Aneurysm of iliac artery: Secondary | ICD-10-CM

## 2023-11-28 DIAGNOSIS — J849 Interstitial pulmonary disease, unspecified: Secondary | ICD-10-CM | POA: Diagnosis not present

## 2023-11-28 DIAGNOSIS — E782 Mixed hyperlipidemia: Secondary | ICD-10-CM | POA: Diagnosis not present

## 2023-11-28 DIAGNOSIS — N1831 Chronic kidney disease, stage 3a: Secondary | ICD-10-CM

## 2023-11-28 DIAGNOSIS — Z0001 Encounter for general adult medical examination with abnormal findings: Secondary | ICD-10-CM | POA: Diagnosis not present

## 2023-11-28 DIAGNOSIS — K219 Gastro-esophageal reflux disease without esophagitis: Secondary | ICD-10-CM

## 2023-11-28 DIAGNOSIS — R7303 Prediabetes: Secondary | ICD-10-CM

## 2023-11-28 MED ORDER — FAMOTIDINE 20 MG PO TABS: 20.0000 mg | ORAL_TABLET | Freq: Every day | ORAL | 1 refills | Status: AC

## 2023-11-28 MED ORDER — ROSUVASTATIN CALCIUM 5 MG PO TABS: 5.0000 mg | ORAL_TABLET | Freq: Every day | ORAL | 1 refills | Status: AC

## 2023-11-28 NOTE — Assessment & Plan Note (Signed)
 3-5 minute discussion regarding the harms of tobacco use, the benefits of cessation, and methods of cessation. Discussed that there are medication options to help with cessation. Provided printed education on steps to quit smoking. Patient is not ready to try a medication to help.

## 2023-11-28 NOTE — Progress Notes (Signed)
 Complete physical exam  Patient: [name]   DOB: 04-18-1950   73 y.o. Male  MRN: [id number]  Subjective:    Chief Complaint  Patient presents with   Annual Exam    CPE    [name] is a 73 y.o. male who presents today for a complete physical exam. He reports consuming a general diet. The patient does not participate in regular exercise at present. He generally feels well. He reports sleeping well. He does not have additional problems to discuss today.   PMH includes HTN, infrarenal AAA, ILD, emphysema, pulmonary nodules, nicotine  dependence, Barrett's esophagus, and chronic pain.   HTN: well controlled on losartan    HLD:  Lipid Panel     Component Value Date/Time   CHOL 167 11/21/2023 0840   TRIG 136 11/21/2023 0840   HDL 38 (L) 11/21/2023 0840   CHOLHDL 4.4 11/21/2023 0840   VLDL 21 02/04/2017 1330   LDLCALC 106 (H) 11/21/2023 0840   The 10-year ASCVD risk score (Arnett DK, et al., 2019) is: 23.1%   Values used to calculate the score:     Age: 82 years     Clincally relevant sex: Male     Is Non-Hispanic African American: No     Diabetic: No     Tobacco smoker: Yes     Systolic Blood Pressure: 119 mmHg     Is BP treated: Yes     HDL Cholesterol: 38 mg/dL     Total Cholesterol: 167 mg/dL  Prediabetes: diet controlled Lab Results  Component Value Date   HGBA1C 5.9 (H) 10/31/2023   HGBA1C 5.5 02/03/2017    CKD 3a:  Infrarenal AAA: CAD: ILD: Emphysema: on Albuterol  Pulmonary nodules: declined biopsy Nicotine  dependence: GERD: with barrett's esophagus  Chronic pain: followed by pain; due to hip osteoarthritis, sacroiliitis, sacral spondylosis  Discussed the use of AI scribe software for clinical note transcription with the patient, who gave verbal consent to proceed.  History of Present Illness [name] is a 73 year old male with skin cancer and stage 3 chronic kidney disease who presents for follow-up on multiple health issues.  He is  following up on his skin cancer diagnosis. Dermatologists plan to excise lesions on his arm, but he received a letter indicating the need for further consultation before the procedure. He has difficulty coordinating appointments due to frequent calls from insurance companies.  He has a history of prediabetes and chronic kidney disease, currently at stage 3 with a GFR of 59. He uses losartan  for blood pressure management. He was previously using NSAIDs like ibuprofen for arthritis pain.  He uses albuterol  for respiratory issues, approximately once a day or less frequently. He experiences shortness of breath and wheezing, especially when walking up inclines.  He experiences symptoms of acid reflux, with pain and a sensation of food getting stuck in his throat during meals. He has a history of Barrett's esophagus and underwent an EGD and colonoscopy in April 2025, where some polyps were removed. He has tried various medications for reflux, including Nexium , which was effective but later replaced with a less effective generic.  He has a history of high cholesterol, which was previously managed with medication. He reports dietary challenges, often eating out due to raising two granddaughters, aged 30 and 69, who prefer fast food.  He has a history of smoking and has expressed mixed feelings about quitting. He experiences chronic shortness of breath.  He is currently taking losartan , aspirin ,  oxycodone , and albuterol . He discontinued loratadine due to side effects. He has been on cholesterol medication in the past.     Most recent fall risk assessment:    11/28/2023    8:43 AM  Fall Risk   Falls in the past year? 0  Number falls in past yr: 0  Injury with Fall? 0  Risk for fall due to : No Fall Risks  Follow up Falls evaluation completed     Most recent depression screenings:    11/28/2023    8:43 AM 10/31/2023   11:27 AM  PHQ 2/9 Scores  PHQ - 2 Score 0 0  PHQ- 9 Score 1  2      Data  saved with a previous flowsheet row definition    Vision:Not within last year , Dental: No current dental problems, and PSA: Prostate cancer screening and PSA options (with potential risks and benefits of testing vs. not testing) were discussed along with recent recs/guidelines.   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 s/p EVAR 05/2010.   Back pain    Barrett's esophagus    CKD (chronic kidney disease), stage II    Degenerative disk disease    a. Chronic back pain with ridiculopathy.   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 3mm nodule seen on CT 09/2014 - f/u recommended 1 yr.   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   left forearm superior --> ED&C needed    Tobacco abuse    Past Surgical History:  Procedure Laterality Date   ABDOMINAL AORTIC ENDOVASCULAR STENT GRAFT  2014   APPENDECTOMY     BACK SURGERY     BIOPSY N/A 06/10/2013   Procedure: BIOPSY;  Surgeon: Claudis RAYMOND Rivet, MD;  Location: [facility];  Service: Endoscopy;  Laterality: N/A;   BIOPSY  12/03/2014   Procedure: BIOPSY (Gastroesophageal Junction);  Surgeon: Claudis RAYMOND Rivet, MD;  Location: [facility];  Service: Endoscopy;;   ESOPHAGEAL DILATION N/A 12/03/2014   Procedure: ESOPHAGEAL DILATION WITH 54FR AND 56FR MALONEY;  Surgeon: Claudis RAYMOND Rivet, MD;  Location: [facility];  Service: Endoscopy;  Laterality: N/A;   ESOPHAGOGASTRODUODENOSCOPY (EGD) WITH PROPOFOL  N/A 06/10/2013   Procedure: ESOPHAGOGASTRODUODENOSCOPY (EGD) WITH PROPOFOL ;  Surgeon: Claudis RAYMOND Rivet, MD;  Location: [facility];  Service: Endoscopy;  Laterality: N/A;   ESOPHAGOGASTRODUODENOSCOPY (EGD) WITH PROPOFOL  N/A 12/03/2014   Procedure: ESOPHAGOGASTRODUODENOSCOPY (EGD) WITH PROPOFOL ;  Surgeon: Claudis RAYMOND Rivet, MD;  Location: [facility];  Service: Endoscopy;  Laterality: N/A;   EVAR  06/20/10   for AAA repair    LEFT HEART CATH AND CORONARY ANGIOGRAPHY N/A 02/04/2017   Procedure: LEFT HEART CATH AND CORONARY ANGIOGRAPHY;  Surgeon: Court Dorn PARAS, MD;  Location: [facility];  Service: Cardiovascular;  Laterality: N/A;   MALONEY DILATION N/A 06/10/2013   Procedure: MALONEY DILATION 52 fr, 54 fr;  Surgeon: Claudis RAYMOND Rivet, MD;  Location: [facility];  Service: Endoscopy;  Laterality: N/A;   SPINAL FUSION     of neck and lower back   VIDEO BRONCHOSCOPY Bilateral 02/24/2018   Procedure: VIDEO BRONCHOSCOPY WITH FLUORO;  Surgeon: [name], Praveen, MD;  Location: [facility];  Service: Cardiopulmonary;  Laterality: Bilateral;   Social History   Tobacco Use   Smoking status: Every Day    Current packs/day:  1.00    Average packs/day: 1 pack/day for 57.9 years (57.9 ttl pk-yrs)    Types: Cigarettes    Start date: 12/16/1965   Smokeless tobacco: Never  Vaping Use   Vaping status: Never Used  Substance Use Topics   Alcohol use: No    Alcohol/week: 0.0 standard drinks of alcohol   Drug use: No   Family History  Problem Relation Age of Onset   Atrial fibrillation Mother    Heart disease Mother        Patient unclear of what kind   Deep vein thrombosis Mother    Hyperlipidemia Mother    Hypertension Mother    Stroke Father    Diabetes Father    Heart disease Father        Patient unclear of what kind   Kidney disease Father    Hyperlipidemia Father    Hypertension Father    Diabetes Sister    Heart disease Sister        Patient unclear of what kind   Brain cancer Sister    Seizures Sister    Cancer Paternal Grandmother    Deep vein thrombosis Other    Pulmonary embolism Other    Lung disease Neg Hx    Colon cancer Neg Hx    Esophageal cancer Neg Hx    Rectal cancer Neg Hx    Stomach cancer Neg Hx    Allergies  Allergen Reactions   Benazepril Hcl Shortness Of Breath and Other (See Comments)    Can't breathe   Pantoprazole Sodium Shortness Of Breath and Other (See Comments)    Can't breathe   Amlodipine  Swelling and Other (See Comments)    Location of swelling not recalled by the patient      Patient Care Team: Kayla Jeoffrey RAMAN, FNP as PCP - General (Family Medicine) Wonda Sharper, MD as PCP - Cardiology (Cardiology) Mavis Purchase, MD (Neurosurgery)   Outpatient Medications Prior to Visit  Medication Sig   albuterol  (PROAIR  HFA) 108 (90 Base) MCG/ACT inhaler Inhale 2 puffs into the lungs every 6 (six) hours as needed.   aspirin  EC 81 MG tablet Take 81 mg by mouth daily.   loratadine (CLARITIN) 10 MG tablet Take 1  tablet (10 mg total) by mouth daily. (Patient taking differently: Take 10 mg by mouth daily as needed for allergies or rhinitis.)   losartan  (COZAAR )  50 MG tablet Take 1 tablet (50 mg total) by mouth daily.   Oxycodone  HCl 10 MG TABS Take 10 mg by mouth 5 (five) times daily as needed.   No facility-administered medications prior to visit.    Review of Systems  All other systems reviewed and are negative.         Objective:     BP 119/78   Pulse 64   Temp 98 F (36.7 C)   Ht 5' 8 (1.727 m)   Wt 172 lb 6.4 oz (78.2 kg)   SpO2 95%   BMI 26.21 kg/m  BP Readings from Last 3 Encounters:  11/28/23 119/78  11/12/23 135/83  10/31/23 128/70   Wt Readings from Last 3 Encounters:  11/28/23 172 lb 6.4 oz (78.2 kg)  10/31/23 170 lb 0.3 oz (77.1 kg)  07/09/23 166 lb (75.3 kg)      Physical Exam Vitals and nursing note reviewed.  Constitutional:      Appearance: Normal appearance. He is normal weight.  HENT:     Head: Normocephalic and atraumatic.  Cardiovascular:     Rate and Rhythm: Normal rate and regular rhythm.     Pulses: Normal pulses.     Heart sounds: Normal heart sounds.  Pulmonary:     Effort: Pulmonary effort is normal.     Breath sounds: Normal breath sounds.  Skin:    General: Skin is warm and dry.     Capillary Refill: Capillary refill takes less than 2 seconds.  Neurological:     General: No focal deficit present.     Mental Status: He is alert and oriented to person, place, and time. Mental status is at baseline.  Psychiatric:        Mood and Affect: Mood normal.        Behavior: Behavior normal.        Thought Content: Thought content normal.        Judgment: Judgment normal.      No results found for any visits on 11/28/23. Last CBC Lab Results  Component Value Date   WBC 9.7 11/21/2023   HGB 15.1 11/21/2023   HCT 44.8 11/21/2023   MCV 90.7 11/21/2023   MCH 30.6 11/21/2023   RDW 12.8 11/21/2023   PLT 290 11/21/2023   Last metabolic panel Lab Results  Component Value Date   GLUCOSE 121 (H) 11/21/2023   NA 139 11/21/2023   K 4.3 11/21/2023   CL 105 11/21/2023   CO2 28 11/21/2023    BUN 13 11/21/2023   CREATININE 1.30 (H) 11/21/2023   EGFR 59 (L) 11/21/2023   CALCIUM  9.2 11/21/2023   PROT 7.5 11/21/2023   ALBUMIN 3.9 04/08/2017   BILITOT 0.4 11/21/2023   ALKPHOS 51 04/08/2017   AST 14 11/21/2023   ALT 10 11/21/2023   ANIONGAP 10 04/08/2017   Last lipids Lab Results  Component Value Date   CHOL 167 11/21/2023   HDL 38 (L) 11/21/2023   LDLCALC 106 (H) 11/21/2023   TRIG 136 11/21/2023   CHOLHDL 4.4 11/21/2023   Last hemoglobin A1c Lab Results  Component Value Date   HGBA1C 5.9 (H) 10/31/2023   Last thyroid  functions Lab Results  Component Value Date   TSH 1.16 11/21/2023   Last vitamin D No results found for: 25OHVITD2, 25OHVITD3, VD25OH Last vitamin B12  and Folate No results found for: VITAMINB12, FOLATE      Assessment & Plan:    Routine Health Maintenance and Physical Exam  There is no immunization history for the selected administration types on file for this patient.  Health Maintenance  Topic Date Due   Lung Cancer Screening  02/16/2021   Medicare Annual Wellness (AWV)  01/02/2024   COVID-19 Vaccine (1) 12/14/2023 (Originally 12/16/1955)   Zoster Vaccines- Shingrix (1 of 2) 02/28/2024 (Originally 12/17/1969)   Influenza Vaccine  04/21/2024 (Originally 08/23/2023)   DTaP/Tdap/Td (1 - Tdap) 11/27/2024 (Originally 12/17/1969)   Pneumococcal Vaccine: 50+ Years (1 of 2 - PCV) 11/27/2024 (Originally [birth year]17/1969)   Hepatitis C Screening  11/27/2024 (Originally 12/17/1968)   Colonoscopy  06/03/2028   Meningococcal B Vaccine  Aged Out    Discussed health benefits of physical activity, and encouraged him to engage in regular exercise appropriate for his age and condition.  Problem List Items Addressed This Visit     HTN (hypertension)   Relevant Medications   rosuvastatin (CRESTOR) 5 MG tablet   GERD (gastroesophageal reflux disease)   Relevant Medications   famotidine  (PEPCID ) 20 MG tablet   Pulmonary nodule   CKD (chronic  kidney disease), stage II   Emphysema of lung (HCC)   ILD (interstitial lung disease) (HCC)   Iliac artery aneurysm   Relevant Medications   rosuvastatin (CRESTOR) 5 MG tablet   Cigarette nicotine  dependence without complication   3-5 minute discussion regarding the harms of tobacco use, the benefits of cessation, and methods of cessation. Discussed that there are medication options to help with cessation. Provided printed education on steps to quit smoking. Patient is not ready to try a medication to help.       SCC (squamous cell carcinoma)   Mixed hyperlipidemia   Relevant Medications   rosuvastatin (CRESTOR) 5 MG tablet   Prediabetes   Physical exam, annual - Primary   Today your medical history was reviewed and routine physical exam with labs was performed. Recommend 150 minutes of moderate intensity exercise weekly and consuming a well-balanced diet. Advised to stop smoking if a smoker, avoid smoking if a non-smoker, limit alcohol consumption to 1 drink per day for women and 2 drinks per day for men, and avoid illicit drug use.  Vaccine maintenance discussed. Appropriate health maintenance items reviewed. Return to office in 1 year for annual physical exam.       Assessment and Plan Assessment & Plan Skin cancer (carcinoma) of face and arm Positive for skin carcinoma on face and arm. Dermatology recommended excision. - Schedule appointment with dermatology for excision of skin lesions.  Chronic kidney disease, stage 3a Stage 3a CKD with GFR of 59. Advised to avoid NSAIDs. - Continue losartan  for kidney protection. - Advised to stay hydrated and avoid NSAIDs.  Mixed hyperlipidemia Cholesterol levels not at goal with a 10-year cardiovascular risk of 23%. - Started low dose rosuvastatin.  Essential (primary) hypertension Hypertension managed with losartan . - Continue losartan .  Barrett's esophagus with dysphagia and reflux symptoms Barrett's esophagus with worsening  dysphagia and reflux symptoms. Allergic to pantoprazole and famotidine . - Started famotidine  (Pepcid ) once daily, increase to twice daily if needed.  Prediabetes Noted on recent blood work.  Environmental and seasonal allergies Previous adverse reaction to loratadine. Considering alternative antihistamines. - Try cetirizine (Zyrtec) for allergy  symptoms.  Chronic pain due to arthritis Chronic pain managed with oxycodone . NSAIDs not recommended due to kidney disease. - Continue oxycodone  as needed for pain  with pain management.  Chronic pulmonary disease with shortness of breath Chronic pulmonary disease with frequent shortness of breath. Uses albuterol  inhaler as needed. - Discuss inhaler use with pulmonologist at upcoming appointment.  Nicotine  dependence, cigarettes Long-standing nicotine  dependence. Expressed ambivalence about quitting. - Offered nicotine  patches if interested in quitting.    Return in about 3 months (around 02/28/2024) for chronic follow-up with labs 1 week prior.     Jeoffrey GORMAN Barrio, FNP

## 2023-11-28 NOTE — Assessment & Plan Note (Signed)
Today your medical history was reviewed and routine physical exam with labs was performed. Recommend 150 minutes of moderate intensity exercise weekly and consuming a well-balanced diet. Advised to stop smoking if a smoker, avoid smoking if a non-smoker, limit alcohol consumption to 1 drink per day for women and 2 drinks per day for men, and avoid illicit drug use. Vaccine maintenance discussed. Appropriate health maintenance items reviewed. Return to office in 1 year for annual physical exam.

## 2023-12-04 ENCOUNTER — Ambulatory Visit (INDEPENDENT_AMBULATORY_CARE_PROVIDER_SITE_OTHER): Admitting: Internal Medicine

## 2023-12-04 ENCOUNTER — Encounter: Payer: Self-pay | Admitting: Internal Medicine

## 2023-12-04 ENCOUNTER — Telehealth: Payer: Self-pay | Admitting: Internal Medicine

## 2023-12-04 VITALS — BP 153/84 | HR 75 | Ht 68.0 in | Wt 171.0 lb

## 2023-12-04 DIAGNOSIS — R1319 Other dysphagia: Secondary | ICD-10-CM

## 2023-12-04 DIAGNOSIS — R0609 Other forms of dyspnea: Secondary | ICD-10-CM | POA: Diagnosis not present

## 2023-12-04 DIAGNOSIS — F17210 Nicotine dependence, cigarettes, uncomplicated: Secondary | ICD-10-CM | POA: Diagnosis not present

## 2023-12-04 DIAGNOSIS — J4531 Mild persistent asthma with (acute) exacerbation: Secondary | ICD-10-CM | POA: Diagnosis not present

## 2023-12-04 MED ORDER — CEFDINIR 300 MG PO CAPS: 300.0000 mg | ORAL_CAPSULE | Freq: Two times a day (BID) | ORAL | 0 refills | Status: DC

## 2023-12-04 NOTE — Assessment & Plan Note (Addendum)
 Counseled re importance of smoking cessation but did not meet time criteria for separate billing     Each maintenance medication was reviewed in detail including emphasizing most importantly the difference between maintenance and prns and under what circumstances the prns are to be triggered using an action plan format where appropriate.  Total time for H and P, chart review, counseling, reviewing hfa device(s) , directly observing portions of ambulatory 02 saturation study/ and generating customized AVS unique to this office visit / same day charting = 61 min for pt not seen in > 3 y

## 2023-12-04 NOTE — Assessment & Plan Note (Addendum)
 Active smoker - PFT's  02/16/2015 nl flows/vols s rx  prior to study with DLCO  55/58 % corrects to 74 % for alv volume   - Allergy  screen 12/04/2023 >  Eos 0. /  alpha one AT phenotype pending  - 12/04/2023  After extensive coaching inhaler device,  effectiveness =    75% from  baseline near 0 > continue saba prn  plus omnicef 300 mg bid x10 d and max mucolytics if tolerates   Re SABA :  I spent extra time with pt today reviewing appropriate use of albuterol  for prn use on exertion with the following points: 1) saba is for relief of sob that does not improve by walking a slower pace or resting but rather if the pt does not improve after trying this first. 2) If the pt is convinced, as many are, that saba helps recover from activity faster then it's easy to tell if this is the case by re-challenging : ie stop, take the inhaler, then p 5 minutes try the exact same activity (intensity of workload) that just caused the symptoms and see if they are substantially diminished or not after saba 3) if there is an activity that reproducibly causes the symptoms, try the saba 15 min before the activity on alternate days   If in fact the saba really does help, then fine to continue to use it prn but advised may need to look closer at the maintenance regimen being used (for now =0 as not convince needs maint rx)  to achieve better control of airways disease with exertion.

## 2023-12-04 NOTE — Patient Instructions (Addendum)
 My office will be contacting you by phone for referral to [facility] for diagnostic  esophagram   - if you don't hear back from my office within one week please call us  back or notify us  thru MyChart and we'll address it right away.    Best cough medication > Robitussin DM  (per bottle)  or   mucinex dm (same medication but need to crush)  Ominicef 300  mg twice daily x 10 days  Use your albuterol  as a rescue medication to be used if you can't catch your breath by resting, slowing your pace,  or doing a relaxed purse lip breathing pattern.  - The less you use it, the better it will work when you need it. - Ok to use up to 2 puffs  every 4 hours if you must but call for  appointment if use goes up over your usual need - Don't leave home without it !!  (think of it like a spare tire or starter fluid for your car)   Also  Ok to try albuterol  15 min before an activity (on alternating days)  that you know would usually make you short of breath and see if it makes any difference and if makes none then don't take albuterol  after activity unless you can't catch your breath as this means it's the resting that helps, not the albuterol .   Work on inhaler technique:  relax and gently blow all the way out then take a nice smooth full deep breath back in, triggering the inhaler at same time you start breathing in.  Hold breath in for at least  5 seconds if you can.       Increase Pepcid  20mg  to take it after you get up and after supper   GERD (REFLUX)  is an extremely common cause of respiratory symptoms just like yours , many times with no obvious heartburn at all.    It can be treated with medication, but also with lifestyle changes including elevation of the head of your bed (ideally with 6 -8inch blocks under the headboard of your bed),  Smoking cessation, avoidance of late meals, excessive alcohol, and avoid fatty foods, chocolate, peppermint, colas, red wine, and acidic juices such as orange juice.  NO  MINT OR MENTHOL PRODUCTS SO NO COUGH DROPS  USE SUGARLESS CANDY INSTEAD (Jolley ranchers or Stover's or Life Savers) or even ice chips will also do - the key is to swallow to prevent all throat clearing. NO OIL BASED VITAMINS - use powdered substitutes.  Avoid fish oil when coughing.    Please schedule a follow up office visit in 6 weeks, call sooner if needed with all medications /inhalers/ solutions in hand so we can verify exactly what you are taking. This includes all medications from all doctors and over the counters

## 2023-12-04 NOTE — Assessment & Plan Note (Addendum)
 DgEs 03/01/16   1. Mild esophageal dysmotility, likely presbyesophagus. 2. Small hiatal hernia. Similar appearance of a mild stricture at GE junction 3. Delayed passage of 13 mm barium tablet - Repeat Dg Es ordered 12/04/2023 >>>   I suspect most of his problems related to dysphagia which in turn is exac  by GERD with prior h/o Barrett's but reported severe reaction to PPI (can't breathe) which pt does not recall.   Rec max gerd diet/ H2 rx and f/u with DgES aspap then back to GI for f/u

## 2023-12-04 NOTE — Telephone Encounter (Signed)
 Spoke with patient regarding the Monday 12/08/2509:30 am [name] appointment at [facility] time is 11:15 am---1st floor registration desk for check in.  I will mail the information to the patient and he voiced his understanding

## 2023-12-04 NOTE — Assessment & Plan Note (Addendum)
 Active smoker -PFT's 02/16/2015 nl flows/vols s rx prior to study with DLCO 55/58 % corrects to 74 % for alv volume.  - 12/04/2023   Walked on  RA  x   3   lap(s) =  approx 450  ft  @ mod fast pace, stopped due to end of sutdy  with lowest 02 sats 94%   and improved to 96% before stopping s sob   No evidence of copd or progressive ILD at this point with the main concern being ? Chronic asp based on es dsyfunction > w/u in progress and no additional w/u needed for now

## 2023-12-04 NOTE — Progress Notes (Addendum)
 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 Still has bird in house  Last eval by [name] 01/2020 so > 3 y since last ov     PFT's 02/16/2015 nl flows/vols s rx prior to study with DLCO 55/58 % corrects to 74 % for alv volume.    History of Present Illness  12/04/2023  Pulmonary/ 1st office eval/ [name] / [facility] active smoker / had been on nexium  but now listed as severe allergy  to protonix/ on pepcid  20 mg per day with overt HB  drinking lots of Dr Nunzio and eating chocolate Chief Complaint  Patient presents with   Establish Care    LCS  Couging w green mucus - shob   Dyspnea:  slow pace anywhere / no steps  Cough: eating or drinking but also happens  Sleep: on couch because bedroom is too cold / uses one pillow and arm rest wakes up most nights swallows Dr Nunzio then back to bed  SABA use: once or twice a day  02 use: none   No obvious day to day or daytime pattern/variability or assoc excess/ purulent sputum or mucus plugs or hemoptysis or cp or chest tightness, subjective wheeze or overt sinus  symptoms.    Also denies any obvious fluctuation of symptoms with weather or environmental changes or other aggravating or alleviating factors except as outlined above   No unusual exposure hx or h/o childhood pna/ asthma or knowledge of premature birth.  Current Allergies, Complete Past Medical History, Past Surgical History, Family History, and Social History were reviewed in [employer] record.  ROS  The following are not active complaints unless bolded Hoarseness, sore throat, dysphagia, dental problems, itching, sneezing,  nasal congestion or discharge of excess mucus or purulent secretions, ear ache,   fever, chills, sweats, unintended wt loss or wt gain, classically  pleuritic or exertional cp,  orthopnea pnd or arm/hand swelling  or leg swelling, presyncope, palpitations, abdominal pain, anorexia, nausea, vomiting, diarrhea  or change in bowel habits or change in bladder habits, change in stools or change in urine, dysuria, hematuria,  rash, arthralgias, visual complaints, headache, numbness, weakness or ataxia or problems with walking or coordination,  change in mood or  memory.            Outpatient Medications Prior to Visit  Medication Sig Dispense Refill   albuterol  (PROAIR  HFA) 108 (90 Base) MCG/ACT inhaler Inhale 2 puffs into the lungs every 6 (six) hours as needed. 8.5 g 3   aspirin  EC 81 MG tablet Take 81 mg by mouth daily.     famotidine  (PEPCID ) 20 MG tablet Take 1 tablet (20 mg total) by mouth daily. 90 tablet 1   loratadine (CLARITIN) 10 MG tablet Take 1 tablet (10 mg total) by mouth daily. (Patient taking differently: Take 10 mg by mouth daily as needed for allergies or rhinitis.) 30 tablet 11   losartan  (COZAAR ) 50 MG tablet Take 1 tablet (50 mg total) by mouth daily. 90 tablet 1   Oxycodone  HCl 10 MG TABS Take 10 mg by mouth 5 (five) times daily as needed.     rosuvastatin (CRESTOR) 5 MG tablet Take 1 tablet (5 mg total) by mouth daily. 90 tablet 1   No facility-administered  medications prior to visit.    Past Medical History:  Diagnosis Date   AAA (abdominal aortic aneurysm)    a. s/p EVAR 05/2010.   Back pain    Barrett's esophagus    CKD (chronic kidney disease), stage II    Degenerative disk disease    a. Chronic back pain with ridiculopathy.   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 3mm nodule seen on CT 09/2014 - f/u recommended 1 yr.   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   left forearm superior --> ED&C needed    Tobacco abuse       Objective:     BP (!) 153/84   Pulse 75   Ht 5' 8 (1.727 m)   Wt 171 lb (77.6 kg)   SpO2 93% Comment: ra  BMI 26.00 kg/m   SpO2: 93 % (ra) amb elderly wm/ congested sounding cough       HEENT : Oropharynx  clear / edentulous     Nasal turbinates  mod edema L>R    NECK :  without  apparent JVD/ palpable Nodes/TM    LUNGS: no acc muscle use,  Nl contour chest with a few insp crackles bases  with  cough on insp   maneuvers   CV:  RRR  no s3 or murmur or increase in P2, and no edema   ABD:  soft and nontender   MS:  Gait nl   ext warm without deformities Or obvious joint restrictions  calf tenderness, cyanosis or clubbing    SKIN: warm and dry without lesions    NEURO:  alert, approp, nl sensorium with  no motor or cerebellar deficits apparent.      CXR PA and Lateral:   12/04/2023 :    I personally reviewed images and impression is as follows:     Assessment   Assessment & Plan DOE (dyspnea on exertion) Active smoker -PFT's 02/16/2015 nl flows/vols s rx prior to study with DLCO 55/58 % corrects to 74 % for alv volume.  - 12/04/2023   Walked on  RA  x   3   lap(s) =  approx 450  ft  @ mod fast pace, stopped due to end of sutdy  with lowest 02 sats 94%   and improved to 96% before stopping s sob   No evidence of copd or progressive ILD at this point with the main concern being ? Chronic asp based on es dsyfunction > w/u in progress and no additional w/u needed for now   Esophageal dysphagia DgEs 03/01/16   1. Mild esophageal dysmotility, likely presbyesophagus. 2. Small hiatal hernia. Similar appearance of a mild stricture at GE junction 3. Delayed passage of 13 mm barium tablet - Repeat Dg Es ordered 12/04/2023 >>>   I suspect most of his problems related to dysphagia which in turn is  exac  by GERD with prior h/o Barrett's but reported severe reaction to PPI (can't breathe) which pt does not recall.   Rec max gerd diet/ H2  rx and f/u with DgES aspap then back to GI for f/u    Mild persistent asthmatic bronchitis with acute exacerbation Active smoker - PFT's  02/16/2015 nl flows/vols s rx  prior to study with DLCO  55/58 % corrects to 74 % for alv volume   - Allergy  screen 12/04/2023 >  Eos 0. /  alpha one AT phenotype pending  - 12/04/2023  After extensive coaching inhaler device,  effectiveness =    75% from  baseline near 0 > continue saba prn  plus omnicef 300 mg bid x10 d and max mucolytics if tolerates   Re SABA :  I spent extra time with pt today reviewing appropriate use of albuterol  for prn use on exertion with the following points: 1) saba is for relief of sob that does not improve by walking a slower pace or resting but rather if the pt does not improve after trying this first. 2) If the pt is convinced, as many are, that saba helps recover from activity faster then it's easy to tell if this is the case by re-challenging : ie stop, take the inhaler, then p 5 minutes try the exact same activity (intensity of workload) that just caused the symptoms and see if they are substantially diminished or not after saba 3) if there is an activity that reproducibly causes the symptoms, try the saba 15 min before the activity on alternate days   If in fact the saba really does help, then fine to continue to use it prn but advised may need to look closer at the maintenance regimen being used (for now =0 as not convince needs maint rx)  to achieve better control of airways disease with exertion.    Cigarette smoker Counseled re importance of smoking cessation but did not meet time criteria for separate billing     Each maintenance medication was reviewed in detail including emphasizing most importantly the difference between maintenance and prns and under what circumstances the  prns are to be triggered using an action plan format where appropriate.  Total time for H and P, chart review, counseling, reviewing hfa device(s) , directly observing portions of ambulatory 02 saturation study/ and generating customized AVS unique to this office visit / same day charting = 61 min for pt not seen in > 3 y                 AVS  Patient Instructions  My office will be contacting you by phone for referral to [facility] for diagnostic  esophagram   - if you don't hear back from my office within one week please call us  back or notify us  thru MyChart and we'll address it right away.    Best cough medication > Robitussin DM  (per bottle)  or   mucinex dm (same medication but need to crush)  Ominicef 300  mg twice daily x 10 days  Use your albuterol  as a rescue medication to be used if you can't catch your breath by resting, slowing your pace,  or doing a relaxed purse lip breathing pattern.  - The less you use it, the better it will work when you need it. - Ok to use up to 2 puffs  every 4 hours if you must but call for  appointment if use goes up over your usual need - Don't leave home without it !!  (think of it like a spare tire or starter fluid for your car)   Also  [facility]  to try albuterol  15 min before an activity (on alternating days)  that you know would usually make you short of breath and see if it makes any difference and if makes none then don't take albuterol  after activity unless you can't catch your breath as this means it's the resting that helps, not the albuterol .   Work on inhaler technique:  relax and gently blow all the way out then take a nice smooth full deep breath back in, triggering the inhaler at same time you start breathing in.  Hold breath in for at least  5 seconds if you can.       Increase Pepcid  20mg  to take it after you get up and after supper   GERD (REFLUX)  is an extremely common cause of respiratory symptoms just like yours , many times with no  obvious heartburn at all.    It can be treated with medication, but also with lifestyle changes including elevation of the head of your bed (ideally with 6 -8inch blocks under the headboard of your bed),  Smoking cessation, avoidance of late meals, excessive alcohol, and avoid fatty foods, chocolate, peppermint, colas, red wine, and acidic juices such as orange juice.  NO MINT OR MENTHOL PRODUCTS SO NO COUGH DROPS  USE SUGARLESS CANDY INSTEAD (Jolley ranchers or Stover's or Life Savers) or even ice chips will also do - the key is to swallow to prevent all throat clearing. NO OIL BASED VITAMINS - use powdered substitutes.  Avoid fish oil when coughing.    Please schedule a follow up office visit in 6 weeks, call sooner if needed with all medications /inhalers/ solutions in hand so we can verify exactly what you are taking. This includes all medications from all doctors and over the counters                 Ozell America, MD 12/04/2023

## 2023-12-09 ENCOUNTER — Ambulatory Visit (HOSPITAL_COMMUNITY)
Admission: RE | Admit: 2023-12-09 | Discharge: 2023-12-09 | Disposition: A | Discharge status: Home / Self Care | Source: Ambulatory Visit | Attending: Internal Medicine | Admitting: Internal Medicine

## 2023-12-09 DIAGNOSIS — R1319 Other dysphagia: Secondary | ICD-10-CM | POA: Diagnosis present

## 2023-12-10 ENCOUNTER — Ambulatory Visit: Payer: Self-pay | Admitting: Internal Medicine

## 2023-12-10 DIAGNOSIS — R0609 Other forms of dyspnea: Secondary | ICD-10-CM

## 2023-12-10 LAB — BRAIN NATRIURETIC PEPTIDE: BNP: 20.5 pg/mL (ref 0.0–100.0)

## 2023-12-10 LAB — SEDIMENTATION RATE: Sed Rate: 7 mm/h (ref 0–30)

## 2023-12-10 LAB — ALPHA-1-ANTITRYPSIN PHENOTYP: A-1 Antitrypsin: 167 mg/dL (ref 101–187)

## 2023-12-11 ENCOUNTER — Ambulatory Visit

## 2023-12-11 NOTE — Progress Notes (Signed)
 Atc x1 lmtcb

## 2023-12-13 NOTE — Progress Notes (Signed)
 See esophagus xray results - relayed labs in that phone call

## 2023-12-13 NOTE — Progress Notes (Signed)
 Called and relayed results to pt - pt states he has a GI dr and will call to get appt. I also relayed labs . Pt confirmed understanding

## 2024-01-06 NOTE — Progress Notes (Unsigned)
 Patient name: [name] MRN: [id number] DOB: 14-Feb-1950 Sex: male  REASON FOR CONSULT: 6 month follow-up EVAR   HPI: [name] is a 73 y.o. male, with history of EVAR in 2012 by Dr. Laurence for an abdominal aortic aneurysm that presents for 6 month follow-up and ongoing surveillance.  The abdominal aneurysm has been decreasing in size on surveillance but we have been watching her right common iliac aneurysm.  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   Past Medical History:  Diagnosis Date   AAA (abdominal aortic aneurysm)    a. s/p EVAR 05/2010.   Back pain    Barrett's esophagus    CKD (chronic kidney disease), stage II    Degenerative disk disease    a. Chronic back pain with ridiculopathy.   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 3mm nodule seen on CT 09/2014 - f/u recommended 1 yr.   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   left forearm superior --> ED&C needed    Tobacco abuse     Past Surgical History:  Procedure Laterality Date   ABDOMINAL AORTIC ENDOVASCULAR STENT GRAFT  2014   APPENDECTOMY     BACK SURGERY     BIOPSY N/A 06/10/2013   Procedure: BIOPSY;  Surgeon: Claudis RAYMOND Rivet, MD;  Location: [facility];  Service: Endoscopy;  Laterality: N/A;   BIOPSY  12/03/2014   Procedure: BIOPSY (Gastroesophageal Junction);  Surgeon: Claudis RAYMOND Rivet, MD;  Location: [facility];  Service: Endoscopy;;   ESOPHAGEAL DILATION N/A 12/03/2014   Procedure: ESOPHAGEAL DILATION WITH 54FR AND 56FR MALONEY;  Surgeon: Claudis RAYMOND Rivet, MD;  Location: [facility];  Service: Endoscopy;  Laterality: N/A;    ESOPHAGOGASTRODUODENOSCOPY (EGD) WITH PROPOFOL  N/A 06/10/2013   Procedure: ESOPHAGOGASTRODUODENOSCOPY (EGD) WITH PROPOFOL ;  Surgeon: Claudis RAYMOND Rivet, MD;  Location: [facility];  Service: Endoscopy;  Laterality: N/A;   ESOPHAGOGASTRODUODENOSCOPY (EGD) WITH PROPOFOL  N/A 12/03/2014   Procedure: ESOPHAGOGASTRODUODENOSCOPY (EGD) WITH PROPOFOL ;  Surgeon: Claudis RAYMOND Rivet, MD;  Location: [facility];  Service: Endoscopy;  Laterality: N/A;   EVAR  06/20/10   for AAA repair    LEFT HEART CATH AND CORONARY ANGIOGRAPHY N/A 02/04/2017   Procedure: LEFT HEART CATH AND CORONARY ANGIOGRAPHY;  Surgeon: Court Dorn PARAS, MD;  Location: [facility];  Service: Cardiovascular;  Laterality: N/A;   MALONEY DILATION N/A 06/10/2013   Procedure: MALONEY DILATION 52 fr, 54 fr;  Surgeon: Claudis RAYMOND Rivet, MD;  Location: [facility];  Service: Endoscopy;  Laterality: N/A;   SPINAL FUSION     of neck and lower back   VIDEO BRONCHOSCOPY Bilateral 02/24/2018   Procedure: VIDEO BRONCHOSCOPY WITH FLUORO;  Surgeon: [name], Praveen, MD;  Location: [facility];  Service: Cardiopulmonary;  Laterality: Bilateral;    Family History  Problem Relation Age of Onset   Atrial fibrillation Mother    Heart disease Mother        Patient unclear of what kind   Deep vein thrombosis Mother  Hyperlipidemia Mother    Hypertension Mother    Stroke Father    Diabetes Father    Heart disease Father        Patient unclear of what kind   Kidney disease Father    Hyperlipidemia Father    Hypertension Father    Diabetes Sister    Heart disease Sister        Patient unclear of what kind   Brain cancer Sister    Seizures Sister    Cancer Paternal Grandmother    Deep vein thrombosis Other    Pulmonary embolism Other    Lung disease Neg Hx    Colon cancer Neg Hx    Esophageal cancer Neg Hx    Rectal cancer Neg Hx    Stomach cancer Neg Hx     SOCIAL HISTORY: Social History   Socioeconomic History   Marital status: Married    Spouse name:  Not on file   Number of children: Not on file   Years of education: Not on file   Highest education level: 10th grade  Occupational History   Not on file  Tobacco Use   Smoking status: Every Day    Current packs/day: 1.00    Average packs/day: 1 pack/day for 58.1 years (58.1 ttl pk-yrs)    Types: Cigarettes    Start date: 12/16/1965   Smokeless tobacco: Never  Vaping Use   Vaping status: Never Used  Substance and Sexual Activity   Alcohol use: No    Alcohol/week: 0.0 standard drinks of alcohol   Drug use: No   Sexual activity: Not on file  Other Topics Concern   Not on file  Social History Narrative   Originally from [place], [place]. Always lived in [place]. Travel to the [place] in March 2016. Has traveled to [place], [place], [place], & [place]. Previously has worked sport and exercise psychologist. Has exposure to fumes from glue. 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 No known asbestos exposure. Has 2 parakeets currently. Remotely raised parakeets, love birds, & cockatiels in a different house. No known mold or hot tub exposure.    Social Drivers of Health   Tobacco Use: High Risk (12/04/2023)   Patient History    Smoking Tobacco Use: Every Day    Smokeless Tobacco Use: Never    Passive Exposure: Not on file  Financial Resource Strain: Low Risk (12/08/2023)   Overall Financial Resource Strain (CARDIA)    Difficulty of Paying Living Expenses: Not hard at all  Food Insecurity: No Food Insecurity (12/08/2023)   Epic    Worried About Programme Researcher, Broadcasting/film/video in the Last Year: Never true    Ran Out of Food in the Last Year: Never true  Transportation Needs: No Transportation Needs (12/08/2023)   Epic    Lack of Transportation (Medical): No    Lack of Transportation (Non-Medical): No  Physical Activity: Sufficiently Active (12/08/2023)   Exercise Vital Sign    Days of Exercise per Week: 6 days    Minutes of Exercise per Session: 60 min  Stress: No Stress Concern Present (12/08/2023)   [employer] - Occupational Stress Questionnaire    Feeling of Stress: Not at all  Social Connections: Moderately Integrated (12/08/2023)   Social Connection and Isolation Panel    Frequency of Communication with Friends and Family: More than three times a week    Frequency of Social Gatherings with Friends and Family: Twice a week  Attends Religious Services: More than 4 times per year    Active Member of Clubs or Organizations: No    Attends Banker Meetings: Not on file    Marital Status: Married  Catering Manager Violence: Not on file  Depression (PHQ2-9): Low Risk (11/28/2023)   Depression (PHQ2-9)    PHQ-2 Score: 1  Alcohol Screen: Not on file  Housing: Low Risk (12/08/2023)   Epic    Unable to Pay for Housing in the Last Year: No    Number of Times Moved in the Last Year: 0    Homeless in the Last Year: No  Utilities: Not on file  Health Literacy: Not on file    Allergies  Allergen Reactions   Benazepril Hcl Shortness Of Breath and Other (See Comments)    Can't breathe   Pantoprazole Sodium Shortness Of Breath and Other (See Comments)    Can't breathe   Amlodipine  Swelling and Other (See Comments)    Location of swelling not recalled by the patient    Current Outpatient Medications  Medication Sig Dispense Refill   albuterol  (PROAIR  HFA) 108 (90 Base) MCG/ACT inhaler Inhale 2 puffs into the lungs every 6 (six) hours as needed. 8.5 g 3   aspirin  EC 81 MG tablet Take 81 mg by mouth daily.     cefdinir  (OMNICEF ) 300 MG capsule Take 1 capsule (300 mg total) by mouth 2 (two) times daily. 20 capsule 0   famotidine  (PEPCID ) 20 MG tablet Take 1 tablet (20 mg total) by mouth daily. 90 tablet 1   loratadine  (CLARITIN ) 10 MG tablet Take 1 tablet (10 mg total) by mouth daily. (Patient taking differently: Take 10 mg by mouth daily as needed for allergies or rhinitis.) 30 tablet 11   losartan  (COZAAR ) 50 MG tablet Take 1 tablet (50 mg total) by  mouth daily. 90 tablet 1   Oxycodone  HCl 10 MG TABS Take 10 mg by mouth 5 (five) times daily as needed.     rosuvastatin  (CRESTOR ) 5 MG tablet Take 1 tablet (5 mg total) by mouth daily. 90 tablet 1   No current facility-administered medications for this visit.    REVIEW OF SYSTEMS:  [X]  denotes positive finding, [ ]  denotes negative finding Cardiac  Comments:  Chest pain or chest pressure:    Shortness of breath upon exertion:    Short of breath when lying flat:    Irregular heart rhythm:        Vascular    Pain in calf, thigh, or hip brought on by ambulation:    Pain in feet at night that wakes you up from your sleep:     Blood clot in your veins:    Leg swelling:         Pulmonary    Oxygen at home:    Productive cough:     Wheezing:         Neurologic    Sudden weakness in arms or legs:     Sudden numbness in arms or legs:     Sudden onset of difficulty speaking or slurred speech:    Temporary loss of vision in one eye:     Problems with dizziness:         Gastrointestinal    Blood in stool:     Vomited blood:         Genitourinary    Burning when urinating:     Blood in urine:  Psychiatric    Major depression:         Hematologic    Bleeding problems:    Problems with blood clotting too easily:        Skin    Rashes or ulcers:        Constitutional    Fever or chills:      PHYSICAL EXAM: There were no vitals filed for this visit.   GENERAL: The patient is a well-nourished male, in no acute distress. The vital signs are documented above. CARDIAC: There is a regular rate and rhythm.  VASCULAR:  Bilateral femoral pulses palpable Bilateral DP pulses palpable PULMONARY: No respiratory distress. ABDOMEN: Soft and non-tender. MUSCULOSKELETAL: There are no major deformities or cyanosis.   DATA:   EVAR duplex today shows maximal aortic diameter 4 cm and unchanged  CTA reviewed 01/07/23 with endograft in good position.  There is complete  shrinkage of the aneurysm sac with no leakage of contrast into the abdominal aneurysm.  There is again a right common iliac aneurysm with no seal of the distal limb although the contrast does not make it into the aneurysm sac.  The right common iliac aneurysm measures 22 mm and is unchanged   Assessment/Plan:  73 y.o. male, with history of EVAR in 2012 by Dr. Laurence for an abdominal aortic aneurysm that presents for 6 month follow-up and ongoing surveillance.  The abdominal aneurysm has been decreasing in size on surveillance but we have been watching her right common iliac aneurysm.  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  Based on duplex today this now measures .  Again discussed these are generally repaired around 3 to 3.5 cm as we discussed today.  Will continue to follow with EVAR duplex in 6 months.  Discussed smoking cessation with aspirin  for cardiovascular risk reduction.   Lonni DOROTHA Gaskins, MD [facility] Office: [phone]

## 2024-01-07 ENCOUNTER — Ambulatory Visit: Admitting: Vascular Surgery

## 2024-01-07 ENCOUNTER — Encounter: Payer: Self-pay | Admitting: Vascular Surgery

## 2024-01-07 ENCOUNTER — Ambulatory Visit (HOSPITAL_COMMUNITY): Admission: RE | Admit: 2024-01-07 | Discharge: 2024-01-07 | Attending: Vascular Surgery

## 2024-01-07 VITALS — BP 121/80 | HR 64 | Temp 98.1°F | Resp 20 | Ht 68.0 in | Wt 173.9 lb

## 2024-01-07 DIAGNOSIS — I723 Aneurysm of iliac artery: Secondary | ICD-10-CM

## 2024-01-07 DIAGNOSIS — I714 Abdominal aortic aneurysm, without rupture, unspecified: Secondary | ICD-10-CM

## 2024-01-07 DIAGNOSIS — Z9889 Other specified postprocedural states: Secondary | ICD-10-CM | POA: Insufficient documentation

## 2024-01-08 ENCOUNTER — Other Ambulatory Visit: Payer: Self-pay

## 2024-01-08 ENCOUNTER — Telehealth: Payer: Self-pay

## 2024-01-08 DIAGNOSIS — I714 Abdominal aortic aneurysm, without rupture, unspecified: Secondary | ICD-10-CM

## 2024-01-08 NOTE — Telephone Encounter (Signed)
 Abstraction of pt's information that was received from Dr. Jerrell Sol. Mjp,lpn

## 2024-01-09 ENCOUNTER — Other Ambulatory Visit

## 2024-01-09 DIAGNOSIS — I10 Essential (primary) hypertension: Secondary | ICD-10-CM

## 2024-01-09 DIAGNOSIS — E782 Mixed hyperlipidemia: Secondary | ICD-10-CM

## 2024-01-21 ENCOUNTER — Ambulatory Visit (INDEPENDENT_AMBULATORY_CARE_PROVIDER_SITE_OTHER): Admitting: Internal Medicine

## 2024-01-21 ENCOUNTER — Encounter: Payer: Self-pay | Admitting: Internal Medicine

## 2024-01-21 VITALS — BP 130/73 | HR 71 | Ht 68.0 in | Wt 174.0 lb

## 2024-01-21 DIAGNOSIS — R0609 Other forms of dyspnea: Secondary | ICD-10-CM

## 2024-01-21 DIAGNOSIS — R1319 Other dysphagia: Secondary | ICD-10-CM

## 2024-01-21 DIAGNOSIS — R1314 Dysphagia, pharyngoesophageal phase: Secondary | ICD-10-CM | POA: Diagnosis not present

## 2024-01-21 DIAGNOSIS — J4531 Mild persistent asthma with (acute) exacerbation: Secondary | ICD-10-CM | POA: Diagnosis not present

## 2024-01-21 DIAGNOSIS — F17210 Nicotine dependence, cigarettes, uncomplicated: Secondary | ICD-10-CM

## 2024-01-21 MED ORDER — CEFDINIR 300 MG PO CAPS: 300.0000 mg | ORAL_CAPSULE | Freq: Two times a day (BID) | ORAL | 0 refills | Status: AC

## 2024-01-21 NOTE — Progress Notes (Unsigned)
 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    History of Present Illness  12/04/2023  Pulmonary/ 1st office eval/ [place] / [facility] active smoker / had been on nexium  but now listed as severe allergy  to protonix/ on pepcid  20 mg per day with overt HB  drinking lots of Dr Nunzio and eating chocolate Chief Complaint  Patient presents with   Establish Care    LCS  Couging w green mucus - shob   Dyspnea:  slow pace anywhere / no steps  Cough: eating or drinking but also happens  Sleep: on couch because bedroom is too cold / uses one pillow and arm rest wakes up most nights swallows Dr Nunzio then back to bed  SABA use: once or twice a day  02 use: none  Patient Instructions  My office will be contacting you by phone for referral to [facility] for diagnostic  esophagram    Best cough medication > Robitussin DM  (per bottle)  or   mucinex dm (same medication but need to crush) Ominicef 300  mg twice daily x 10 days Use your albuterol  as a rescue medication  Also  Ok to try albuterol  15 min before an activity (on alternating days)  that you know would usually make you short of breath Work on inhaler technique:  Increase Pepcid  20mg  to take it after you get up and after supper  GERD diet reviewed, bed blocks rec   Please schedule a follow up office visit in 6 weeks, call sooner if needed with all medications /inhalers/ solutions in hand    01/21/2024  f/u ov/ office/Kanna Dafoe re: AB/ PF maint on no rx   did not  bring meds  Chief Complaint  Patient presents with   Shortness of Breath    Coughing  white  F/u    Dyspnea:  better p saba but rarely uses  Cough: mucoid/ all day long  Sleeping: on couch sue to cold bedroom    resp cc  SABA use: 3-4 x daily   Lung cancer screening: overdue/ referred    No obvious day to day or daytime variability or assoc excess/ purulent sputum or mucus plugs or hemoptysis or cp or chest tightness, subjective wheeze or overt  hb symptoms.    Also denies any obvious fluctuation of symptoms with weather or environmental changes or other aggravating or alleviating factors except as outlined above   No unusual exposure hx or h/o childhood pna/ asthma or knowledge of premature birth.  Current Allergies, Complete Past Medical History, Past Surgical History, Family History, and Social History were reviewed in [employer] record.  ROS  The following are not active complaints unless bolded Hoarseness, sore throat, dysphagia, dental problems, itching, sneezing,  nasal congestion or discharge of excess mucus or purulent secretions, ear ache,   fever, chills, sweats, unintended wt loss or wt gain, classically pleuritic or exertional cp,  orthopnea pnd or arm/hand swelling  or leg swelling, presyncope, palpitations, abdominal pain,  anorexia, nausea, vomiting, diarrhea  or change in bowel habits or change in bladder habits, change in stools or change in urine, dysuria, hematuria,  rash, arthralgias, visual complaints, headache, numbness, weakness or ataxia or problems with walking or coordination,  change in mood or  memory.        Outpatient Medications Prior to Visit - - NOTE:   Unable to verify as accurately reflecting what pt takes    Medication Sig Dispense Refill   albuterol  (PROAIR  HFA) 108 (90 Base) MCG/ACT inhaler Inhale 2 puffs into the lungs every 6 (six) hours as needed. 8.5 g 3   aspirin  EC 81 MG tablet Take 81 mg by mouth daily.     famotidine  (PEPCID ) 20 MG tablet Take 1 tablet (20 mg total) by mouth daily. 90 tablet 1    loratadine  (CLARITIN ) 10 MG tablet Take 1 tablet (10 mg total) by mouth daily. (Patient taking differently: Take 10 mg by mouth daily as needed for allergies or rhinitis.) 30 tablet 11   losartan  (COZAAR ) 50 MG tablet Take 1 tablet (50 mg total) by mouth daily. 90 tablet 1   Oxycodone  HCl 10 MG TABS Take 10 mg by mouth 5 (five) times daily as needed.     rosuvastatin  (CRESTOR ) 5 MG tablet Take 1 tablet (5 mg total) by mouth daily. 90 tablet 1   cefdinir  (OMNICEF ) 300 MG capsule Take 1 capsule (300 mg total) by mouth 2 (two) times daily. 20 capsule 0   losartan -hydrochlorothiazide  (HYZAAR) 100-25 MG tablet Take 1 tablet by mouth daily. (Patient not taking: Reported on 01/21/2024)    Has symbicort  also ? Strength      Past Medical History:  Diagnosis Date   AAA (abdominal aortic aneurysm)    a. s/p EVAR 05/2010.   Back pain    Barrett's esophagus    CKD (chronic kidney disease), stage II    Degenerative disk disease    a. Chronic back pain with ridiculopathy.   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 3mm nodule seen on CT 09/2014 - f/u recommended 1 yr.   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   left forearm superior --> ED&C needed    Tobacco abuse       Objective:     Wt Readings from Last 3 Encounters:  01/21/24 174 lb (78.9 kg)  01/07/24 173 lb 14.4 oz (78.9 kg)  12/04/23 171 lb (77.6 kg)      Vital signs reviewed  01/21/2024  - Note at rest 02 sats  96% on RA   General appearance:    somber amb wm, slt grff voice        HEENT : Oropharynx  clear/ edentulous   Nasal turbinates mod edema L > R    NECK :   without  apparent JVD/ palpable Nodes/TM    LUNGS: no acc muscle use,  Min barrel  contour chest wall with bilateral insp/exp rhonchi  and  without cough on insp or exp maneuvers and min  Hyperresonant  to  percussion bilaterally    CV:  RRR  no s3 or murmur or increase in P2, and no edema   ABD:  soft and nontender    MS:  Nl gait/ ext warm without deformities Or obvious joint restrictions  calf tenderness, cyanosis or clubbing     SKIN: warm and dry without lesions    NEURO:  alert, approp, nl sensorium with  no motor or cerebellar deficits apparent.             Assessment            "

## 2024-01-21 NOTE — Patient Instructions (Addendum)
 Stop pepcid  and start prilosec 20 mg  (over the counter) Take 30- 60 min before your first and last meals of the day and report to your GI doctor whether it helped the swallowing or the coughing.  Plan A = Automatic = Always=    Symbicort  Take 2 puffs first thing in am and then another 2 puffs about 12 hours later.    Work on inhaler technique:  relax and gently blow all the way out then take a nice smooth full deep breath back in, triggering the inhaler at same time you start breathing in.  Hold breath in for at least  5 seconds if you can. Blow out symbiort  thru nose. Rinse and gargle with water  when done.  If mouth or throat bother you at all,  try brushing teeth/gums/tongue with arm and hammer toothpaste/ make a slurry and gargle and spit out.     Plan B = Backup (to supplement plan A, not to replace it) Use your albuterol  inhaler as a rescue medication to be used if you can't catch your breath by resting or slowing your pace  or doing a relaxed purse lip breathing pattern.  - The less you use it, the better it will work when you need it. - Ok to use the inhaler up to 2 puffs  every 4 hours if you must but call for appointment if use goes up over your usual need - Don't leave home without it !!  (think of it like the spare tire or starter fluid for your car)   Also  Ok to try albuterol  15 min before an activity (on alternating days)  that you know would usually make you short of breath and see if it makes any difference and if makes none then don't take albuterol  after activity unless you can't catch your breath as this means it's the resting that helps, not the albuterol .  My office will be contacting you by phone for referral to lung cancer screening   [phone]) - if you don't hear back from my office within one week,  please call us  back or notify us  thru MyChart and we'll address it right away.  The key is to stop smoking completely before smoking completely stops you!    omnicef   300 mg twice x 15 days   Please schedule a follow up visit in 3 months with PFTs same day  -  call sooner if needed    Return with all RESPIRATOREY medications /inhalers/ solutions in hand so we can verify exactly what you are taking. This includes all medications from all doctors and over the counters

## 2024-01-23 NOTE — Assessment & Plan Note (Addendum)
 DgEs 03/01/16   1. Mild esophageal dysmotility, likely presbyesophagus. 2. Small hiatal hernia. Similar appearance of a mild stricture at GE junction 3. Delayed passage of 13 mm barium tablet - REpeat Dg Es  12/09/23 1. Mild narrowing at the gastroesophageal junction. Barium tablet was stuck at this location and findings are suggestive for a stricture. 2.  Mild esophageal dysmotility.  3.  Small hiatal hernia.  Rec  >>> change pepcid  to prilosec 20 mg  bid ac   >>>  referred  to GI 01/21/2024

## 2024-01-23 NOTE — Assessment & Plan Note (Addendum)
 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 - 12/04/2023  After extensive coaching inhaler device,  effectiveness =    75% from  baseline near 0 > continue saba prn  plus omnicef  300 mg bid x10 d and max mucinex - 01/21/2024  After extensive coaching inhaler device,  effectiveness =    80% with hfa  >>>  continue symbicort  in whatever stength  he has with more approp saba use  Re SABA :  I spent extra time with pt today reviewing appropriate use of albuterol  for prn use on exertion with the following points: 1) saba is for relief of sob that does not improve by walking a slower pace or resting but rather if the pt does not improve after trying this first. 2) If the pt is convinced, as many are, that saba helps recover from activity faster then it's easy to tell if this is the case by re-challenging : ie stop, take the inhaler, then p 5 minutes try the exact same activity (intensity of workload) that just caused the symptoms and see if they are substantially diminished or not after saba 3) if there is an activity that reproducibly causes the symptoms, try the saba 15 min before the activity on alternate days   If in fact the saba really does help, then fine to continue to use it prn but advised may need to look closer at the maintenance regimen (symbicort )  being used to achieve better control of airways disease with exertion.    >>> return with pfts and in meantime try omnicef  x 2 weeks at his request (always helps the cough)

## 2024-01-23 NOTE — Assessment & Plan Note (Addendum)
 Counseled re importance of smoking cessation but did not meet time criteria for separate billing    Low-dose CT lung cancer screening is recommended for patients who are 58-72 years of age with a 20+ pack-year history of smoking and who are currently smoking or quit <=15 years ago. No coughing up blood  No unintentional weight loss of > 15 pounds in the last 6 months - pt is eligible for scanning yearly until age 68 > referred 01/21/2024         Each RESP  maintenance medication was reviewed in detail including emphasizing most importantly the difference between maintenance and prns and under what circumstances the prns are to be triggered using an action plan format where appropriate.  Total time for H and P, chart review, counseling, reviewing hfa  device(s) and generating customized AVS unique to this office visit / same day charting = 33 min

## 2024-01-23 NOTE — Assessment & Plan Note (Addendum)
 Active smoker/MM -PFT's 02/16/2015 nl flows/vols s rx prior to study with DLCO 55/58 % corrects to 74 % for alv volume.  - 12/04/2023   Walked on  RA  x   3   lap(s) =  approx 450  ft  @ mod fast pace, stopped due to end of sutdy  with lowest 02 sats 94%   and improved to 96% before stopping s sob  -  Alpha One AT phenotype 12/04/23 MM level 167   -  BNP  12/04/23  21   >>> PFTs 01/21/2024 ordered

## 2024-01-29 ENCOUNTER — Telehealth: Payer: Self-pay

## 2024-01-29 DIAGNOSIS — Z87891 Personal history of nicotine dependence: Secondary | ICD-10-CM

## 2024-01-29 DIAGNOSIS — F17210 Nicotine dependence, cigarettes, uncomplicated: Secondary | ICD-10-CM

## 2024-01-29 DIAGNOSIS — Z122 Encounter for screening for malignant neoplasm of respiratory organs: Secondary | ICD-10-CM

## 2024-01-29 NOTE — Telephone Encounter (Signed)
 Lung Cancer Screening Narrative/Criteria Questionnaire (Cigarette Smokers Only- No Cigars/Pipes/vapes)   [name]   SDMV:02/03/2024 at  9:30 am [name]        Jul 28, 1950               LDCT: 02/06/2024 at 9:00 am [facility]    74 y.o.   Phone: [phone]  Lung Screening Narrative (confirm age 56-77 yrs Medicare / 50-80 yrs Private pay insurance)   Insurance information: [employer] Medicare   Referring Provider: Darlean, MD   This screening involves an initial phone call with a team member from our program. It is called a shared decision making visit. The initial meeting is required by  insurance and Medicare to make sure you understand the program. This appointment takes about 15-20 minutes to complete. You will complete the screening scan at your scheduled date/time.  This scan takes about 5-10 minutes to complete. You can eat and drink normally before and after the scan.  Criteria questions for Lung Cancer Screening:   Are you a current or former smoker? Current Age began smoking: 15   If you are a former smoker, what year did you quit smoking? Quit 4 years and started back. (within 15 yrs)   To calculate your smoking history, I need an accurate estimate of how many packs of cigarettes you smoked per day and for how many years. (Not just the number of PPD you are now smoking)   Years smoking 58 x Packs per day 1.5 = Pack years 87   (at least 20 pack yrs)   (Make sure they understand that we need to know how much they have smoked in the past, not just the number of PPD they are smoking now)  Do you have a personal history of cancer?  Yes - (type and when diagnosed - 5 yrs cancer free) Skin cancer    Do you have a family history of cancer? Yes  (cancer type and and relative) Paternal grandmother had unknown type.   Are you coughing up blood?  No  Have you had unexplained weight loss of 15 lbs or more in the last 6 months? No  It looks like you meet all criteria.  When would be a good time  for us  to schedule you for this screening?   Additional information: N/A

## 2024-02-03 ENCOUNTER — Encounter: Payer: Self-pay | Admitting: Adult Health

## 2024-02-03 ENCOUNTER — Ambulatory Visit: Admitting: Adult Health

## 2024-02-03 DIAGNOSIS — F17210 Nicotine dependence, cigarettes, uncomplicated: Secondary | ICD-10-CM

## 2024-02-03 NOTE — Progress Notes (Signed)
" °  Virtual Visit via Telephone Note  I connected with [name] , 02/03/2024 9:14 AM by a telemedicine application and verified that I am speaking with the correct person using two identifiers.  Location: Patient: home Provider: home   I discussed the limitations of evaluation and management by telemedicine and the availability of in person appointments. The patient expressed understanding and agreed to proceed.   Shared Decision Making Visit Lung Cancer Screening Program [phone])   Eligibility: 74 y.o. Pack Years Smoking History Calculation = 87 pack years (# packs/per year x # years smoked) Recent History of coughing up blood  no Unexplained weight loss? no ( >Than 15 pounds within the last 6 months ) Prior History Lung / other cancer no (Diagnosis within the last 5 years already requiring surveillance chest CT Scans). Smoking Status Current Smoker  Visit Components: Discussion included one or more decision making aids. YES Discussion included risk/benefits of screening. YES Discussion included potential follow up diagnostic testing for abnormal scans. YES Discussion included meaning and risk of over diagnosis. YES Discussion included meaning and risk of False Positives. YES Discussion included meaning of total radiation exposure. YES  Counseling Included: Importance of adherence to annual lung cancer LDCT screening. YES Impact of comorbidities on ability to participate in the program. YES Ability and willingness to under diagnostic treatment. YES  Smoking Cessation Counseling: Current Smokers:  Discussed importance of smoking cessation. yes Information about tobacco cessation classes and interventions provided to patient. yes Patient provided with ticket for LDCT Scan. yes Symptomatic Patient. NO Diagnosis Code: Tobacco Use Z72.0 Asymptomatic Patient yes  Counseling - 4 minutes of smoking cessation counseling  Smoking/Tobacco Cessation Counseling [name]  is a current user of tobacco or nicotine  products. He is ready to quit at this time. Counseling provided today addressed the risks of continued use and the benefits of cessation. Discussed tobacco/nicotine  use history, readiness to quit, and evidence-based treatment options including behavioral strategies, support resources, and pharmacologic therapies. Provided encouragement and educational materials on steps and resources to quit smoking. Patient questions were addressed, and follow-up recommended for continued support. Total time spent on counseling: 3 minutes.    Z12.2-Screening of respiratory organs Z87.891-Personal history of nicotine  dependence   [name] 02/03/2024        "

## 2024-02-03 NOTE — Patient Instructions (Signed)

## 2024-02-06 ENCOUNTER — Ambulatory Visit
Admission: RE | Admit: 2024-02-06 | Discharge: 2024-02-06 | Disposition: A | Source: Ambulatory Visit | Attending: Acute Care | Admitting: Acute Care

## 2024-02-06 DIAGNOSIS — Z122 Encounter for screening for malignant neoplasm of respiratory organs: Secondary | ICD-10-CM

## 2024-02-06 DIAGNOSIS — F17210 Nicotine dependence, cigarettes, uncomplicated: Secondary | ICD-10-CM

## 2024-02-06 DIAGNOSIS — Z87891 Personal history of nicotine dependence: Secondary | ICD-10-CM

## 2024-02-17 ENCOUNTER — Telehealth: Payer: Self-pay

## 2024-02-17 NOTE — Telephone Encounter (Signed)
 Spoke with patient and reviewed recent LDCT results, Rad 0. Pt stated he was on antibiotics at time of scan. He was seen by Dr. Darlean 01/21/2024 and started on treatment. Symptoms have now returned. He has a productive cough with green sputum. Pt has been scheduled to see Dr. Darlean this Wednesday at 1:30 pm. Pt is aware that he will get a call closer to 04/06/2024 to confirm symptoms have resolved so that we can schedule a follow up scan as his most recent scan was read as incomplete. Results and plan to PCP. Order placed.   Isaiah, RN

## 2024-02-17 NOTE — Telephone Encounter (Addendum)
 IMPRESSION: 1. Basilar dependent ground-glass, superimposed on known interstitial lung disease, with two new areas of nodular consolidation in the right upper lobe. Lung-RADS 0, incomplete. Recommend follow-up low-dose lung cancer screening CT in 1-2 months in further evaluation as malignancy cannot be excluded. These results will be called to the ordering clinician or representative by the Radiologist Assistant, and communication documented in the PACS or [employer]. 2. Aortic atherosclerosis (ICD10-I70.0). Coronary artery calcification. 3.  Emphysema (ICD10-J43.9).

## 2024-02-19 ENCOUNTER — Ambulatory Visit (HOSPITAL_COMMUNITY)
Admission: RE | Admit: 2024-02-19 | Discharge: 2024-02-19 | Disposition: A | Discharge status: Home / Self Care | Source: Ambulatory Visit | Attending: Internal Medicine | Admitting: Internal Medicine

## 2024-02-19 ENCOUNTER — Encounter: Payer: Self-pay | Admitting: Internal Medicine

## 2024-02-19 ENCOUNTER — Ambulatory Visit: Admitting: Internal Medicine

## 2024-02-19 VITALS — BP 162/100 | HR 66 | Ht 68.0 in | Wt 175.0 lb

## 2024-02-19 DIAGNOSIS — F17210 Nicotine dependence, cigarettes, uncomplicated: Secondary | ICD-10-CM

## 2024-02-19 DIAGNOSIS — J4531 Mild persistent asthma with (acute) exacerbation: Secondary | ICD-10-CM

## 2024-02-19 DIAGNOSIS — R0609 Other forms of dyspnea: Secondary | ICD-10-CM | POA: Diagnosis present

## 2024-02-19 DIAGNOSIS — R1319 Other dysphagia: Secondary | ICD-10-CM

## 2024-02-19 MED ORDER — RABEPRAZOLE SODIUM 20 MG PO TBEC: DELAYED_RELEASE_TABLET | ORAL | 2 refills | Status: AC

## 2024-02-19 MED ORDER — AZITHROMYCIN 250 MG PO TABS: ORAL_TABLET | ORAL | 0 refills | Status: AC

## 2024-02-19 MED ORDER — PREDNISONE 10 MG PO TABS: ORAL_TABLET | ORAL | 0 refills | Status: AC

## 2024-02-19 NOTE — Assessment & Plan Note (Addendum)
 DgEs 03/01/16   1. Mild esophageal dysmotility, likely presbyesophagus. 2. Small hiatal hernia. Similar appearance of a mild stricture at GE junction 3. Delayed passage of 13 mm barium tablet - REpeat Dg Es  12/09/23 1. Mild narrowing at the gastroesophageal junction. Barium tablet was stuck at this location and findings are suggestive for a stricture. 2.  Mild esophageal dysmotility.  3.  Small hiatal hernia.>>>  referred  to GI   Not seen as of 02/19/2024 and only taking pepcid  20 mg daily   Has h/o allergy  to Protonix but should tol acidphex ok as it is such a different molecule >>> start aciphex  20  mg q am ac

## 2024-02-19 NOTE — Assessment & Plan Note (Addendum)
 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 - 12/04/2023  After extensive coaching inhaler device,  effectiveness =    75% from  baseline near 0 > continue saba prn  plus omnicef  300 mg bid x10 d and max mucinex - 01/21/2024  After extensive coaching inhaler device,  effectiveness =    80% with hfa > continue symbicort  in whatever stength  he has with more approp saba use   >>> zpak   >>> Prednisone  10 mg take  4 each am x 2 days,   2 each am x 2 days,  1 each am x 2 days and stop   >>> continue symbicort  80 2bid   >>>for cough mucinex dm 1200 mg bid supplemt with oxycodone  10 prn       Each maintenance medication was reviewed in detail including emphasizing most importantly the difference between maintenance and prns and under what circumstances the prns are to be triggered using an action plan format where appropriate.  Total time for H and P, chart review, counseling, reviewing hfa device(s) and generating customized AVS unique to this office visit / same day charting = 40 min   For patient with multiple chronic  refractory respiratory  symptoms of uncertain etiology

## 2024-02-19 NOTE — Assessment & Plan Note (Addendum)
 4  min discussion re active cigarette smoking in addition to office E&M  Ask about tobacco use:   ongoing  Advise quitting   I took an extended  opportunity with this patient to outline the consequences of continued cigarette use  in airway disorders based on all the data we have from the multiple national lung health studies (perfomed over decades at millions of dollars in cost)  indicating that smoking cessation, not choice of inhalers or pulmonary physicians, is the most important aspect of his care- stopping should have an immediate benefit in terms of cough which is typical of CB clinically  Assess willingness:  Not committed at this point Assist in quit attempt:  Per PCP when ready Arrange follow up:   Follow up per Primary Care planned

## 2024-02-19 NOTE — Assessment & Plan Note (Addendum)
 Active smoker/MM -PFT's 02/16/2015 nl flows/vols s rx prior to study with DLCO 55/58 % corrects to 74 % for alv volume.  - 12/04/2023   Walked on  RA  x   3   lap(s) =  approx 450  ft  @ mod fast pace, stopped due to end of sutdy  with lowest 02 sats 94%   and improved to 96% before stopping s sob  -  Alpha One AT phenotype 12/04/23 MM level 167   -  BNP  12/04/23  21  - PFTs 01/21/2024 ordered   Symptoms are markedly disproportionate to objective findings and not clear to what extent this is actually a pulmonary  problem but pt does appear to have difficult to sort out respiratory symptoms of unknown origin for which  DDX  = almost all start with A and  include Adherence, Ace Inhibitors, Acid Reflux, Active Sinus Disease, Alpha 1 Antitripsin deficiency, Anxiety masquerading as Airways dz,  ABPA,  Allergy (esp in young), Aspiration (esp in elderly), Adverse effects of meds,  Active smoking or Vaping, A bunch of PE's/clot burden (a few small clots can't cause this syndrome unless there is already severe underlying pulm or vascular dz with poor reserve),  Anemia or thyroid  disorder, plus two Bs  = Bronchiectasis and Beta blocker use..and one C= CHF    Every one of the ddx considered here/ BNP already proved to be nl/ already treating ashtma and ruled out Alpha one  so check doe labs/ cxr  but no obvious clues based on hx or exam and plans to return to complete the w/u in March but asked to call sooner if not responding to rx as per AVS

## 2024-02-19 NOTE — Patient Instructions (Addendum)
 Aciphex  20 mg x  Take  30-60 min before first meal of the day and Pepcid  (famotidine )  20 mg after supper until return to office - this is the best way to tell whether stomach acid is contributing to your problem.    Aciphex  is a different molecule from protonix which you could not tolerate  GERD (REFLUX)  is an extremely common cause of respiratory symptoms just like yours , many times with no obvious heartburn at all.    It can be treated with medication, but also with lifestyle changes including elevation of the head of your bed (ideally with 6 -8inch blocks under the headboard of your bed),  Smoking cessation, avoidance of late meals, excessive alcohol, and avoid fatty foods, chocolate, peppermint, colas, red wine, and acidic juices such as orange juice.  NO MINT OR MENTHOL PRODUCTS SO NO COUGH DROPS (Luden's cough drops ok =PECTIN USE SUGARLESS CANDY INSTEAD (Jolley ranchers or Stover's or Environmental Manager) or even ice chips will also do - the key is to swallow to prevent all throat clearing. NO OIL BASED VITAMINS - use powdered substitutes.  Avoid fish oil when coughing.   Zpak   Prednisone  10 mg take  4 each am x 2 days,   2 each am x 2 days,  1 each am x 2 days and stop   For cough/ congestion >  mucinex dm  up to maximum of  1200 mg every 12 hours as needed   Please remember to go to the lab department   for your tests - we will call you with the results when they are available.  Keep your previous appt   Please remember to go to the  x-ray department  @  [facility] for your tests - we will call you with the results when they are available

## 2024-02-19 NOTE — Progress Notes (Addendum)
 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    History of Present Illness  12/04/2023  Pulmonary/ 1st office eval/ [name] / [facility] active smoker / had been on nexium  but now listed as severe allergy  to protonix/ on pepcid  20 mg per day with overt HB  drinking lots of Dr Nunzio and eating chocolate Chief Complaint  Patient presents with   Establish Care    LCS  Couging w green mucus - shob   Dyspnea:  slow pace anywhere / no steps  Cough: eating or drinking but also happens  Sleep: on couch because bedroom is too cold / uses one pillow and arm rest wakes up most nights swallows Dr Nunzio then back to bed  SABA use: once or twice a day  02 use: none  Patient Instructions  My office will be contacting you by phone for referral to [facility] for diagnostic  esophagram    Best cough medication > Robitussin DM  (per bottle)  or   mucinex dm (same medication but need to crush) Ominicef 300  mg twice daily x 10 days Use your albuterol  as a rescue medication  Also  Ok to try albuterol  15 min before an activity (on alternating days)  that you know would usually make you short of breath Work on inhaler technique:  Increase Pepcid  20mg  to take it after you get up and after supper  GERD diet reviewed, bed blocks rec   Please schedule a follow up office visit in 6 weeks, call sooner if needed with all medications /inhalers/ solutions in hand      01/21/2024  f/u ov/[place] office/[name] re: AB/ PF maint on no rx   did not  bring meds  Chief Complaint  Patient presents with   Shortness of Breath    Coughing white  F/u    Dyspnea:  thinks he's  better p saba but rarely uses pre exertion  Cough: mucoid/ all day long  Sleeping: on couch due to cold bedroom   SABA use: 3-4 x daily  Lung cancer screening: overdue/ referred  Patient Instructions  Stop pepcid  and start prilosec 20 mg  (over the counter) Take 30- 60 min before your first and last meals of the day and report to your GI doctor whether it helped the swallowing or the coughing. Plan A = Automatic = Always=    Symbicort  Take 2 puffs first thing in am and then another 2 puffs about 12 hours later.  Work on inhaler technique:    Plan B = Backup (to supplement plan A, not to replace it) Use your albuterol  inhaler as a rescue medication  Also  Ok to try albuterol  15 min before an activity (on alternating days)  that you know would usually make you short of breath   My office will be contacting you by phone for referral to lung cancer screening   [phone]) The key is to stop smoking completely before smoking completely stops you!  omnicef  300 mg twice x 15 days  Please schedule a  follow up visit in 3 months with PFTs same day  -  call sooner if needed   Return with all RESPIRATORY medications /inhalers/ solutions in hand     02/19/2024  f/u ov/[place] office/[name] re: cough  maint on symbicort   82 did not bring meds  Dyspnea:  having to walk at slower pace  Cough: worse since early January louder and greener and worse at hs  Sleeping: too much / flat bed 2 pillows  and cough all night  SABA use: none  02: none  Lung cancer screening: in program  No fever/ nose is running x several months esp  Migratory cp ant but does not hurt to cough and not linked to exertion   No obvious day to day or daytime variability or assoc excess/ purulent sputum or mucus plugs or hemoptysis or cp or chest tightness, subjective wheeze or overt sinus or hb symptoms.    Also denies any obvious fluctuation of symptoms with weather or environmental changes or other aggravating or alleviating  factors except as outlined above   No unusual exposure hx or h/o childhood pna/ asthma or knowledge of premature birth.  Current Allergies, Complete Past Medical History, Past Surgical History, Family History, and Social History were reviewed in [employer] record.  ROS  The following are not active complaints unless bolded Hoarseness, sore throat, dysphagia, dental problems, itching, sneezing,  nasal congestion or discharge of excess mucus or purulent secretions, ear ache,   fever, chills, sweats, unintended wt loss or wt gain, classically pleuritic or exertional cp,  orthopnea pnd or arm/hand swelling  or leg swelling, presyncope, palpitations, abdominal pain, anorexia, nausea, vomiting, diarrhea  or change in bowel habits or change in bladder habits, change in stools or change in urine, dysuria, hematuria,  rash, arthralgias, visual complaints, headache, numbness, weakness or ataxia or problems with walking or coordination,  change in mood or  memory.         Outpatient Medications Prior to Visit  Medication Sig Dispense Refill   albuterol  (PROAIR  HFA) 108 (90 Base) MCG/ACT inhaler Inhale 2 puffs into the lungs every 6 (six) hours as needed. 8.5 g 3   aspirin  EC 81 MG tablet Take 81 mg by mouth daily.     cefdinir  (OMNICEF ) 300 MG capsule Take 1 capsule (300 mg total) by mouth 2 (two) times daily. 30 capsule 0   famotidine  (PEPCID ) 20 MG tablet Take 1 tablet (20 mg total) by mouth daily. 90 tablet 1   loratadine  (CLARITIN ) 10 MG tablet Take 1 tablet (10 mg total) by mouth daily. (Patient taking differently: Take 10 mg by mouth daily as needed for allergies or rhinitis.) 30 tablet 11   losartan  (COZAAR ) 50 MG tablet Take 1 tablet (50 mg total) by mouth daily. 90 tablet 1   Oxycodone  HCl 10 MG TABS Take 10 mg by mouth 5 (five) times daily as needed.     rosuvastatin  (CRESTOR ) 5 MG tablet Take 1 tablet (5 mg total) by mouth daily. 90 tablet 1   losartan -hydrochlorothiazide   (HYZAAR) 100-25 MG tablet Take 1 tablet by mouth daily. (Patient not taking: Reported on 02/19/2024)     No facility-administered medications prior to visit.        Past Medical History:  Diagnosis Date   AAA (abdominal aortic aneurysm)    a. s/p EVAR 05/2010.   Back pain    Barrett's esophagus    CKD (chronic kidney disease), stage II    Degenerative disk  disease    a. Chronic back pain with ridiculopathy.   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 3mm nodule seen on CT 09/2014 - f/u recommended 1 yr.   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   left forearm superior --> ED&C needed    Tobacco abuse       Objective:    Wts  02/19/2024       175   01/21/24 174 lb (78.9 kg)  01/07/24 173 lb 14.4 oz (78.9 kg)  12/04/23 171 lb (77.6 kg)    Vital signs reviewed  02/19/2024  - Note at rest 02 sats  95% on RA and BP  up after missing am rx   General appearance:    amb somber wm/ mildly congested sounding cough    HEENT : Oropharynx  clear/ edentulous  Nasal turbinates nl    NECK :  without  apparent JVD/ palpable Nodes/TM    LUNGS: no acc muscle use,  Min barrel  contour chest wall with bilateral  slightly decreased bs s audible wheeze and  with cough/wheeze on forced exp maneuvers and min  Hyperresonant  to  percussion bilaterally    CV:  RRR  no s3 or murmur or increase in P2, and no edema   ABD:  soft and nontender    MS:  Nl gait/ ext warm without deformities Or obvious joint restrictions  calf tenderness, cyanosis or clubbing     SKIN: warm and dry without lesions    NEURO:  alert, approp, nl sensorium with   no motor or cerebellar deficits apparent.       CXR PA and Lateral:   02/19/2024 :    I personally reviewed images and impression is as follows:           Assessment   Assessment & Plan DOE (dyspnea on exertion) Active smoker/MM -PFT's 02/16/2015 nl flows/vols s rx prior to study with DLCO 55/58 % corrects to 74 % for alv volume.  - 12/04/2023   Walked on  RA  x   3   lap(s) =  approx 450  ft  @ mod fast pace, stopped due to end of sutdy  with lowest 02 sats 94%   and improved to 96% before stopping s sob  -  Alpha One AT phenotype 12/04/23 MM level 167   -  BNP  12/04/23  21  - PFTs 01/21/2024 ordered   Symptoms are markedly disproportionate to objective findings and not clear to what extent this is actually a pulmonary  problem but pt does appear to have difficult to sort out respiratory symptoms of unknown origin for which  DDX  = almost all start with A and  include Adherence, Ace Inhibitors, Acid Reflux, Active Sinus Disease, Alpha 1 Antitripsin deficiency, Anxiety masquerading as Airways dz,  ABPA,  Allergy (esp in young), Aspiration (esp in elderly), Adverse effects of meds,  Active smoking or Vaping, A bunch of PE's/clot burden (a few small clots can't cause this syndrome unless there is already severe  underlying pulm or vascular dz with poor reserve),  Anemia or thyroid  disorder, plus two Bs  = Bronchiectasis and Beta blocker use..and one C= CHF    Every one of the ddx considered here/ BNP already proved to be nl/ already treating ashtma and ruled out Alpha one  so check doe labs/ cxr  but no obvious clues based on hx or exam and plans to return to complete the w/u in March but asked to call sooner if not responding to rx as per AVS    Esophageal dysphagia DgEs 03/01/16   1. Mild esophageal dysmotility, likely presbyesophagus. 2. Small hiatal hernia. Similar appearance of a mild stricture at GE junction 3. Delayed passage of 13 mm barium tablet - REpeat Dg Es  12/09/23 1. Mild  narrowing at the gastroesophageal junction. Barium tablet was stuck at this location and findings are suggestive for a stricture. 2.  Mild esophageal dysmotility.  3.  Small hiatal hernia.>>>  referred  to GI   Not seen as of 02/19/2024 and only taking pepcid  20 mg daily   Has h/o allergy  to Protonix but should tol acidphex ok as it is such a different molecule >>> start aciphex  20  mg q am ac    Mild persistent asthmatic bronchitis with acute exacerbation 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 - 12/04/2023  After extensive coaching inhaler device,  effectiveness =    75% from  baseline near 0 > continue saba prn  plus omnicef  300 mg bid x10 d and max mucinex - 01/21/2024  After extensive coaching inhaler device,  effectiveness =    80% with hfa > continue symbicort  in whatever stength  he has with more approp saba use   >>> zpak   >>> Prednisone  10 mg take  4 each am x 2 days,   2 each am x 2 days,  1 each am x 2 days and stop   >>> continue symbicort  80 2bid   >>>for cough mucinex dm 1200 mg bid supplemt with oxycodone  10 prn       Each maintenance medication was reviewed in detail including emphasizing most importantly the difference between maintenance and prns and under what circumstances the prns are to be triggered using an action plan format where appropriate.  Total time for H and P, chart review, counseling, reviewing hfa device(s) and generating customized AVS unique to this office visit / same day charting = 40 min   For patient with multiple chronic  refractory respiratory  symptoms of uncertain etiology        Cigarette smoker 4  min discussion re active cigarette smoking in addition to office E&M  Ask about tobacco use:   ongoing  Advise quitting   I took an extended  opportunity with this patient to  outline the consequences of continued cigarette use  in airway disorders based on all the data we have from the multiple national lung health studies (perfomed over decades at millions of dollars in cost)  indicating that smoking cessation, not choice of inhalers or pulmonary physicians, is the most important aspect of his care- stopping should have an immediate benefit in terms of cough which is typical of CB clinically  Assess willingness:  Not committed at this point Assist in quit  attempt:  Per PCP when ready Arrange follow up:   Follow up per Primary Care planned         AVS  Patient Instructions  Aciphex  20 mg x  Take  30-60 min before first meal of the day and Pepcid  (famotidine )  20 mg after supper until return to office - this is the best way to tell whether stomach acid is contributing to your problem.    Aciphex  is a different molecule from protonix which you could not tolerate  GERD (REFLUX)  is an extremely common cause of respiratory symptoms just like yours , many times with no obvious heartburn at all.    It can be treated with medication, but also with lifestyle changes including elevation of the head of your bed (ideally with 6 -8inch blocks under the headboard of your bed),  Smoking cessation, avoidance of late meals, excessive alcohol, and avoid fatty foods, chocolate, peppermint, colas, red wine, and acidic juices such as orange juice.  NO MINT OR MENTHOL PRODUCTS SO NO COUGH DROPS (Luden's cough drops ok =PECTIN USE SUGARLESS CANDY INSTEAD (Jolley ranchers or Stover's or Environmental Manager) or even ice chips will also do - the key is to swallow to prevent all throat clearing. NO OIL BASED VITAMINS - use powdered substitutes.  Avoid fish oil when coughing.   Zpak   Prednisone  10 mg take  4 each am x 2 days,   2 each am x 2 days,  1 each am x 2 days and stop   For cough/ congestion >  mucinex dm  up to maximum of  1200 mg every 12 hours as needed   Please remember to go to the  lab department   for your tests - we will call you with the results when they are available.  Keep your previous appt   Please remember to go to the  x-ray department  @  [facility] for your tests - we will call you with the results when they are available      Ozell America, MD 02/19/2024                                   "

## 2024-02-20 ENCOUNTER — Ambulatory Visit: Payer: Self-pay | Admitting: Internal Medicine

## 2024-02-20 LAB — CBC WITH DIFFERENTIAL/PLATELET
Basophils Absolute: 0.1 10*3/uL (ref 0.0–0.2)
Basos: 1 %
EOS (ABSOLUTE): 0.4 10*3/uL (ref 0.0–0.4)
Eos: 5 %
Hematocrit: 45.8 % (ref 37.5–51.0)
Hemoglobin: 15.7 g/dL (ref 13.0–17.7)
Immature Grans (Abs): 0.1 10*3/uL (ref 0.0–0.1)
Immature Granulocytes: 1 %
Lymphocytes Absolute: 4.6 10*3/uL — ABNORMAL HIGH (ref 0.7–3.1)
Lymphs: 54 %
MCH: 30.9 pg (ref 26.6–33.0)
MCHC: 34.3 g/dL (ref 31.5–35.7)
MCV: 90 fL (ref 79–97)
Monocytes Absolute: 0.8 10*3/uL (ref 0.1–0.9)
Monocytes: 10 %
Neutrophils Absolute: 2.4 10*3/uL (ref 1.4–7.0)
Neutrophils: 29 %
Platelets: 237 10*3/uL (ref 150–450)
RBC: 5.08 x10E6/uL (ref 4.14–5.80)
RDW: 12.8 % (ref 11.6–15.4)
WBC: 8.3 10*3/uL (ref 3.4–10.8)

## 2024-02-20 LAB — BASIC METABOLIC PANEL WITH GFR
BUN/Creatinine Ratio: 10 (ref 10–24)
BUN: 13 mg/dL (ref 8–27)
CO2: 23 mmol/L (ref 20–29)
Calcium: 9 mg/dL (ref 8.6–10.2)
Chloride: 103 mmol/L (ref 96–106)
Creatinine, Ser: 1.26 mg/dL (ref 0.76–1.27)
Glucose: 58 mg/dL — ABNORMAL LOW (ref 70–99)
Potassium: 4.5 mmol/L (ref 3.5–5.2)
Sodium: 139 mmol/L (ref 134–144)
eGFR: 61 mL/min/{1.73_m2}

## 2024-02-20 LAB — TSH: TSH: 1.68 u[IU]/mL (ref 0.450–4.500)

## 2024-02-20 LAB — SEDIMENTATION RATE: Sed Rate: 15 mm/h (ref 0–30)

## 2024-02-28 ENCOUNTER — Other Ambulatory Visit

## 2024-02-28 DIAGNOSIS — E782 Mixed hyperlipidemia: Secondary | ICD-10-CM

## 2024-02-28 DIAGNOSIS — I10 Essential (primary) hypertension: Secondary | ICD-10-CM

## 2024-03-02 ENCOUNTER — Ambulatory Visit: Admitting: Family Medicine

## 2024-04-14 ENCOUNTER — Ambulatory Visit: Admitting: Internal Medicine

## 2024-04-14 ENCOUNTER — Encounter (HOSPITAL_COMMUNITY)

## 2024-05-13 ENCOUNTER — Ambulatory Visit: Admitting: Dermatology

## 2024-07-14 ENCOUNTER — Ambulatory Visit (HOSPITAL_COMMUNITY)

## 2024-07-14 ENCOUNTER — Ambulatory Visit: Admitting: Vascular Surgery
